# Patient Record
Sex: Male | Born: 1950 | Race: White | Hispanic: No | Marital: Married | Smoking: Never smoker
Health system: Southern US, Community
[De-identification: ages and names within clinical notes are randomized; demographics above are authoritative.]

## PROBLEM LIST (undated history)

## (undated) DIAGNOSIS — R7302 Impaired glucose tolerance (oral): Secondary | ICD-10-CM

## (undated) DIAGNOSIS — E785 Hyperlipidemia, unspecified: Secondary | ICD-10-CM

## (undated) DIAGNOSIS — R339 Retention of urine, unspecified: Secondary | ICD-10-CM

## (undated) DIAGNOSIS — B356 Tinea cruris: Secondary | ICD-10-CM

## (undated) DIAGNOSIS — J069 Acute upper respiratory infection, unspecified: Secondary | ICD-10-CM

## (undated) DIAGNOSIS — M545 Low back pain: Secondary | ICD-10-CM

## (undated) DIAGNOSIS — G4700 Insomnia, unspecified: Secondary | ICD-10-CM

## (undated) DIAGNOSIS — E739 Lactose intolerance, unspecified: Secondary | ICD-10-CM

## (undated) DIAGNOSIS — J019 Acute sinusitis, unspecified: Secondary | ICD-10-CM

## (undated) DIAGNOSIS — R232 Flushing: Secondary | ICD-10-CM

## (undated) DIAGNOSIS — K219 Gastro-esophageal reflux disease without esophagitis: Secondary | ICD-10-CM

## (undated) DIAGNOSIS — R42 Dizziness and giddiness: Secondary | ICD-10-CM

## (undated) DIAGNOSIS — F419 Anxiety disorder, unspecified: Secondary | ICD-10-CM

## (undated) DIAGNOSIS — F329 Major depressive disorder, single episode, unspecified: Secondary | ICD-10-CM

## (undated) DIAGNOSIS — R61 Generalized hyperhidrosis: Secondary | ICD-10-CM

## (undated) DIAGNOSIS — K5730 Diverticulosis of large intestine without perforation or abscess without bleeding: Secondary | ICD-10-CM

## (undated) DIAGNOSIS — R6882 Decreased libido: Secondary | ICD-10-CM

## (undated) DIAGNOSIS — B370 Candidal stomatitis: Secondary | ICD-10-CM

## (undated) DIAGNOSIS — E291 Testicular hypofunction: Secondary | ICD-10-CM

## (undated) DIAGNOSIS — F988 Other specified behavioral and emotional disorders with onset usually occurring in childhood and adolescence: Secondary | ICD-10-CM

## (undated) DIAGNOSIS — R972 Elevated prostate specific antigen [PSA]: Secondary | ICD-10-CM

## (undated) DIAGNOSIS — R109 Unspecified abdominal pain: Secondary | ICD-10-CM

## (undated) DIAGNOSIS — Z8719 Personal history of other diseases of the digestive system: Secondary | ICD-10-CM

## (undated) DIAGNOSIS — Z9089 Acquired absence of other organs: Secondary | ICD-10-CM

## (undated) DIAGNOSIS — R21 Rash and other nonspecific skin eruption: Secondary | ICD-10-CM

## (undated) DIAGNOSIS — N410 Acute prostatitis: Secondary | ICD-10-CM

## (undated) DIAGNOSIS — G4733 Obstructive sleep apnea (adult) (pediatric): Secondary | ICD-10-CM

## (undated) DIAGNOSIS — R300 Dysuria: Secondary | ICD-10-CM

## (undated) DIAGNOSIS — H6690 Otitis media, unspecified, unspecified ear: Secondary | ICD-10-CM

## (undated) DIAGNOSIS — R310 Gross hematuria: Secondary | ICD-10-CM

## (undated) DIAGNOSIS — L280 Lichen simplex chronicus: Secondary | ICD-10-CM

## (undated) DIAGNOSIS — K921 Melena: Secondary | ICD-10-CM

## (undated) HISTORY — DX: Gross hematuria: R31.0

## (undated) HISTORY — PX: HERNIA REPAIR: SHX51

## (undated) HISTORY — DX: Gastro-esophageal reflux disease without esophagitis: K21.9

## (undated) HISTORY — DX: Unspecified abdominal pain: R10.9

## (undated) HISTORY — DX: Tinea cruris: B35.6

## (undated) HISTORY — DX: Rash and other nonspecific skin eruption: R21

## (undated) HISTORY — DX: Low back pain: M54.5

## (undated) HISTORY — DX: Hyperlipidemia, unspecified: E78.5

## (undated) HISTORY — DX: Flushing: R23.2

## (undated) HISTORY — DX: Other specified behavioral and emotional disorders with onset usually occurring in childhood and adolescence: F98.8

## (undated) HISTORY — DX: Acute sinusitis, unspecified: J01.90

## (undated) HISTORY — PX: TONSILLECTOMY: SUR1361

## (undated) HISTORY — DX: Insomnia, unspecified: G47.00

## (undated) HISTORY — DX: Major depressive disorder, single episode, unspecified: F32.9

## (undated) HISTORY — DX: Elevated prostate specific antigen (PSA): R97.20

## (undated) HISTORY — DX: Acute upper respiratory infection, unspecified: J06.9

## (undated) HISTORY — DX: Diverticulosis of large intestine without perforation or abscess without bleeding: K57.30

## (undated) HISTORY — DX: Personal history of other diseases of the digestive system: Z87.19

## (undated) HISTORY — DX: Obstructive sleep apnea (adult) (pediatric): G47.33

## (undated) HISTORY — DX: Dizziness and giddiness: R42

## (undated) HISTORY — PX: CHOLECYSTECTOMY: SHX55

## (undated) HISTORY — DX: Decreased libido: R68.82

## (undated) HISTORY — DX: Acute prostatitis: N41.0

## (undated) HISTORY — DX: Generalized hyperhidrosis: R61

## (undated) HISTORY — PX: EYE SURGERY: SHX253

## (undated) HISTORY — DX: Acquired absence of other organs: Z90.89

## (undated) HISTORY — DX: Melena: K92.1

## (undated) HISTORY — DX: Dysuria: R30.0

## (undated) HISTORY — DX: Testicular hypofunction: E29.1

## (undated) HISTORY — DX: Lichen simplex chronicus: L28.0

## (undated) HISTORY — DX: Otitis media, unspecified, unspecified ear: H66.90

## (undated) HISTORY — DX: Lactose intolerance, unspecified: E73.9

## (undated) HISTORY — DX: Candidal stomatitis: B37.0

## (undated) HISTORY — DX: Impaired glucose tolerance (oral): R73.02

## (undated) HISTORY — DX: Retention of urine, unspecified: R33.9

---
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
## 1985-05-21 HISTORY — PX: VASECTOMY: SHX75

## 2003-05-22 HISTORY — PX: CHOLECYSTECTOMY: SHX55

## 2003-12-02 ENCOUNTER — Encounter: Payer: Self-pay | Admitting: Gastroenterology

## 2003-12-02 ENCOUNTER — Ambulatory Visit (HOSPITAL_COMMUNITY): Admission: RE | Admit: 2003-12-02 | Discharge: 2003-12-02 | Payer: Self-pay | Admitting: Gastroenterology

## 2003-12-08 ENCOUNTER — Encounter: Payer: Self-pay | Admitting: Gastroenterology

## 2003-12-08 ENCOUNTER — Ambulatory Visit (HOSPITAL_COMMUNITY): Admission: RE | Admit: 2003-12-08 | Discharge: 2003-12-08 | Payer: Self-pay | Admitting: Family Medicine

## 2004-02-18 ENCOUNTER — Encounter: Payer: Self-pay | Admitting: Gastroenterology

## 2004-02-18 LAB — HM COLONOSCOPY

## 2004-08-31 ENCOUNTER — Ambulatory Visit: Payer: Self-pay | Admitting: Internal Medicine

## 2004-09-27 ENCOUNTER — Ambulatory Visit: Payer: Self-pay | Admitting: Pulmonary Disease

## 2004-10-01 ENCOUNTER — Encounter: Payer: Self-pay | Admitting: Pulmonary Disease

## 2004-10-01 ENCOUNTER — Ambulatory Visit (HOSPITAL_BASED_OUTPATIENT_CLINIC_OR_DEPARTMENT_OTHER): Admission: RE | Admit: 2004-10-01 | Discharge: 2004-10-01 | Payer: Self-pay | Admitting: Pulmonary Disease

## 2004-10-18 ENCOUNTER — Ambulatory Visit: Payer: Self-pay | Admitting: Pulmonary Disease

## 2004-10-25 ENCOUNTER — Ambulatory Visit: Payer: Self-pay | Admitting: Pulmonary Disease

## 2004-12-11 ENCOUNTER — Ambulatory Visit: Payer: Self-pay | Admitting: Pulmonary Disease

## 2005-04-04 ENCOUNTER — Ambulatory Visit: Payer: Self-pay | Admitting: Gastroenterology

## 2005-10-26 ENCOUNTER — Ambulatory Visit: Payer: Self-pay | Admitting: Internal Medicine

## 2005-10-31 ENCOUNTER — Ambulatory Visit: Payer: Self-pay | Admitting: Internal Medicine

## 2006-01-18 ENCOUNTER — Ambulatory Visit: Payer: Self-pay | Admitting: Internal Medicine

## 2006-02-28 ENCOUNTER — Ambulatory Visit: Payer: Self-pay | Admitting: Internal Medicine

## 2006-03-12 ENCOUNTER — Ambulatory Visit (HOSPITAL_COMMUNITY): Admission: RE | Admit: 2006-03-12 | Discharge: 2006-03-12 | Payer: Self-pay | Admitting: Internal Medicine

## 2006-12-19 ENCOUNTER — Ambulatory Visit: Payer: Self-pay | Admitting: Internal Medicine

## 2006-12-19 LAB — CONVERTED CEMR LAB
Albumin: 4.2 g/dL (ref 3.5–5.2)
BUN: 14 mg/dL (ref 6–23)
Basophils Absolute: 0 10*3/uL (ref 0.0–0.1)
Basophils Relative: 0.7 % (ref 0.0–1.0)
Bilirubin Urine: NEGATIVE
Cholesterol: 187 mg/dL (ref 0–200)
Creatinine, Ser: 1 mg/dL (ref 0.4–1.5)
Eosinophils Absolute: 0.2 10*3/uL (ref 0.0–0.6)
GFR calc Af Amer: 100 mL/min
HDL: 33.5 mg/dL — ABNORMAL LOW (ref >39.0)
Hemoglobin: 14.8 g/dL (ref 13.0–17.0)
Ketones, ur: NEGATIVE mg/dL
Lymphocytes Relative: 22.3 % (ref 12.0–46.0)
MCHC: 35.9 g/dL (ref 30.0–36.0)
Monocytes Absolute: 0.5 10*3/uL (ref 0.2–0.7)
Monocytes Relative: 7.8 % (ref 3.0–11.0)
Neutro Abs: 4.2 10*3/uL (ref 1.4–7.7)
Nitrite: NEGATIVE
PSA: 2.92 ng/mL
PSA: 2.92 ng/mL (ref 0.10–4.00)
Platelets: 288 10*3/uL (ref 150–400)
Potassium: 4.3 meq/L (ref 3.5–5.1)
Sodium: 139 meq/L (ref 135–145)
Specific Gravity, Urine: 1.025 (ref 1.000–1.03)
TSH: 1.09 microintl units/mL (ref 0.35–5.50)
Total Bilirubin: 1.5 mg/dL — ABNORMAL HIGH (ref 0.3–1.2)
Triglycerides: 118 mg/dL (ref 0–149)
Urine Glucose: NEGATIVE mg/dL
Urobilinogen, UA: 1 (ref 0.0–1.0)
VLDL: 24 mg/dL (ref 0–40)
pH: 6 (ref 5.0–8.0)

## 2007-01-07 ENCOUNTER — Ambulatory Visit: Payer: Self-pay | Admitting: Internal Medicine

## 2007-01-17 ENCOUNTER — Encounter: Payer: Self-pay | Admitting: Internal Medicine

## 2007-01-17 DIAGNOSIS — Z8719 Personal history of other diseases of the digestive system: Secondary | ICD-10-CM | POA: Insufficient documentation

## 2007-01-17 DIAGNOSIS — Z9089 Acquired absence of other organs: Secondary | ICD-10-CM | POA: Insufficient documentation

## 2007-01-17 DIAGNOSIS — F3289 Other specified depressive episodes: Secondary | ICD-10-CM

## 2007-01-17 DIAGNOSIS — F988 Other specified behavioral and emotional disorders with onset usually occurring in childhood and adolescence: Secondary | ICD-10-CM

## 2007-01-17 DIAGNOSIS — F329 Major depressive disorder, single episode, unspecified: Secondary | ICD-10-CM

## 2007-01-17 HISTORY — DX: Other specified behavioral and emotional disorders with onset usually occurring in childhood and adolescence: F98.8

## 2007-01-17 HISTORY — DX: Personal history of other diseases of the digestive system: Z87.19

## 2007-01-17 HISTORY — DX: Acquired absence of other organs: Z90.89

## 2007-01-17 HISTORY — DX: Other specified depressive episodes: F32.89

## 2007-01-17 HISTORY — DX: Major depressive disorder, single episode, unspecified: F32.9

## 2007-01-20 DIAGNOSIS — M545 Low back pain, unspecified: Secondary | ICD-10-CM

## 2007-01-20 DIAGNOSIS — E785 Hyperlipidemia, unspecified: Secondary | ICD-10-CM

## 2007-01-20 DIAGNOSIS — K219 Gastro-esophageal reflux disease without esophagitis: Secondary | ICD-10-CM | POA: Insufficient documentation

## 2007-01-20 HISTORY — DX: Hyperlipidemia, unspecified: E78.5

## 2007-01-20 HISTORY — DX: Low back pain, unspecified: M54.50

## 2007-02-07 ENCOUNTER — Ambulatory Visit: Payer: Self-pay | Admitting: Internal Medicine

## 2007-02-07 LAB — CONVERTED CEMR LAB
ALT: 31 units/L (ref 0–53)
AST: 24 units/L (ref 0–37)
Albumin: 4.2 g/dL (ref 3.5–5.2)
HDL: 33.3 mg/dL — ABNORMAL LOW (ref >39.0)
Total Bilirubin: 0.8 mg/dL (ref 0.3–1.2)
VLDL: 15 mg/dL (ref 0–40)

## 2007-03-18 ENCOUNTER — Telehealth (INDEPENDENT_AMBULATORY_CARE_PROVIDER_SITE_OTHER): Payer: Self-pay | Admitting: *Deleted

## 2007-04-03 ENCOUNTER — Telehealth (INDEPENDENT_AMBULATORY_CARE_PROVIDER_SITE_OTHER): Payer: Self-pay | Admitting: *Deleted

## 2007-04-04 ENCOUNTER — Ambulatory Visit: Payer: Self-pay | Admitting: Internal Medicine

## 2007-04-04 DIAGNOSIS — N410 Acute prostatitis: Secondary | ICD-10-CM

## 2007-04-04 DIAGNOSIS — G4733 Obstructive sleep apnea (adult) (pediatric): Secondary | ICD-10-CM | POA: Insufficient documentation

## 2007-04-04 DIAGNOSIS — G4700 Insomnia, unspecified: Secondary | ICD-10-CM | POA: Insufficient documentation

## 2007-04-04 DIAGNOSIS — E739 Lactose intolerance, unspecified: Secondary | ICD-10-CM

## 2007-04-04 DIAGNOSIS — B370 Candidal stomatitis: Secondary | ICD-10-CM

## 2007-04-04 DIAGNOSIS — J019 Acute sinusitis, unspecified: Secondary | ICD-10-CM

## 2007-04-04 HISTORY — DX: Acute prostatitis: N41.0

## 2007-04-04 HISTORY — DX: Obstructive sleep apnea (adult) (pediatric): G47.33

## 2007-04-04 HISTORY — DX: Insomnia, unspecified: G47.00

## 2007-04-04 HISTORY — DX: Lactose intolerance, unspecified: E73.9

## 2007-04-04 HISTORY — DX: Candidal stomatitis: B37.0

## 2007-04-04 HISTORY — DX: Acute sinusitis, unspecified: J01.90

## 2007-04-06 DIAGNOSIS — K5730 Diverticulosis of large intestine without perforation or abscess without bleeding: Secondary | ICD-10-CM | POA: Insufficient documentation

## 2007-04-06 HISTORY — DX: Diverticulosis of large intestine without perforation or abscess without bleeding: K57.30

## 2007-08-07 ENCOUNTER — Telehealth (INDEPENDENT_AMBULATORY_CARE_PROVIDER_SITE_OTHER): Payer: Self-pay | Admitting: *Deleted

## 2007-09-05 ENCOUNTER — Telehealth: Payer: Self-pay | Admitting: Internal Medicine

## 2007-09-30 ENCOUNTER — Ambulatory Visit: Payer: Self-pay | Admitting: Internal Medicine

## 2007-09-30 DIAGNOSIS — R109 Unspecified abdominal pain: Secondary | ICD-10-CM

## 2007-09-30 DIAGNOSIS — R61 Generalized hyperhidrosis: Secondary | ICD-10-CM

## 2007-09-30 HISTORY — DX: Unspecified abdominal pain: R10.9

## 2007-09-30 HISTORY — DX: Generalized hyperhidrosis: R61

## 2007-09-30 LAB — CONVERTED CEMR LAB
Ketones, ur: NEGATIVE mg/dL
Specific Gravity, Urine: 1.025 (ref 1.000–1.03)
Urine Glucose: NEGATIVE mg/dL
pH: 6 (ref 5.0–8.0)

## 2007-10-01 ENCOUNTER — Encounter: Admission: RE | Admit: 2007-10-01 | Discharge: 2007-10-01 | Payer: Self-pay | Admitting: Internal Medicine

## 2008-01-08 ENCOUNTER — Ambulatory Visit: Payer: Self-pay | Admitting: Internal Medicine

## 2008-01-08 ENCOUNTER — Telehealth (INDEPENDENT_AMBULATORY_CARE_PROVIDER_SITE_OTHER): Payer: Self-pay | Admitting: *Deleted

## 2008-01-09 LAB — CONVERTED CEMR LAB
ALT: 32 units/L (ref 0–53)
Basophils Absolute: 0.1 10*3/uL (ref 0.0–0.1)
Bilirubin Urine: NEGATIVE
Bilirubin, Direct: 0.2 mg/dL (ref 0.0–0.3)
CO2: 28 meq/L (ref 19–32)
Calcium: 8.7 mg/dL (ref 8.4–10.5)
HDL: 28.7 mg/dL — ABNORMAL LOW (ref >39.0)
Hemoglobin, Urine: NEGATIVE
Hemoglobin: 14.5 g/dL (ref 13.0–17.0)
Ketones, ur: NEGATIVE mg/dL
LDL Cholesterol: 67 mg/dL (ref 0–99)
Leukocytes, UA: NEGATIVE
Lymphocytes Relative: 23 % (ref 12.0–46.0)
MCHC: 35.8 g/dL (ref 30.0–36.0)
Neutro Abs: 3.3 10*3/uL (ref 1.4–7.7)
Neutrophils Relative %: 62.6 % (ref 43.0–77.0)
PSA: 2.03 ng/mL (ref 0.10–4.00)
RDW: 11.9 % (ref 11.5–14.6)
Sodium: 139 meq/L (ref 135–145)
TSH: 1.21 microintl units/mL (ref 0.35–5.50)
Total Bilirubin: 0.8 mg/dL (ref 0.3–1.2)
Total CHOL/HDL Ratio: 4.1
Triglycerides: 108 mg/dL (ref 0–149)
Urobilinogen, UA: 0.2 (ref 0.0–1.0)
VLDL: 22 mg/dL (ref 0–40)

## 2008-01-14 ENCOUNTER — Ambulatory Visit: Payer: Self-pay | Admitting: Internal Medicine

## 2008-01-15 ENCOUNTER — Telehealth (INDEPENDENT_AMBULATORY_CARE_PROVIDER_SITE_OTHER): Payer: Self-pay | Admitting: *Deleted

## 2008-05-04 ENCOUNTER — Telehealth (INDEPENDENT_AMBULATORY_CARE_PROVIDER_SITE_OTHER): Payer: Self-pay | Admitting: *Deleted

## 2008-05-24 ENCOUNTER — Telehealth (INDEPENDENT_AMBULATORY_CARE_PROVIDER_SITE_OTHER): Payer: Self-pay | Admitting: *Deleted

## 2008-05-31 ENCOUNTER — Ambulatory Visit: Payer: Self-pay | Admitting: Internal Medicine

## 2008-05-31 DIAGNOSIS — B356 Tinea cruris: Secondary | ICD-10-CM

## 2008-05-31 DIAGNOSIS — L280 Lichen simplex chronicus: Secondary | ICD-10-CM

## 2008-05-31 HISTORY — DX: Tinea cruris: B35.6

## 2008-05-31 HISTORY — DX: Lichen simplex chronicus: L28.0

## 2008-07-09 ENCOUNTER — Telehealth (INDEPENDENT_AMBULATORY_CARE_PROVIDER_SITE_OTHER): Payer: Self-pay | Admitting: *Deleted

## 2008-07-26 ENCOUNTER — Ambulatory Visit: Payer: Self-pay | Admitting: Internal Medicine

## 2008-07-26 DIAGNOSIS — K921 Melena: Secondary | ICD-10-CM

## 2008-07-26 HISTORY — DX: Melena: K92.1

## 2008-12-02 ENCOUNTER — Ambulatory Visit: Payer: Self-pay | Admitting: Internal Medicine

## 2008-12-02 DIAGNOSIS — R6882 Decreased libido: Secondary | ICD-10-CM

## 2008-12-02 HISTORY — DX: Decreased libido: R68.82

## 2008-12-03 LAB — CONVERTED CEMR LAB
ALT: 45 units/L (ref 0–53)
AST: 35 units/L (ref 0–37)
BUN: 15 mg/dL (ref 6–23)
Basophils Relative: 0.5 % (ref 0.0–3.0)
Chloride: 103 meq/L (ref 96–112)
Cholesterol: 147 mg/dL (ref 0–200)
Eosinophils Relative: 2.5 % (ref 0.0–5.0)
FSH: 10.1 milliintl units/mL (ref 1.4–18.1)
HCT: 41.8 % (ref 39.0–52.0)
Hemoglobin, Urine: NEGATIVE
Hemoglobin: 14.7 g/dL (ref 13.0–17.0)
Leukocytes, UA: NEGATIVE
Lymphs Abs: 1.2 10*3/uL (ref 0.7–4.0)
MCV: 98.2 fL (ref 78.0–100.0)
Monocytes Absolute: 0.4 10*3/uL (ref 0.1–1.0)
Nitrite: NEGATIVE
PSA: 2.57 ng/mL (ref 0.10–4.00)
Potassium: 4 meq/L (ref 3.5–5.1)
RBC: 4.25 M/uL (ref 4.22–5.81)
Sodium: 138 meq/L (ref 135–145)
Testosterone: 177.46 ng/dL — ABNORMAL LOW (ref 350.00–890.00)
Total CHOL/HDL Ratio: 4
Total Protein, Urine: NEGATIVE mg/dL
Total Protein: 7.5 g/dL (ref 6.0–8.3)
Triglycerides: 120 mg/dL (ref 0.0–149.0)
WBC: 5.5 10*3/uL (ref 4.5–10.5)
pH: 6 (ref 5.0–8.0)

## 2008-12-06 ENCOUNTER — Telehealth (INDEPENDENT_AMBULATORY_CARE_PROVIDER_SITE_OTHER): Payer: Self-pay | Admitting: *Deleted

## 2008-12-13 ENCOUNTER — Telehealth (INDEPENDENT_AMBULATORY_CARE_PROVIDER_SITE_OTHER): Payer: Self-pay | Admitting: *Deleted

## 2009-01-27 ENCOUNTER — Encounter: Payer: Self-pay | Admitting: Internal Medicine

## 2009-01-27 ENCOUNTER — Ambulatory Visit: Payer: Self-pay | Admitting: Internal Medicine

## 2009-01-27 DIAGNOSIS — R310 Gross hematuria: Secondary | ICD-10-CM

## 2009-01-27 DIAGNOSIS — R339 Retention of urine, unspecified: Secondary | ICD-10-CM

## 2009-01-27 DIAGNOSIS — R300 Dysuria: Secondary | ICD-10-CM

## 2009-01-27 HISTORY — DX: Retention of urine, unspecified: R33.9

## 2009-01-27 HISTORY — DX: Dysuria: R30.0

## 2009-01-27 HISTORY — DX: Gross hematuria: R31.0

## 2009-01-27 LAB — CONVERTED CEMR LAB
BUN: 16 mg/dL (ref 6–23)
CO2: 29 meq/L (ref 19–32)
Creatinine, Ser: 1 mg/dL (ref 0.4–1.5)
Nitrite: NEGATIVE
Potassium: 3.9 meq/L (ref 3.5–5.1)
Sodium: 137 meq/L (ref 135–145)
Total Protein, Urine: NEGATIVE mg/dL
Urine Glucose: NEGATIVE mg/dL
pH: 5.5 (ref 5.0–8.0)

## 2009-01-28 ENCOUNTER — Encounter: Payer: Self-pay | Admitting: Internal Medicine

## 2009-01-28 ENCOUNTER — Telehealth (INDEPENDENT_AMBULATORY_CARE_PROVIDER_SITE_OTHER): Payer: Self-pay | Admitting: *Deleted

## 2009-02-01 ENCOUNTER — Telehealth: Payer: Self-pay | Admitting: Internal Medicine

## 2009-02-01 ENCOUNTER — Ambulatory Visit: Payer: Self-pay | Admitting: Cardiology

## 2009-04-05 ENCOUNTER — Telehealth: Payer: Self-pay | Admitting: Internal Medicine

## 2009-04-12 ENCOUNTER — Ambulatory Visit: Payer: Self-pay | Admitting: Internal Medicine

## 2009-06-13 ENCOUNTER — Ambulatory Visit: Payer: Self-pay | Admitting: Internal Medicine

## 2009-06-13 DIAGNOSIS — H6690 Otitis media, unspecified, unspecified ear: Secondary | ICD-10-CM | POA: Insufficient documentation

## 2009-06-13 DIAGNOSIS — E291 Testicular hypofunction: Secondary | ICD-10-CM

## 2009-06-13 DIAGNOSIS — R42 Dizziness and giddiness: Secondary | ICD-10-CM | POA: Insufficient documentation

## 2009-06-13 HISTORY — DX: Testicular hypofunction: E29.1

## 2009-06-13 HISTORY — DX: Otitis media, unspecified, unspecified ear: H66.90

## 2009-06-13 HISTORY — DX: Dizziness and giddiness: R42

## 2009-06-20 ENCOUNTER — Telehealth: Payer: Self-pay | Admitting: Internal Medicine

## 2009-06-30 ENCOUNTER — Telehealth: Payer: Self-pay | Admitting: Internal Medicine

## 2009-07-14 ENCOUNTER — Telehealth (INDEPENDENT_AMBULATORY_CARE_PROVIDER_SITE_OTHER): Payer: Self-pay | Admitting: *Deleted

## 2009-08-26 ENCOUNTER — Ambulatory Visit: Payer: Self-pay | Admitting: Internal Medicine

## 2009-08-26 DIAGNOSIS — R21 Rash and other nonspecific skin eruption: Secondary | ICD-10-CM | POA: Insufficient documentation

## 2009-08-26 DIAGNOSIS — R232 Flushing: Secondary | ICD-10-CM | POA: Insufficient documentation

## 2009-08-26 HISTORY — DX: Rash and other nonspecific skin eruption: R21

## 2009-08-26 HISTORY — DX: Flushing: R23.2

## 2009-09-03 ENCOUNTER — Encounter: Payer: Self-pay | Admitting: Internal Medicine

## 2009-09-21 ENCOUNTER — Encounter: Payer: Self-pay | Admitting: Internal Medicine

## 2009-09-28 ENCOUNTER — Encounter: Payer: Self-pay | Admitting: Internal Medicine

## 2009-11-04 ENCOUNTER — Telehealth: Payer: Self-pay | Admitting: Internal Medicine

## 2009-11-16 ENCOUNTER — Ambulatory Visit: Payer: Self-pay | Admitting: Internal Medicine

## 2009-11-16 LAB — CONVERTED CEMR LAB
ALT: 40 units/L (ref 0–53)
Bilirubin, Direct: 0.2 mg/dL (ref 0.0–0.3)
CO2: 30 meq/L (ref 19–32)
Chloride: 108 meq/L (ref 96–112)
Eosinophils Relative: 3.8 % (ref 0.0–5.0)
GFR calc non Af Amer: 83.45 mL/min (ref >60)
Glucose, Bld: 108 mg/dL — ABNORMAL HIGH (ref 70–99)
HDL: 35.7 mg/dL — ABNORMAL LOW (ref >39.00)
Ketones, ur: NEGATIVE mg/dL
Leukocytes, UA: NEGATIVE
MCV: 99.1 fL (ref 78.0–100.0)
Monocytes Absolute: 0.4 10*3/uL (ref 0.1–1.0)
Neutrophils Relative %: 63.9 % (ref 43.0–77.0)
Nitrite: NEGATIVE
PSA: 3.31 ng/mL (ref 0.10–4.00)
Platelets: 234 10*3/uL (ref 150.0–400.0)
Potassium: 4.3 meq/L (ref 3.5–5.1)
Sodium: 142 meq/L (ref 135–145)
Specific Gravity, Urine: >=1.03 (ref 1.000–1.030)
Total Bilirubin: 1 mg/dL (ref 0.3–1.2)
Total CHOL/HDL Ratio: 4
VLDL: 24 mg/dL (ref 0.0–40.0)
WBC: 5.4 10*3/uL (ref 4.5–10.5)
pH: 5.5 (ref 5.0–8.0)

## 2009-11-18 ENCOUNTER — Ambulatory Visit: Payer: Self-pay | Admitting: Internal Medicine

## 2009-11-18 DIAGNOSIS — R972 Elevated prostate specific antigen [PSA]: Secondary | ICD-10-CM

## 2009-11-18 HISTORY — DX: Elevated prostate specific antigen (PSA): R97.20

## 2010-01-03 ENCOUNTER — Telehealth (INDEPENDENT_AMBULATORY_CARE_PROVIDER_SITE_OTHER): Payer: Self-pay | Admitting: *Deleted

## 2010-01-04 ENCOUNTER — Ambulatory Visit: Payer: Self-pay | Admitting: Pulmonary Disease

## 2010-01-10 ENCOUNTER — Telehealth: Payer: Self-pay | Admitting: Pulmonary Disease

## 2010-01-13 ENCOUNTER — Encounter: Payer: Self-pay | Admitting: Internal Medicine

## 2010-02-04 ENCOUNTER — Encounter: Payer: Self-pay | Admitting: Pulmonary Disease

## 2010-02-06 ENCOUNTER — Telehealth (INDEPENDENT_AMBULATORY_CARE_PROVIDER_SITE_OTHER): Payer: Self-pay | Admitting: *Deleted

## 2010-02-15 ENCOUNTER — Telehealth: Payer: Self-pay | Admitting: Internal Medicine

## 2010-02-17 ENCOUNTER — Telehealth: Payer: Self-pay | Admitting: Internal Medicine

## 2010-04-03 ENCOUNTER — Telehealth: Payer: Self-pay | Admitting: Internal Medicine

## 2010-04-13 ENCOUNTER — Encounter: Payer: Self-pay | Admitting: Pulmonary Disease

## 2010-05-30 ENCOUNTER — Telehealth: Payer: Self-pay | Admitting: Internal Medicine

## 2010-06-10 ENCOUNTER — Encounter: Payer: Self-pay | Admitting: Internal Medicine

## 2010-06-21 NOTE — Progress Notes (Signed)
Summary: refill request  Phone Note Call from Patient Call back at Home Phone [phone] Call back at Work Phone [phone]   Caller: Patient Summary of Call: Patient called lmovm stating that [facility] has sent two refill request for ( sleep med) but have not heard anything.Marland KitchenMarland KitchenAlvy Beal Archie CMA  February 15, 2010 10:43 AM   Follow-up for Phone Call        called pt left msg. to call back Follow-up by: Robin Ewing CMA Duncan Dull),  February 15, 2010 10:46 AM  Additional Follow-up for Phone Call Additional follow up Details #1::        called pt back informed Ambien sent in on 11/07/2009 #90 with 2 refills to [facility]. Additional Follow-up by: Robin Ewing CMA (AAMA),  February 15, 2010 11:20 AM

## 2010-06-21 NOTE — Consult Note (Signed)
Summary: [facility] [place]   Imported By: Sherian Rein 09/28/2009 08:51:04  _____________________________________________________________________  External Attachment:    Type:   Image     Comment:   External Document

## 2010-06-21 NOTE — Miscellaneous (Signed)
Summary: very poor compliance on auto download   Clinical Lists Changes  auto shows only 50% compliance, and only for less than 4 hours each day  Appended Document: very poor compliance on auto download megan, pt has terrible compliance on his auto study.  Needs ov to discuss issues.  Appended Document: very poor compliance on auto download lmomtcb x1  Appended Document: very poor compliance on auto download lmomtcb x2  Appended Document: very poor compliance on auto download called and spoke with pt. pt is scheduled to come in on 9/28 at 1:30.

## 2010-06-21 NOTE — Progress Notes (Signed)
Summary: prescript for cpap   Phone Note Call from Patient Call back at [phone]   Caller: Patient Call For: clance Summary of Call: [facility]. Initial call taken by: Rickard Patience,  January 03, 2010 10:29 AM  Follow-up for Phone Call        pt hasn't seen [facility] since 12-11-2004.  needs ov first before MD will send orders for cpap or cpap supplies.  pt was ok with this.  pt scheduled to see [facility] tomorrow 01-04-2010 at noon.Arman Filter LPN  January 03, 2010 10:45 AM

## 2010-06-21 NOTE — Assessment & Plan Note (Signed)
Summary: PER PT DIZZINESS-EAR PROBLEM-IF WORSEN ER/URG--STC   Vital Signs:  Patient profile:   60 year old male Height:      74 inches Weight:      254 pounds BMI:     32.73 O2 Sat:      96 % on Room air Temp:     98.4 degrees F oral Pulse rate:   81 / minute BP sitting:   130 / 78  (left arm) Cuff size:   large  Vitals Entered ByMarland Kitchen Zella Ball Ewing (June 13, 2009 11:18 AM)  O2 Flow:  Room air  CC: dizzy, nauseated, HA's,loss of appetite/RE   CC:  dizzy, nauseated, HA's, and loss of appetite/RE.  History of Present Illness: here with 2 wks onset ST and sinus ocngesiton , aas well as ear fullness, vertigo and headhaces;  had bloody d/c to start with now improved;  no hchills, had some dry cough ; Pt denies CP, sob, doe, wheezing, orthopnea, pnd, worsening LE edema, palps, dizziness or syncope .  Appetitie down as well in past 2 wks but no wt change. Pt denies new neuro symptoms such as headache, facial or extremity weakness   Requests adderall today, seems to work well, no unintentional wt loss or other side effects at this time.    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) 12)  Preventive Health Care  (ICD-V70.0) 13)  Sweating  (ICD-780.8) 14)  Flank Pain, Left  (ICD-789.09) 15)  Insomnia  (ICD-780.52) 16)  Thrush  (ICD-112.0) 17)  Acute Prostatitis  (ICD-601.0) 18)  Diverticulosis, Colon  (ICD-562.10) 19)  Obstructive Sleep Apnea  (ICD-327.23) 20)  Glucose Intolerance  (ICD-271.3) 21)  Sinusitis- Acute-nos  (ICD-461.9) 22)  Family History Diabetes 1st Degree Relative  (ICD-V18.0) 23)   Family History of Cad Male 1st Degree Relative <50  (ICD-V17.3) 24)  Low Back Pain  (ICD-724.2) 25)  Gerd  (ICD-530.81) 26)  Hyperlipidemia  (ICD-272.4) 27)  Add  (ICD-314.00) 28)  Pancreatitis, Hx of  (ICD-V12.70) 29)  Tonsillectomy and Adenoidectomy, Hx of  (ICD-V45.79) 30)  Depression  (ICD-311)  Medications Prior to Update: 1)  Adderall 20 Mg  Tabs (Amphetamine-Dextroamphetamine) .Marland Kitchen.. 1 By Mouth Two Times A Day - To Fill Jun 04, 2009 2)  Sertraline Hcl 100 Mg Tabs (Sertraline Hcl) .Marland Kitchen.. 1po Once Daily 3)  Zolpidem Tartrate 10 Mg  Tabs (Zolpidem Tartrate) .Marland Kitchen.. 1 By Mouth At Bedtime As Needed 4)  Lovastatin 20 Mg  Tabs (Lovastatin) .Marland Kitchen.. 1po Once Daily 5)  Adult Aspirin Ec Low Strength 81 Mg  Tbec (Aspirin) .Marland Kitchen.. 1 By Mouth Qd 6)  Advil 200 Mg  Tabs (Ibuprofen) .... 2 To 3 By Mouth Once Daily As Needed Backi Pain 7)  Cipro 500 Mg Tabs (Ciprofloxacin Hcl) .Marland Kitchen.. 1 By Mouth Two Times A Day 8)  Tussicaps 10-8 Mg Xr12h-Cap (Hydrocod Polst-Chlorphen Polst) .Marland Kitchen.. 1po Two Times A Day As Needed 9)  Gnc Live Well Over 50 Vitamin Pack .Marland Kitchen.. 1 By Mouth Once Daily 10)  Proctofoam Hc 1-1 % Foam (Hydrocortisone Ace-Pramoxine) .... Use Asd Two Times A Day As Needed 11)  Androgel  50 Mg/5gm Gel (Testosterone) .... Use Asd 1 Once Daily 12)  Flomax 0.4 Mg Caps (Tamsulosin Hcl) .Marland Kitchen.. 1 By Mouth Once Daily (Generic) 13)  Clarithromycin 500 Mg Tabs (Clarithromycin) .Marland Kitchen.. 1 By Mouth Two Times A Day  Current Medications (verified): 1)  Adderall 20 Mg  Tabs (Amphetamine-Dextroamphetamine) .Marland Kitchen.. 1 By Mouth Two Times A Day - To Fill Sep 02, 2009 2)  Sertraline Hcl 100 Mg Tabs (Sertraline Hcl) .Marland Kitchen.. 1po Once Daily 3)  Zolpidem Tartrate 10 Mg  Tabs (Zolpidem Tartrate) .Marland Kitchen.. 1 By Mouth At Bedtime As Needed 4)  Lovastatin 20 Mg  Tabs (Lovastatin) .Marland Kitchen.. 1po Once Daily 5)  Adult Aspirin Ec Low Strength 81 Mg  Tbec (Aspirin) .Marland Kitchen.. 1 By Mouth Qd 6)  Advil 200 Mg  Tabs (Ibuprofen) .... 2 To 3 By Mouth Once Daily As Needed Backi  Pain 7)  Gnc Live Well Over 50 Vitamin Pack .Marland Kitchen.. 1 By Mouth Once Daily 8)  Androgel 50 Mg/5gm Gel (Testosterone) .... Use Asd 1 Once Daily 9)  Flomax 0.4 Mg Caps (Tamsulosin Hcl) .Marland Kitchen.. 1 By Mouth Once Daily (Generic) 10)  Azithromycin 250 Mg Tabs (Azithromycin) .... 2po Qd For 1 Day, Then 1po Qd For 4days, Then Stop 11)  Meclizine Hcl 12.5 Mg Tabs (Meclizine Hcl) .Marland Kitchen.. 1 -2 By Mouth Q 6 Hrs As Needed Dizziness  Allergies (verified): 1)  Pravachol (Pravastatin Sodium)  Past History:  Past Medical History: Last updated: 12/24/2008 Depression Recurrent Pancreatitis ADD GERD Low back pain Hyperlipidemia glucose intolerance OSA Diverticulosis, colon Sleep Apnea  Past Surgical History: Last updated: 12/24/2008 Cholecystectomy - 2005 Vasectomy 1987 hernia repair 1957 Tonsillectomy  Social History: Last updated: 09/30/2007 Never Smoked Alcohol use-yes work - Estate agent Married 3 children  Risk Factors: Alcohol Use: 1 (05/31/2008) Exercise: no (05/31/2008)  Risk Factors: Smoking Status: never (04/04/2007) Passive Smoke Exposure: no (05/31/2008)  Review of Systems       all otherwise negative per pt -  Physical Exam  General:  alert and overweight-appearing.  , mild ill  Head:  normocephalic and atraumatic.   Eyes:  vision grossly intact, pupils equal, and pupils round.   Ears:  bilat tm's erythema, with post fluid;  sinus nontender Nose:  nasal dischargemucosal pallor and mucosal erythema.   Mouth:  pharyngeal erythema and fair dentition.   Neck:  supple and no masses.   Lungs:  normal respiratory effort and normal breath sounds.   Heart:  normal rate and regular rhythm.   Extremities:  no edema, no erythema  Neurologic:  cranial nerves II-XII intact, strength normal in all extremities, and finger-to-nose normal.  , no nystagmus   Impression & Recommendations:  Problem # 1:  OTITIS MEDIA, ACUTE, BILATERAL (ICD-382.9)  The following medications  were removed from the medication list:    Cipro 500 Mg Tabs (Ciprofloxacin hcl) .Marland Kitchen... 1 by mouth two times a day His updated medication list for this problem includes:    Adult Aspirin Ec Low Strength 81 Mg Tbec (Aspirin) .Marland Kitchen... 1 by mouth qd    Advil 200 Mg Tabs (Ibuprofen) .Marland Kitchen... 2 to 3 by mouth once daily as needed backi pain    Azithromycin 250 Mg Tabs (Azithromycin) .Marland Kitchen... 2po qd for 1 day, then 1po qd for 4days, then stop treat as above, f/u any worsening signs or symptoms   Problem # 2:  VERTIGO (ICD-780.4)  His updated medication list for this problem includes:    Meclizine Hcl 12.5 Mg Tabs (Meclizine hcl) .Marland Kitchen... 1 -2  by mouth q 6 hrs as needed dizziness treat as above, f/u any worsening signs or symptoms   Problem # 3:  ADD (ICD-314.00) stable overall by hx and exam, ok to continue meds/tx as is - refilled meds today  Problem # 4:  HYPOGONADISM (ICD-257.2) d/w pt - he is interested in pill form of testosterone, but as we discussed this is not an option at this time; to cont meds as is  Complete Medication List: 1)  Adderall 20 Mg Tabs (Amphetamine-dextroamphetamine) .Marland Kitchen.. 1 by mouth two times a day - to fill Sep 02, 2009 2)  Sertraline Hcl 100 Mg Tabs (Sertraline hcl) .Marland Kitchen.. 1po once daily 3)  Zolpidem Tartrate 10 Mg Tabs (Zolpidem tartrate) .Marland Kitchen.. 1 by mouth at bedtime as needed 4)  Lovastatin 20 Mg Tabs (Lovastatin) .Marland Kitchen.. 1po once daily 5)  Adult Aspirin Ec Low Strength 81 Mg Tbec (Aspirin) .Marland Kitchen.. 1 by mouth qd 6)  Advil 200 Mg Tabs (Ibuprofen) .... 2 to 3 by mouth once daily as needed backi pain 7)  Gnc Live Well Over 50 Vitamin Pack  .Marland KitchenMarland Kitchen. 1 by mouth once daily 8)  Androgel 50 Mg/5gm Gel (Testosterone) .... Use asd 1 once daily 9)  Flomax 0.4 Mg Caps (Tamsulosin hcl) .Marland Kitchen.. 1 by mouth once daily (generic) 10)  Azithromycin 250 Mg Tabs (Azithromycin) .... 2po qd for 1 day, then 1po qd for 4days, then stop 11)  Meclizine Hcl 12.5 Mg Tabs (Meclizine hcl) .Marland Kitchen.. 1 -2 by mouth q 6 hrs as needed  dizziness  Patient Instructions: 1)  Please take all new medications as prescribed 2)  Continue all previous medications as before this visit  3)  Please schedule a follow-up appointment in 6 months with CPX labs Prescriptions: MECLIZINE HCL 12.5 MG TABS (MECLIZINE HCL) 1 -2 by mouth q 6 hrs as needed dizziness  #50 x 1   Entered and Authorized by:   Corwin Levins MD   Signed by:   Corwin Levins MD on 06/13/2009   Method used:   Print then Give to Patient   RxID:   [id number] ADDERALL 20 MG  TABS (AMPHETAMINE-DEXTROAMPHETAMINE) 1 by mouth two times a day - to fill Sep 02, 2009  #60 x 0   Entered and Authorized by:   Corwin Levins MD   Signed by:   Corwin Levins MD on 06/13/2009   Method used:   Print then Give to Patient   RxID:   [id number] ADDERALL 20 MG  TABS (AMPHETAMINE-DEXTROAMPHETAMINE) 1 by mouth two times a day - to fill Aug 03, 2009  #60 x 0   Entered and Authorized by:   Corwin Levins MD   Signed by:   Corwin Levins MD on 06/13/2009   Method used:   Print then Give to Patient   RxID:   [id number] ADDERALL 20 MG  TABS (AMPHETAMINE-DEXTROAMPHETAMINE) 1 by mouth two times a day - to fill Jul 04, 2009  #60 x 0   Entered and Authorized by:   Corwin Levins MD   Signed by:   Corwin Levins MD on 06/13/2009   Method used:   Print then Give to Patient   RxID:   [id number] AZITHROMYCIN 250 MG TABS (AZITHROMYCIN) 2po qd for 1 day, then 1po qd for 4days, then stop  #6 x 1   Entered and Authorized by:   Corwin Levins MD   Signed by:   Corwin Levins MD on 06/13/2009  Method used:   Print then Give to Patient   RxID:   [id number]

## 2010-06-21 NOTE — Miscellaneous (Signed)
[facility]   Imported By: Lester Altona 08/30/2009 08:44:11  _____________________________________________________________________  External Attachment:    Type:   Image     Comment:   External Document

## 2010-06-21 NOTE — Progress Notes (Signed)
Summary: Rx refill req  Phone Note Call from Patient   Caller: Paul Tucker @ [phone] Call For: Corwin Levins MD Reason for Call: Talk to Doctor Summary of Call: Pt wife states [facility] did not recieved Ambien rx that was fax back in June. Pt is requesting a 90 rx to be sent to [facility], and a #15 to be sent to local pharmacy. ([facility]). Pls advise Initial call taken by: Orlan Leavens RMA,  February 17, 2010 4:03 PM  Follow-up for Phone Call        done hardcopy to LIM side B - dahlia   Additional Follow-up for Phone Call Additional follow up Details #1::        [facility] [address] [facility] Additional Follow-up by: Margaret Pyle, CMA,  February 20, 2010 7:55 AM    New/Updated Medications: ZOLPIDEM TARTRATE 10 MG  TABS (ZOLPIDEM TARTRATE) 1 by mouth at bedtime as needed Prescriptions: ZOLPIDEM TARTRATE 10 MG  TABS (ZOLPIDEM TARTRATE) 1 by mouth at bedtime as needed  #15 x 0   Entered and Authorized by:   Corwin Levins MD   Signed by:   Corwin Levins MD on 02/17/2010   Method used:   Print then Give to Patient   RxID:   [id number] ZOLPIDEM TARTRATE 10 MG  TABS (ZOLPIDEM TARTRATE) 1 by mouth at bedtime as needed  #90 x 1   Entered and Authorized by:   Corwin Levins MD   Signed by:   Corwin Levins MD on 02/17/2010   Method used:   Print then Give to Patient   RxID:   [id number]

## 2010-06-21 NOTE — Progress Notes (Signed)
Summary: Adderall  Phone Note Call from Patient Call back at Home Phone [phone] Call back at Work Phone [phone]   Caller: Patient Summary of Call: Pt called to inform MD that pharmacy is out of stock of his dose of Adderall. Pharmacy advised pt that 30mg  XR is available and pt is requesting to change until normal dosage is available begining of 2012. Pt will need RX. Initial call taken by: Margaret Pyle, CMA,  April 03, 2010 8:31 AM  Follow-up for Phone Call        done hardcopy to LIM side B - dahlia  Follow-up by: Corwin Levins MD,  April 03, 2010 12:09 PM  Additional Follow-up for Phone Call Additional follow up Details #1::        Pt informed, Rx in cabinet for pt pick up Additional Follow-up by: Margaret Pyle, CMA,  April 03, 2010 12:46 PM    New/Updated Medications: ADDERALL XR 30 MG XR24H-CAP (AMPHETAMINE-DEXTROAMPHETAMINE) generic - 1 by mouth once daily  - to fill Apr 03, 2010 ADDERALL XR 30 MG XR24H-CAP (AMPHETAMINE-DEXTROAMPHETAMINE) generic - 1 by mouth once daily  - to fill May 03, 2010 Prescriptions: ADDERALL XR 30 MG XR24H-CAP (AMPHETAMINE-DEXTROAMPHETAMINE) generic - 1 by mouth once daily  - to fill May 03, 2010  #30 x 0   Entered and Authorized by:   Corwin Levins MD   Signed by:   Corwin Levins MD on 04/03/2010   Method used:   Print then Give to Patient   RxID:   [id number] ADDERALL XR 30 MG XR24H-CAP (AMPHETAMINE-DEXTROAMPHETAMINE) generic - 1 by mouth once daily  - to fill Apr 03, 2010  #30 x 0   Entered and Authorized by:   Corwin Levins MD   Signed by:   Corwin Levins MD on 04/03/2010   Method used:   Print then Give to Patient   RxID:   [id number]

## 2010-06-21 NOTE — Letter (Signed)
Summary: Katy Fitch Sypher MD  Katy Fitch Sypher MD   Imported By: Lester Lopeno 10/05/2009 09:34:27  _____________________________________________________________________  External Attachment:    Type:   Image     Comment:   External Document

## 2010-06-21 NOTE — Assessment & Plan Note (Signed)
Summary: self referral for management of osa   Copy to:  Self Referral Primary Provider/Referring Provider:  Corwin Levins MD  CC:  Sleep Consult. Former pt. Last seen 2006.Marland Kitchen  History of Present Illness: The pt is a 60 y/o male who comes in today as a self referral for management of osa.  The pt was diagnosed with osa in 2006, with AHI of 74/hr and desat as low as 74%.  He has been treated with a cpap of 12cm as determined by an auto titration device.  The pt had significant mask leak issues, and was limited in choice by requiring a full face mask due to mouth opening.  He stopped using the cpap about 3 years ago, and has had worsening sleep issues since.  He has been noted to have snoring and pauses in his breathing, and has nonrestorative sleep despite getting greater than 7hrs of sleep a night.  He has significant daytime sleepiness with periods of inactivity, and her epworth score today is 16.  He feels his weight is neutral over the past 2 years.    Medications Prior to Update: 1)  Adderall 20 Mg  Tabs (Amphetamine-Dextroamphetamine) .Marland Kitchen.. 1 By Mouth Two Times A Day - To Fill December 01, 2009 2)  Sertraline Hcl 100 Mg Tabs (Sertraline Hcl) .Marland Kitchen.. 1po Once Daily 3)  Zolpidem Tartrate 10 Mg  Tabs (Zolpidem Tartrate) .Marland Kitchen.. 1 By Mouth At Bedtime As Needed 4)  Lovastatin 20 Mg  Tabs (Lovastatin) .Marland Kitchen.. 1po Once Daily 5)  Adult Aspirin Ec Low Strength 81 Mg  Tbec (Aspirin) .Marland Kitchen.. 1 By Mouth Qd 6)  Advil 200 Mg  Tabs (Ibuprofen) .... 2 To 3 By Mouth Once Daily As Needed Backi Pain 7)  Androgel 50 Mg/5gm Gel (Testosterone) .... Use Asd 1 Once Daily 8)  Flomax 0.4 Mg Caps (Tamsulosin Hcl) .Marland Kitchen.. 1 By Mouth Once Daily (Generic) 9)  Mometasone Furoate 0.1 % Crea (Mometasone Furoate) .... Use Asd Two Times A Day As Needed  Allergies (verified): 1)  Pravachol (Pravastatin Sodium)  Past History:  Past Medical History: Depression Recurrent Pancreatitis ADD GERD Low back pain Hyperlipidemia glucose  intolerance OSA--AHI 74/hr in 2006. Diverticulosis, colon  Past Surgical History: Reviewed history from 12/24/2008 and no changes required. Cholecystectomy - 2005 Vasectomy 1987 hernia repair 1957 Tonsillectomy  Family History: Reviewed history from 04/04/2007 and no changes required. Family History of CAD Male 1st degree relative Mother with Hodgkins - died at 48yo Family History Diabetes 1st degree relative emphysema: father heart disease: father   Social History: Reviewed history from 11/18/2009 and no changes required. 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 3 children Drug use-no  Review of Systems  The patient denies shortness of breath with activity, shortness of breath at rest, productive cough, non-productive cough, coughing up blood, chest pain, irregular heartbeats, acid heartburn, indigestion, loss of appetite, weight change, abdominal pain, difficulty swallowing, sore throat, tooth/dental problems, headaches, nasal congestion/difficulty breathing through nose, sneezing, itching, ear ache, anxiety, depression, hand/feet swelling, joint stiffness or pain, rash, change in color of mucus, and fever.    Vital Signs:  Patient profile:   60 year old male Height:      74 inches Weight:      259.38 pounds BMI:     33.42 O2 Sat:      92 % on Room air Temp:     98.2 degrees F oral Pulse rate:   88 / minute BP sitting:  122 / 78  (left arm) Cuff size:   large  Vitals Entered By: Arman Filter LPN (January 04, 2010 12:08 PM)  O2 Flow:  Room air CC: Sleep Consult. Former pt. Last seen 2006. Comments Medications reviewed with patient Arman Filter LPN  January 04, 2010 12:11 PM    Physical Exam  General:  ow male in nad Eyes:  PERRLA and EOMI.   Nose:  deviated septum to left with narrowing, but patent Mouth:  elongation of soft palate and uvula with narrowing posteriorly Neck:  no jvd, tmg, LN Lungs:  clear to  auscultation Heart:  rrr, no mrg Abdomen:  soft and nontender, bs+ Extremities:  no edema noted or cyanosis  pulses intact distally. Neurologic:  alert and oriented, moves all 4.   Impression & Recommendations:  Problem # 1:  OBSTRUCTIVE SLEEP APNEA (ICD-327.23) The pt has a h/o severe osa, but has not been on treatment in 3 yrs.  He is continuing to be symptomatic, and I have stressed my concerns about its impact on his CV health.  I think he needs to get back on cpap, and he is willing to try if we can get a better fitting mask.  There are a lot of new choices out there.  I have also stressed to him the need to work on weight loss.  Other Orders: New Patient Level IV (93235) DME Referral (DME)  Patient Instructions: 1)  will send an order to advanced to try you on different full face masks...Marland KitchenMarland Kitchenespecially the quattro fx by [facility]. 2)  will re-optimize your pressure with an auto machine for 2 weeks, and will call you once we have the download. 3)  work on weight loss 4)  followup with me in 12mos, but please call if having cpap issues so we can troubleshoot.

## 2010-06-21 NOTE — Assessment & Plan Note (Signed)
Summary: JULY 2011 FU--LB   Vital Signs:  Patient profile:   60 year old male Height:      74 inches Weight:      251.75 pounds BMI:     32.44 O2 Sat:      93 % on Room air Temp:     98.4 degrees F oral Pulse rate:   74 / minute BP sitting:   116 / 72  (left arm) Cuff size:   large  Vitals Entered By: Zella Ball Ewing CMA (AAMA) (November 18, 2009 8:16 AM)  O2 Flow:  Room air  Preventive Care Screening  Colonoscopy:    Next Due:  02/2011  CC: yearly followup/RE   CC:  yearly followup/RE.  History of Present Illness: overall doing ok;  Pt denies CP, sob, doe, wheezing, orthopnea, pnd, worsening LE edema, palps, dizziness or syncope  Pt denies new neuro symptoms such as headache, facial or extremity weakness     Preventive Screening-Counseling & Management      Drug Use:  no.    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) 16)  Preventive Health Care  (ICD-V70.0) 17)  Sweating  (ICD-780.8) 18)  Flank Pain, Left  (ICD-789.09) 19)  Insomnia  (ICD-780.52) 20)  Thrush  (ICD-112.0) 21)  Acute Prostatitis  (ICD-601.0) 22)  Diverticulosis, Colon  (ICD-562.10) 23)  Obstructive Sleep Apnea  (ICD-327.23) 24)  Glucose Intolerance  (ICD-271.3) 25)  Sinusitis- Acute-nos  (ICD-461.9) 26)  Family History Diabetes 1st Degree Relative  (ICD-V18.0) 27)  Family History of Cad Male 1st Degree Relative <50  (ICD-V17.3) 28)  Low Back Pain  (ICD-724.2) 29)  Gerd   (ICD-530.81) 30)  Hyperlipidemia  (ICD-272.4) 31)  Add  (ICD-314.00) 32)  Pancreatitis, Hx of  (ICD-V12.70) 33)  Tonsillectomy and Adenoidectomy, Hx of  (ICD-V45.79) 34)  Depression  (ICD-311)  Medications Prior to Update: 1)  Adderall 20 Mg  Tabs (Amphetamine-Dextroamphetamine) .Marland Kitchen.. 1 By Mouth Two Times A Day - To Fill December 01, 2009 2)  Sertraline Hcl 100 Mg Tabs (Sertraline Hcl) .Marland Kitchen.. 1po Once Daily 3)  Zolpidem Tartrate 10 Mg  Tabs (Zolpidem Tartrate) .Marland Kitchen.. 1 By Mouth At Bedtime As Needed 4)  Lovastatin 20 Mg  Tabs (Lovastatin) .Marland Kitchen.. 1po Once Daily 5)  Adult Aspirin Ec Low Strength 81 Mg  Tbec (Aspirin) .Marland Kitchen.. 1 By Mouth Qd 6)  Advil 200 Mg  Tabs (Ibuprofen) .... 2 To 3 By Mouth Once Daily As Needed Backi Pain 7)  Gnc Live Well Over 50 Vitamin Pack .Marland Kitchen.. 1 By Mouth Once Daily 8)  Androgel 50 Mg/5gm Gel (Testosterone) .... Use Asd 1 Once Daily 9)  Flomax 0.4 Mg Caps (Tamsulosin Hcl) .Marland Kitchen.. 1 By Mouth Once Daily (Generic) 10)  Azithromycin 250 Mg Tabs (Azithromycin) .... 2po Qd For 1 Day, Then 1po Qd For 4days, Then Stop 11)  Meclizine Hcl 12.5 Mg Tabs (Meclizine Hcl) .Marland Kitchen.. 1 -2 By Mouth Q 6 Hrs As Needed Dizziness 12)  Triamcinolone Acetonide 0.5 % Crea (  Triamcinolone Acetonide) .... Use Asd Two Times A Day As Needed  Current Medications (verified): 1)  Adderall 20 Mg  Tabs (Amphetamine-Dextroamphetamine) .Marland Kitchen.. 1 By Mouth Two Times A Day - To Fill December 01, 2009 2)  Sertraline Hcl 100 Mg Tabs (Sertraline Hcl) .Marland Kitchen.. 1po Once Daily 3)  Zolpidem Tartrate 10 Mg  Tabs (Zolpidem Tartrate) .Marland Kitchen.. 1 By Mouth At Bedtime As Needed 4)  Lovastatin 20 Mg  Tabs (Lovastatin) .Marland Kitchen.. 1po Once Daily 5)  Adult Aspirin Ec Low Strength 81 Mg  Tbec (Aspirin) .Marland Kitchen.. 1 By Mouth Qd 6)  Advil 200 Mg  Tabs (Ibuprofen) .... 2 To 3 By Mouth Once Daily As Needed Backi Pain 7)  Androgel 50 Mg/5gm Gel (Testosterone) .... Use Asd 1 Once Daily 8)  Flomax 0.4 Mg Caps (Tamsulosin Hcl) .Marland Kitchen.. 1 By Mouth Once Daily (Generic) 9)  Mometasone  Furoate 0.1 % Crea (Mometasone Furoate) .... Use Asd Two Times A Day As Needed  Allergies (verified): 1)  Pravachol (Pravastatin Sodium)  Past History:  Past Medical History: Last updated: 12/24/2008 Depression Recurrent Pancreatitis ADD GERD Low back pain Hyperlipidemia glucose intolerance OSA Diverticulosis, colon Sleep Apnea  Past Surgical History: Last updated: 12/24/2008 Cholecystectomy - 2005 Vasectomy 1987 hernia repair 1957 Tonsillectomy  Family History: Last updated: 04/04/2007 Family History of CAD Male 1st degree relative Mother with Hodgkins - died at 36yo Family History Diabetes 1st degree relative  Social History: Last updated: 11/18/2009 Never Smoked 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         all otherwise negative per pt -    Physical Exam  General:  alert and overweight-appearing.   Head:  normocephalic and atraumatic.   Eyes:  vision grossly intact, pupils equal, and pupils round.   Ears:  R ear normal and L ear normal.   Nose:  no external deformity and no nasal discharge.   Mouth:  no gingival abnormalities and  pharynx pink and moist.   Neck:  supple and no masses.   Lungs:  normal respiratory effort and normal breath sounds.   Heart:  normal rate and regular rhythm.   Abdomen:  soft, non-tender, and normal bowel sounds.   Msk:  no joint tenderness and no joint swelling.   Extremities:  no edema, no erythema  Neurologic:  cranial nerves II-XII intact and strength normal in all extremities.   Skin:  color normal and no rashes.   Psych:  not depressed appearing and slightly anxious.     Impression & Recommendations:  Problem # 1:  Preventive Health Care (ICD-V70.0)  Overall doing well, age appropriate education and counseling updated and referral for appropriate preventive services done unless declined, immunizations up to date or declined, diet counseling done if overweight, urged to quit smoking if smokes , most recent labs reviewed and current ordered if appropriate, ecg reviewed or declined (interpretation per  ECG scanned in the EMR if done); information regarding Medicare Prevention requirements given if appropriate; speciality referrals updated as appropriate   Orders: EKG w/ Interpretation (93000)  Problem # 2:  PSA, INCREASED (ICD-790.93)  refer to dr davis/urology with incr velocity PSA  Orders: Urology Referral (Urology)  Complete Medication List: 1)  Adderall 20 Mg Tabs (Amphetamine-dextroamphetamine) .Marland Kitchen.. 1 by mouth two times a day - to fill December 01, 2009 2)  Sertraline Hcl 100 Mg Tabs (Sertraline hcl) .Marland Kitchen.. 1po once daily 3)  Zolpidem Tartrate 10 Mg Tabs (Zolpidem tartrate) .Marland Kitchen.. 1 by mouth at bedtime as needed 4)  Lovastatin 20 Mg Tabs (Lovastatin) .Marland Kitchen.. 1po once daily 5)  Adult Aspirin Ec Low Strength 81 Mg Tbec (Aspirin) .Marland Kitchen.. 1 by mouth qd 6)  Advil 200 Mg Tabs (Ibuprofen) .... 2 to 3 by mouth once daily as needed backi pain 7)  Androgel 50 Mg/5gm Gel (Testosterone) .... Use asd 1 once daily 8)  Flomax 0.4 Mg Caps (Tamsulosin hcl) .Marland Kitchen.. 1 by mouth once daily (generic) 9)   Mometasone Furoate 0.1 % Crea (Mometasone furoate) .... Use asd two times a day as needed  Patient Instructions: 1)  You will be contacted about the referral(s) to: colonoscopy, and Dr Earlene Plater for urology 2)  Continue all previous medications as before this visit  3)  Please schedule a follow-up appointment in 1 year. or sooner if needed Prescriptions: MOMETASONE FUROATE 0.1 % CREA (MOMETASONE FUROATE) use asd two times a day as needed  #45gm x 1   Entered and Authorized by:   Corwin Levins MD   Signed by:   Corwin Levins MD on 11/18/2009   Method used:   [facility]        [phone][facility] (retail[facility]       [address]       [place], [place]  [address]       Ph: [phone] or [phone]       Fax: [phone]   RxID:   [id number]

## 2010-06-21 NOTE — Progress Notes (Signed)
  Phone Note Refill Request  on June 30, 2009 9:18 AM  Refills Requested: Medication #1:  ANDROGEL 50 MG/5GM GEL use asd 1 once daily   Dosage confirmed as above?Dosage Confirmed   [phone] Initial call taken by: Scharlene Gloss,  June 30, 2009 9:18 AM  Follow-up for Phone Call        done hardcopy to LIM side B - dahlia  Follow-up by: Corwin Levins MD,  June 30, 2009 1:10 PM  Additional Follow-up for Phone Call Additional follow up Details #1::        rx faxed to pharmacy Additional Follow-up by: Margaret Pyle, CMA,  June 30, 2009 1:30 PM    Prescriptions: ANDROGEL 50 MG/5GM GEL (TESTOSTERONE) use asd 1 once daily  #90 x 1   Entered and Authorized by:   Corwin Levins MD   Signed by:   Corwin Levins MD on 06/30/2009   Method used:   Print then Give to Patient   RxID:   [id number]

## 2010-06-21 NOTE — Progress Notes (Signed)
Summary: ov already scheduled with kc 9/28 1:30  Phone Note Outgoing Call   Call placed by: Carver Fila,  February 06, 2010 4:44 PM Call placed to: Patient Summary of Call: [email or web address] x1 Called pt to schedule ov with Dr.Clance to discuss compliance on cpap Carver Fila  February 06, 2010 4:45 PM   Follow-up for Phone Call        lmomtcb x2 Carver Fila  February 07, 2010 3:42 PM  called and spoke with pt. pt is coming in on 9/28 at 1:30pm to discuss comliance issues Carver Fila  February 08, 2010 2:47 PM

## 2010-06-21 NOTE — Progress Notes (Signed)
Summary: Rx refill req  Phone Note Call from Patient Call back at Home Phone [phone] Call back at Work Phone [phone]   Caller: Patient Summary of Call: Pt came into clinic requesting Rx for all medication to [facility] mail order pharmacy. Pt is requesting Rx for Androgel and Ambien to [facility] as well. Okay to fill? Initial call taken by: Margaret Pyle, CMA,  November 04, 2009 3:13 PM  Follow-up for Phone Call        ok to fill - I wil sign Follow-up by: Corwin Levins MD,  November 04, 2009 5:34 PM    Prescriptions: ZOLPIDEM TARTRATE 10 MG  TABS (ZOLPIDEM TARTRATE) 1 by mouth at bedtime as needed  #90 x 2   Entered by:   Scharlene Gloss   Authorized by:   Corwin Levins MD   Signed by:   Scharlene Gloss on 11/07/2009   Method used:   Printed then faxed to ...       [facility] (mail-order)             , [place]         Ph: [phone]       Fax: [phone]   RxID:   [id number] ANDROGEL 50 MG/5GM GEL (TESTOSTERONE) use asd 1 once daily  #90 x 2   Entered by:   Scharlene Gloss   Authorized by:   Corwin Levins MD   Signed by:   Scharlene Gloss on 11/07/2009   Method used:   Printed then faxed to ...       [facility] (mail-order)             , [place]         Ph: [phone]       Fax: [phone]   RxID:   [id number] SERTRALINE HCL 100 MG TABS (SERTRALINE HCL) 1po once daily  #90 x 2   Entered by:   Margaret Pyle, CMA   Authorized by:   Corwin Levins MD   Signed by:   Margaret Pyle, CMA on 11/04/2009   Method used:   Faxed to ...       [facility] (mail-order)             , [place]         Ph: [phone]       Fax: [phone]   RxID:   [id number] FLOMAX 0.4 MG CAPS (TAMSULOSIN HCL) 1 by mouth once daily (generic)  #90 x 2   Entered by:   Margaret Pyle, CMA   Authorized by:   Corwin Levins MD   Signed by:   Margaret Pyle, CMA on 11/04/2009   Method used:   Faxed to ...       [facility] (mail-order)             , [place]         Ph: [phone]       Fax:  [phone]   RxID:   [id number] LOVASTATIN 20 MG  TABS (LOVASTATIN) 1po once daily  #90 Tablet x 2   Entered by:   Margaret Pyle, CMA   Authorized by:   Corwin Levins MD   Signed by:   Margaret Pyle, CMA on 11/04/2009   Method used:   Faxed to ...       [facility] (mail-order)             , [place]         Ph: [phone]  Fax: [phone]   RxID:   [id number]

## 2010-06-21 NOTE — Letter (Signed)
Summary: CMN for CPAP Supplies/[facility]  CMN for CPAP Supplies/[facility]   Imported By: Sherian Rein 04/21/2010 08:20:03  _____________________________________________________________________  External Attachment:    Type:   Image     Comment:   External Document

## 2010-06-21 NOTE — Progress Notes (Signed)
Summary: med refill  Phone Note Refill Request  on June 20, 2009 12:08 PM  Refills Requested: Medication #1:  ZOLPIDEM TARTRATE 10 MG  TABS 1 by mouth at bedtime as needed   Dosage confirmed as above?Dosage Confirmed   [facility] [address] [place] [phone] Initial call taken by: Scharlene Gloss,  June 20, 2009 12:08 PM  Follow-up for Phone Call        rx faxed to pharmacy Follow-up by: Margaret Pyle, CMA,  June 20, 2009 1:10 PM    New/Updated Medications: ZOLPIDEM TARTRATE 10 MG  TABS (ZOLPIDEM TARTRATE) 1 by mouth at bedtime as needed Prescriptions: ZOLPIDEM TARTRATE 10 MG  TABS (ZOLPIDEM TARTRATE) 1 by mouth at bedtime as needed  #30 x 5   Entered and Authorized by:   Corwin Levins MD   Signed by:   Corwin Levins MD on 06/20/2009   Method used:   Print then Give to Patient   RxID:   [id number]  done hardcopy to LIM side B - dahlia  Corwin Levins MD  June 20, 2009 12:58 PM

## 2010-06-21 NOTE — Assessment & Plan Note (Signed)
Summary: BP HAS BEEN HIGH/NEED THIS DAY/NWS  #   Vital Signs:  Patient profile:   60 year old male Height:      74 inches Weight:      255.25 pounds BMI:     32.89 O2 Sat:      97 % on Room air Temp:     98.5 degrees F oral Pulse rate:   81 / minute BP sitting:   120 / 80  (left arm) Cuff size:   large  Vitals Entered ByZella Ball Ewing (August 26, 2009 8:07 AM)  O2 Flow:  Room air CC: dizzy, appetite decreased/RE   CC:  dizzy and appetite decreased/RE.  History of Present Illness: here with concerns about BP, although relatively freq BP at home has been 130's over 80's;  c/p fatigue and lack of eneryg despite using the androgel;  has occasional dizziness mostly feeling of off balalnce "like  an electrical current running through" relatively briefly less than 10 sec, occurs daily; no lightheaded or syncope;  no vertigo;  wears glasses for reading and computer, take about 30 min to readjust to far vision which is longer than in the past (especially);  wt stable though states eating less; but not very active. did have sleep study at one point and tx with CPAP but could adjust to the mask even a year or trying (wt then about the same as now);  last saw sleep specialist approx 5 yrs; appettie now doing as well lately as well;  also has some flushing and "burning up"  on occasoin;  also wtih mild rash to bilat arms with itch after working outside.  Needs med refills for ADD  - overall stable without side effect, and seems to be no apparent side effect and is able to function well at work and socially.  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) 15)  Preventive Health Care  (ICD-V70.0) 16)  Sweating  (ICD-780.8) 17)  Flank Pain, Left  (ICD-789.09) 18)  Insomnia  (ICD-780.52) 19)  Thrush  (ICD-112.0) 20)  Acute Prostatitis  (ICD-601.0) 21)  Diverticulosis, Colon  (ICD-562.10) 22)  Obstructive Sleep Apnea  (ICD-327.23) 23)  Glucose Intolerance  (ICD-271.3) 24)  Sinusitis- Acute-nos  (ICD-461.9) 25)  Family History Diabetes 1st Degree Relative  (ICD-V18.0) 26)  Family History of Cad Male 1st Degree Relative <50  (ICD-V17.3) 27)  Low Back Pain  (ICD-724.2) 28)  Gerd  (ICD-530.81) 29)  Hyperlipidemia  (ICD-272.4) 30)  Add  (ICD-314.00) 31)  Pancreatitis, Hx of  (ICD-V12.70) 32)  Tonsillectomy and Adenoidectomy, Hx of  (ICD-V45.79) 33)  Depression  (ICD-311)  Medications Prior to Update: 1)  Adderall 20 Mg  Tabs (Amphetamine-Dextroamphetamine) .Marland Kitchen.. 1 By Mouth Two Times A Day - To Fill Sep 02, 2009 2)  Sertraline Hcl 100 Mg Tabs (Sertraline Hcl) .Marland Kitchen.. 1po Once Daily 3)  Zolpidem Tartrate 10 Mg  Tabs (Zolpidem Tartrate) .Marland Kitchen.. 1 By Mouth At Bedtime As Needed 4)  Lovastatin 20 Mg  Tabs (Lovastatin) .Marland Kitchen.. 1po Once  Daily 5)  Adult Aspirin Ec Low Strength 81 Mg  Tbec (Aspirin) .Marland Kitchen.. 1 By Mouth Qd 6)  Advil 200 Mg  Tabs (Ibuprofen) .... 2 To 3 By Mouth Once Daily As Needed Backi Pain 7)  Gnc Live Well Over 50 Vitamin Pack .Marland Kitchen.. 1 By Mouth Once Daily 8)  Androgel 50 Mg/5gm Gel (Testosterone) .... Use Asd 1 Once Daily 9)  Flomax 0.4 Mg Caps (Tamsulosin Hcl) .Marland Kitchen.. 1 By Mouth Once Daily (Generic) 10)  Azithromycin 250 Mg Tabs (Azithromycin) .... 2po Qd For 1 Day, Then 1po Qd For 4days, Then Stop 11)  Meclizine Hcl 12.5 Mg Tabs (Meclizine Hcl) .Marland Kitchen.. 1 -2 By Mouth Q 6 Hrs As Needed Dizziness  Current Medications (verified): 1)  Adderall 20 Mg  Tabs (Amphetamine-Dextroamphetamine) .Marland Kitchen.. 1 By Mouth Two Times A Day  - To Fill December 01, 2009 2)  Sertraline Hcl 100 Mg Tabs (Sertraline Hcl) .Marland Kitchen.. 1po Once Daily 3)  Zolpidem Tartrate 10 Mg  Tabs (Zolpidem Tartrate) .Marland Kitchen.. 1 By Mouth At Bedtime As Needed 4)  Lovastatin 20 Mg  Tabs (Lovastatin) .Marland Kitchen.. 1po Once Daily 5)  Adult Aspirin Ec Low Strength 81 Mg  Tbec (Aspirin) .Marland Kitchen.. 1 By Mouth Qd 6)  Advil 200 Mg  Tabs (Ibuprofen) .... 2 To 3 By Mouth Once Daily As Needed Backi Pain 7)  Gnc Live Well Over 50 Vitamin Pack .Marland Kitchen.. 1 By Mouth Once Daily 8)  Androgel 50 Mg/5gm Gel (Testosterone) .... Use Asd 1 Once Daily 9)  Flomax 0.4 Mg Caps (Tamsulosin Hcl) .Marland Kitchen.. 1 By Mouth Once Daily (Generic) 10)  Azithromycin 250 Mg Tabs (Azithromycin) .... 2po Qd For 1 Day, Then 1po Qd For 4days, Then Stop 11)  Meclizine Hcl 12.5 Mg Tabs (Meclizine Hcl) .Marland Kitchen.. 1 -2 By Mouth Q 6 Hrs As Needed Dizziness 12)  Triamcinolone Acetonide 0.5 % Crea (Triamcinolone Acetonide) .... 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   Head:  normocephalic and atraumatic.   Eyes:  vision grossly intact, pupils equal, and pupils round.   Ears:  R ear normal and L ear normal.   Nose:  no external deformity and no nasal discharge.     Mouth:  no gingival abnormalities and pharynx pink and moist.   Neck:  supple and no masses.   Lungs:  normal respiratory effort and normal breath sounds.   Heart:  normal rate and regular rhythm.   Extremities:  no edema, no erythema    Impression & Recommendations:  Problem # 1:  OBSTRUCTIVE SLEEP APNEA (ICD-327.23)  mostl likelky a t lesat a component of his fatigue - to refer to pulm  Orders: Pulmonary Referral (Pulmonary)  Problem # 2:  FLUSHING (ICD-782.62) for urine eval today as per orders  Problem # 3:  RASH-NONVESICULAR (ICD-782.1)  His updated medication list for  this problem includes:    Triamcinolone Acetonide 0.5 % Crea (Triamcinolone acetonide) ..... Use asd two times a day as needed treat as above, f/u any worsening signs or symptoms   Problem # 4:  ADD (ICD-314.00) stable overall by hx and exam, ok to continue meds/tx as is, for refills today  Complete Medication List: 1)  Adderall 20 Mg Tabs (Amphetamine-dextroamphetamine) .Marland Kitchen.. 1 by mouth two times a day - to fill December 01, 2009 2)  Sertraline Hcl 100 Mg Tabs (Sertraline hcl) .Marland Kitchen.. 1po once daily 3)  Zolpidem Tartrate 10 Mg Tabs (Zolpidem tartrate) .Marland Kitchen.. 1 by mouth at bedtime as needed 4)  Lovastatin 20 Mg Tabs (Lovastatin) .Marland Kitchen.. 1po once daily 5)  Adult Aspirin Ec Low Strength 81 Mg Tbec (Aspirin) .Marland Kitchen.. 1 by mouth qd 6)  Advil 200 Mg Tabs (Ibuprofen) .... 2 to 3 by mouth once daily as needed backi pain 7)  Gnc Live Well Over 50 Vitamin Pack  .Marland KitchenMarland Kitchen. 1 by mouth once daily 8)  Androgel 50 Mg/5gm Gel (Testosterone) .... Use asd 1 once daily 9)  Flomax 0.4 Mg Caps (Tamsulosin hcl) .Marland Kitchen.. 1 by mouth once daily (generic) 10)  Azithromycin 250 Mg Tabs (Azithromycin) .... 2po qd for 1 day, then 1po qd for 4days, then stop 11)  Meclizine Hcl 12.5 Mg Tabs (Meclizine hcl) .Marland Kitchen.. 1 -2 by mouth q 6 hrs as needed dizziness 12)  Triamcinolone Acetonide 0.5 % Crea (Triamcinolone acetonide) .... Use asd two times a day as  needed  Other Orders: T-Urine 24 Hr. Catecholamines [phone]) T-Urine 24 Hr. Metanephrines [phone]) T-Urine 24 Hr. 5 [id number] [id number]  Patient Instructions: 1)  You will be contacted about the referral(s) to: Pulmonary to followup the CPAP and sleep apnea 2)  Please take all new medications as prescribed  3)  Continue all previous medications as before this visit  4)  Please go to the Lab in the basement for your  urine tests today  5)  please keep your appt in July 1  with CPX labs and : 6)  total testosterone:  607.84 Prescriptions: ADDERALL 20 MG  TABS (AMPHETAMINE-DEXTROAMPHETAMINE) 1 by mouth two times a day - to fill December 01, 2009  #60 x 0   Entered and Authorized by:   Corwin Levins MD   Signed by:   Corwin Levins MD on 08/26/2009   Method used:   Print then Give to Patient   RxID:   [id number] ADDERALL 20 MG  TABS (AMPHETAMINE-DEXTROAMPHETAMINE) 1 by mouth two times a day - to fill November 01, 2009  #60 x 0   Entered and Authorized by:   Corwin Levins MD   Signed by:   Corwin Levins MD on 08/26/2009   Method used:   Print then Give to Patient   RxID:   [id number] ADDERALL 20 MG  TABS (AMPHETAMINE-DEXTROAMPHETAMINE) 1 by mouth two times a day - to fill Oct 02, 2009  #60 x 0   Entered and Authorized by:   Corwin Levins MD   Signed by:   Corwin Levins MD on 08/26/2009   Method used:   Print then Give to Patient   RxID:   [id number] TRIAMCINOLONE ACETONIDE 0.5 % CREA (TRIAMCINOLONE ACETONIDE) use asd two times a day as needed  #1 x 1   Entered and Authorized by:   Corwin Levins MD   Signed by:   Corwin Levins MD on 08/26/2009   Method used:   [facility] [address] (retail)       [address]       [place], [place]  [address]       Ph: [phone] or [phone]       Fax: [phone]   RxID:   [id number]

## 2010-06-21 NOTE — Consult Note (Signed)
Summary: [facility]   Imported By: Sherian Rein 01/05/2010 09:38:32  _____________________________________________________________________  External Attachment:    Type:   Image     Comment:   External Document

## 2010-06-21 NOTE — Consult Note (Signed)
[facility]: [facility]   Imported By: Sherian Rein 01/19/2010 14:28:57  _____________________________________________________________________  External Attachment:    Type:   Image     Comment:   External Document

## 2010-06-21 NOTE — Progress Notes (Signed)
Summary: waiting on cpap auto order  Phone Note Call from Patient   Caller: Patient Call For: [facility] Summary of Call: pt states that [facility] has not yet received an order re: cpap. pt # [id number] Initial call taken by: Tivis Ringer, CNA,  January 10, 2010 9:54 AM  Follow-up for Phone Call        DME company is telling the patient that an order has not been recieved from our office. Can we check on this for the patient?Michel Bickers [facility]  January 10, 2010 12:25 PM  Called Mayra Reel with [facility]. She still has a copy of the order where she faxed it in to [facility]. Mayra Reel will call and have someone call pt today to get this set up.  I called pt back on cell and was sent directly to voice mail. LMOAM that [facility] should call him today to schedule this. If he does not hear from [facility] today, to please call me back tomorrow, b/c I have asked for someone with [facility] to contact him today. Left pt my phone number on his voice mail. Alfonso Ramus  January 10, 2010 1:55 PM      Appended Document: waiting on cpap auto order CPAP Appt scheduled for Monday 01/16/10. Pt contacted by [facility] today and appt scheduled.

## 2010-06-21 NOTE — Progress Notes (Signed)
  Phone Note Refill Request Message from:  Patient on July 14, 2009 2:17 PM  Refills Requested: Medication #1:  SERTRALINE HCL 100 MG TABS 1po once daily   Dosage confirmed as above?Dosage Confirmed  Medication #2:  ZOLPIDEM TARTRATE 10 MG  TABS 1 by mouth at bedtime as needed   Dosage confirmed as above?Dosage Confirmed  Medication #3:  LOVASTATIN 20 MG  TABS 1po once daily   Dosage confirmed as above?Dosage Confirmed  Medication #4:  FLOMAX 0.4 MG CAPS 1 by mouth once daily (generic)   Dosage confirmed as above?Dosage Confirmed Pt would like written RX's so he can send into [facility]? I informed pt that we could send them and he stated this was the first time he has done this and he would like to mail them in. Please advise?  Initial call taken by: Josph Macho RMA,  July 14, 2009 2:18 PM  Follow-up for Phone Call        i did zolpidem - other routines to robin Follow-up by: Corwin Levins MD,  July 14, 2009 2:59 PM  Additional Follow-up for Phone Call Additional follow up Details #1::        faxed Zolpidem Hardcopy to [facility] Additional Follow-up by: Scharlene Gloss,  July 14, 2009 4:35 PM    Prescriptions: FLOMAX 0.4 MG CAPS (TAMSULOSIN HCL) 1 by mouth once daily (generic)  #90 x 1   Entered by:   Scharlene Gloss   Authorized by:   Corwin Levins MD   Signed by:   Scharlene Gloss on 07/14/2009   Method used:   Faxed to ...       [facility]* (mail-order)             ,          Ph: [phone]       Fax: [phone]   RxID:   [id number] LOVASTATIN 20 MG  TABS (LOVASTATIN) 1po once daily  #90 Tablet x 1   Entered by:   Scharlene Gloss   Authorized by:   Corwin Levins MD   Signed by:   Scharlene Gloss on 07/14/2009   Method used:   Faxed to ...       [facility]* (mail-order)             ,          Ph: [phone]       Fax: [phone]   RxID:   [id number] SERTRALINE HCL 100 MG TABS (SERTRALINE HCL) 1po once daily  #90 x 1   Entered by:   Scharlene Gloss  Authorized by:   Corwin Levins MD   Signed by:   Scharlene Gloss on 07/14/2009   Method used:   Faxed to ...       [facility]* (mail-order)             ,          Ph: [phone]       Fax: [phone]   RxID:   [id number] ZOLPIDEM TARTRATE 10 MG  TABS (ZOLPIDEM TARTRATE) 1 by mouth at bedtime as needed  #90 x 1   Entered and Authorized by:   Corwin Levins MD   Signed by:   Corwin Levins MD on 07/14/2009   Method used:   Print then Give to Patient   RxID:   [id number]

## 2010-06-22 NOTE — Progress Notes (Signed)
Summary: Adderall  Phone Note Call from Patient Call back at Work Phone [phone]   Caller: Patient Summary of Call: Pt called requesting refill of Adderall 20mg . Per pt pharmacy now has this dose in stock. Initial call taken by: Margaret Pyle, CMA,  May 30, 2010 1:05 PM    New/Updated Medications: ADDERALL 20 MG TABS (AMPHETAMINE-DEXTROAMPHETAMINE) 1 by mouth two times a day  - to fill Jun 02, 2010 Prescriptions: ADDERALL 20 MG TABS (AMPHETAMINE-DEXTROAMPHETAMINE) 1 by mouth two times a day  - to fill Jun 02, 2010  #60 x 0   Entered and Authorized by:   Corwin Levins MD   Signed by:   Corwin Levins MD on 05/30/2010   Method used:   Print then Give to Patient   RxID:   [id number]  done hardcopy to LIM side B - dahlia Corwin Levins MD  May 30, 2010 1:20 PM   Pt informed, rx in cabinet for pt pick up Margaret Pyle, CMA  May 30, 2010 1:34 PM

## 2010-07-10 ENCOUNTER — Telehealth: Payer: Self-pay | Admitting: Internal Medicine

## 2010-07-18 NOTE — Progress Notes (Signed)
Summary: Adderall  Phone Note Call from Patient Call back at Work Phone [phone]   Caller: Patient Summary of Call: Pt called requesting 3 mth refill of Adderall Initial call taken by: Margaret Pyle, CMA,  July 10, 2010 3:46 PM  Follow-up for Phone Call        Pt informed, rx in cabinet for pt pick up Follow-up by: Margaret Pyle, CMA,  July 10, 2010 4:56 PM    New/Updated Medications: ADDERALL 20 MG TABS (AMPHETAMINE-DEXTROAMPHETAMINE) 1 by mouth two times a day  - to fill feb 20., 2012 ADDERALL 20 MG TABS (AMPHETAMINE-DEXTROAMPHETAMINE) 1 by mouth two times a day  - to fill mar 22., 2012 ADDERALL 20 MG TABS (AMPHETAMINE-DEXTROAMPHETAMINE) 1 by mouth two times a day  - to fill Sep 09, 2010 Prescriptions: ADDERALL 20 MG TABS (AMPHETAMINE-DEXTROAMPHETAMINE) 1 by mouth two times a day  - to fill Sep 09, 2010  #60 x 0   Entered and Authorized by:   Corwin Levins MD   Signed by:   Corwin Levins MD on 07/10/2010   Method used:   Print then Give to Patient   RxID:   [id number] ADDERALL 20 MG TABS (AMPHETAMINE-DEXTROAMPHETAMINE) 1 by mouth two times a day  - to fill mar 22., 2012  #60 x 0   Entered and Authorized by:   Corwin Levins MD   Signed by:   Corwin Levins MD on 07/10/2010   Method used:   Print then Give to Patient   RxID:   [id number] ADDERALL 20 MG TABS (AMPHETAMINE-DEXTROAMPHETAMINE) 1 by mouth two times a day  - to fill feb 20., 2012  #60 x 0   Entered and Authorized by:   Corwin Levins MD   Signed by:   Corwin Levins MD on 07/10/2010   Method used:   Print then Give to Patient   RxID:   [id number]

## 2010-07-24 ENCOUNTER — Encounter: Payer: Self-pay | Admitting: Internal Medicine

## 2010-07-25 ENCOUNTER — Encounter: Payer: Self-pay | Admitting: Endocrinology

## 2010-07-25 ENCOUNTER — Ambulatory Visit (INDEPENDENT_AMBULATORY_CARE_PROVIDER_SITE_OTHER): Payer: BC Managed Care – PPO | Admitting: Endocrinology

## 2010-07-25 DIAGNOSIS — J069 Acute upper respiratory infection, unspecified: Secondary | ICD-10-CM | POA: Insufficient documentation

## 2010-07-25 HISTORY — DX: Acute upper respiratory infection, unspecified: J06.9

## 2010-07-27 ENCOUNTER — Telehealth: Payer: Self-pay | Admitting: Internal Medicine

## 2010-08-01 NOTE — Letter (Signed)
[facility]: [facility]   Imported By: Sherian Rein 07/28/2010 12:31:02  _____________________________________________________________________  External Attachment:    Type:   Image     Comment:   External Document

## 2010-08-01 NOTE — Progress Notes (Signed)
Summary: Rx request  Phone Note Call from Patient Call back at Home Phone [phone] P PH     Caller: Patient Summary of Call: Pt was in for OV w/Dr Everardo All on Mon 07/25/10 for cold w/cough & post nasal drip; prescribed Cefuroxine Axetil & instructed to use Loratadine. Pt states that meds are not working for cough and request a Rx for [employer]. Initial call taken by: Burnard Leigh [facility]),  July 27, 2010 8:24 AM  Follow-up for Phone Call        rx done per emr Follow-up by: Corwin Levins MD,  July 27, 2010 8:35 AM  Additional Follow-up for Phone Call Additional follow up Details #1::        Pt advised of Rx via home VM Additional Follow-up by: Margaret Pyle, CMA,  July 27, 2010 8:42 AM    New/Updated Medications: TESSALON PERLES 100 MG CAPS (BENZONATATE) 1-2 by mouth three times a day as needed Prescriptions: TESSALON PERLES 100 MG CAPS (BENZONATATE) 1-2 by mouth three times a day as needed  #60 x 1   Entered and Authorized by:   Corwin Levins MD   Signed by:   Corwin Levins MD on 07/27/2010   Method used:   [phone]       [address]       [place], [place]  [address]       Ph: [phone] or [phone]       Fax: [phone]   RxID:   [id number]

## 2010-08-01 NOTE — Assessment & Plan Note (Signed)
Summary: DR Sammuel Cooper PT/NO SLOT--SORE THROAT  STC   Vital Signs:  Patient profile:   60 year old male Height:      74 inches (187.96 cm) Weight:      253.38 pounds (115.17 kg) BMI:     32.65 O2 Sat:      96 % on Room air Temp:     99.3 degrees F (37.39 degrees C) oral Pulse rate:   85 / minute Pulse rhythm:   regular BP sitting:   114 / 72  (left arm) Cuff size:   large  Vitals Entered By: Brenton Grills CMA Duncan Dull) (July 25, 2010 9:10 AM)  O2 Flow:  Room air CC: Sore throat, drainage x 3 days/aj Is Patient Diabetic? No Comments pt is no longer taking Androgel   Referring Provider:  Self Referral Primary Provider:  Corwin Levins MD  CC:  Sore throat and drainage x 3 days/aj.  History of Present Illness: 3 days of sore throat, "post-nasal drip," and low-grade temp.  Current Medications (verified): 1)  Adderall 20 Mg Tabs (Amphetamine-Dextroamphetamine) .Marland Kitchen.. 1 By Mouth Two Times A Day  - To Fill Sep 09, 2010 2)  Sertraline Hcl 100 Mg Tabs (Sertraline Hcl) .Marland Kitchen.. 1po Once Daily 3)  Zolpidem Tartrate 10 Mg  Tabs (Zolpidem Tartrate) .Marland Kitchen.. 1 By Mouth At Bedtime As Needed 4)  Lovastatin 20 Mg  Tabs (Lovastatin) .Marland Kitchen.. 1po Once Daily 5)  Adult Aspirin Ec Low Strength 81 Mg  Tbec (Aspirin) .Marland Kitchen.. 1 By Mouth Qd 6)  Advil 200 Mg  Tabs (Ibuprofen) .... 2 To 3 By Mouth Once Daily As Needed Backi Pain 7)  Androgel 50 Mg/5gm Gel (Testosterone) .... Use Asd 1 Once Daily 8)  Flomax 0.4 Mg Caps (Tamsulosin Hcl) .Marland Kitchen.. 1 By Mouth Once Daily (Generic) 9)  Mometasone Furoate 0.1 % Crea (Mometasone Furoate) .... Use Asd Two Times A Day As Needed  Allergies (verified): 1)  Pravachol (Pravastatin Sodium)  Past History:  Past Medical History: Last updated: 01/04/2010 Depression Recurrent Pancreatitis ADD GERD Low back pain Hyperlipidemia glucose intolerance OSA--AHI 74/hr in 2006. Diverticulosis, colon  Social History: Reviewed history from 01/04/2010 and no changes required. Never  Smoked Alcohol use-yes work - Estate agent Married and lives with wife. 3 children Drug use-no  Review of Systems       no cough or earache  Physical Exam  General:  Well developed, well nourished, in no acute distress.  Head:  head: no deformity eyes: no periorbital swelling, no proptosis external nose and ears are normal mouth: no lesion seen Neck:  Supple without thyroid enlargement or tenderness.  Lungs:  Clear to auscultation bilaterally. Normal respiratory effort.    Impression & Recommendations:  Problem # 1:  URI (ICD-465.9) Assessment New  Medications Added to Medication List This Visit: 1)  Cefuroxime Axetil 250 Mg Tabs (Cefuroxime axetil) .Marland Kitchen.. 1 tab two times a day  Other Orders: Est. Patient Level III (81191)  Patient Instructions: 1)  take cefuroxine two times a day 2)  take loratadine (non-prescription) as needed for "post-nasal drip." 3)  return here as needed Prescriptions: CEFUROXIME AXETIL 250 MG TABS (CEFUROXIME AXETIL) 1 tab two times a day  #14 x 0   Entered and Authorized by:   Minus Breeding MD   Signed by:   Minus Breeding MD on 07/25/2010   Method used:   [facility]  [address] [phone][facility]ail[facility]  [address]       [place], [place]  [address]       Ph: [phone] or [phone]       Fax: [phone]   RxID:   [id number]    Orders Added: 1)  Est. Patient Level III [73532]

## 2010-09-10 ENCOUNTER — Encounter: Payer: Self-pay | Admitting: Internal Medicine

## 2010-09-10 DIAGNOSIS — Z0000 Encounter for general adult medical examination without abnormal findings: Secondary | ICD-10-CM | POA: Insufficient documentation

## 2010-09-12 ENCOUNTER — Other Ambulatory Visit (INDEPENDENT_AMBULATORY_CARE_PROVIDER_SITE_OTHER): Payer: BC Managed Care – PPO

## 2010-09-12 ENCOUNTER — Other Ambulatory Visit: Payer: Self-pay | Admitting: Internal Medicine

## 2010-09-12 DIAGNOSIS — Z0000 Encounter for general adult medical examination without abnormal findings: Secondary | ICD-10-CM

## 2010-09-12 DIAGNOSIS — Z0389 Encounter for observation for other suspected diseases and conditions ruled out: Secondary | ICD-10-CM

## 2010-09-12 LAB — URINALYSIS
Bilirubin Urine: NEGATIVE
Nitrite: NEGATIVE
Total Protein, Urine: NEGATIVE
Urine Glucose: NEGATIVE
pH: 6 (ref 5.0–8.0)

## 2010-09-12 LAB — CBC WITH DIFFERENTIAL/PLATELET
Basophils Relative: 0.5 % (ref 0.0–3.0)
Eosinophils Relative: 4.6 % (ref 0.0–5.0)
HCT: 41.3 % (ref 39.0–52.0)
Hemoglobin: 14.5 g/dL (ref 13.0–17.0)
Lymphs Abs: 1.4 10*3/uL (ref 0.7–4.0)
MCV: 99.1 fl (ref 78.0–100.0)
Monocytes Absolute: 0.4 10*3/uL (ref 0.1–1.0)
Monocytes Relative: 7.8 % (ref 3.0–12.0)
Neutro Abs: 3.3 10*3/uL (ref 1.4–7.7)
Platelets: 234 10*3/uL (ref 150.0–400.0)
WBC: 5.4 10*3/uL (ref 4.5–10.5)

## 2010-09-12 LAB — BASIC METABOLIC PANEL
Calcium: 9.2 mg/dL (ref 8.4–10.5)
GFR: 89.51 mL/min (ref >60.00)
Glucose, Bld: 141 mg/dL — ABNORMAL HIGH (ref 70–99)
Potassium: 4.8 mEq/L (ref 3.5–5.1)
Sodium: 138 mEq/L (ref 135–145)

## 2010-09-12 LAB — LIPID PANEL
Cholesterol: 151 mg/dL (ref 0–200)
HDL: 42.7 mg/dL (ref >39.00)

## 2010-09-12 LAB — HEPATIC FUNCTION PANEL
ALT: 36 U/L (ref 0–53)
Bilirubin, Direct: 0.1 mg/dL (ref 0.0–0.3)
Total Bilirubin: 1 mg/dL (ref 0.3–1.2)

## 2010-09-13 NOTE — Progress Notes (Signed)
Quick Note:  Voice message left on PhoneTree system - lab is negative, normal or otherwise stable, pt to continue same tx ______ 

## 2010-09-15 ENCOUNTER — Encounter: Payer: Self-pay | Admitting: Internal Medicine

## 2010-10-06 NOTE — Procedures (Signed)
NAME:  Paul Tucker, Paul Tucker                 ACCOUNT NO.:  [id number]   MEDICAL RECORD NO.:  [id number]          PATIENT TYPE:  OUT   LOCATION:  SLEEP CENTER                 FACILITY[facility]  [facility]   PHYSICIAN:  Marcelyn Bruins, M.D. [facility] DATE OF BIRTH:  1950-12-22   DATE OF STUDY:  10/01/2004                              NOCTURNAL POLYSOMNOGRAM   REFERRING PHYSICIAN:  Dr. Marcelyn Bruins   INDICATION FOR THE STUDY:  Hypersomnia with sleep apnea. Epworth score: 18.   SLEEP ARCHITECTURE:  The patient had a total sleep time of 329 minutes with  decreased REM and slow wave sleep. Sleep onset latency was prolonged at 31  minutes as was REM onset at 201 minutes.   IMPRESSION:  1. Severe obstructive sleep apnea/hypopnea syndrome with a respiratory      disturbance index of 74 events per hour and oxygen desaturation as low      as 74%. The events were clearly worse in the supine position.  2. Loud snoring noted throughout the study.  3. Rare premature ventricular contractions without clinical significance.  4. Small numbers of leg jerks with mild sleep disruption. Clinical      correlation is suggested after appropriate treatment of sleep apnea.        KC/MEDQ  D:  10/18/2004 11:56:43  T:  10/18/2004 12:54:50  Job:  [id number]

## 2010-10-18 ENCOUNTER — Other Ambulatory Visit: Payer: Self-pay

## 2010-10-18 MED ORDER — SERTRALINE HCL 100 MG PO TABS: 100.0000 mg | ORAL_TABLET | Freq: Every day | ORAL | Status: DC

## 2010-10-18 MED ORDER — TAMSULOSIN HCL 0.4 MG PO CAPS: 0.4000 mg | ORAL_CAPSULE | Freq: Every day | ORAL | Status: DC

## 2010-10-18 MED ORDER — LOVASTATIN 20 MG PO TABS: 20.0000 mg | ORAL_TABLET | Freq: Every day | ORAL | Status: DC

## 2010-10-18 MED ORDER — ZOLPIDEM TARTRATE 10 MG PO TABS: 10.0000 mg | ORAL_TABLET | Freq: Every evening | ORAL | Status: DC | PRN

## 2010-10-18 MED ORDER — AMPHETAMINE-DEXTROAMPHETAMINE 20 MG PO TABS: 20.0000 mg | ORAL_TABLET | Freq: Two times a day (BID) | ORAL | Status: DC

## 2010-10-19 NOTE — Telephone Encounter (Signed)
Pt informed, Rx in cabinet for pt pick up  

## 2010-11-30 ENCOUNTER — Ambulatory Visit (INDEPENDENT_AMBULATORY_CARE_PROVIDER_SITE_OTHER): Payer: BC Managed Care – PPO | Admitting: Internal Medicine

## 2010-11-30 ENCOUNTER — Encounter: Payer: Self-pay | Admitting: Internal Medicine

## 2010-11-30 VITALS — BP 130/78 | HR 75 | Temp 98.9°F | Ht 74.0 in | Wt 254.2 lb

## 2010-11-30 DIAGNOSIS — R7302 Impaired glucose tolerance (oral): Secondary | ICD-10-CM

## 2010-11-30 DIAGNOSIS — E739 Lactose intolerance, unspecified: Secondary | ICD-10-CM

## 2010-11-30 DIAGNOSIS — G4700 Insomnia, unspecified: Secondary | ICD-10-CM

## 2010-11-30 DIAGNOSIS — Z0000 Encounter for general adult medical examination without abnormal findings: Secondary | ICD-10-CM

## 2010-11-30 DIAGNOSIS — R7309 Other abnormal glucose: Secondary | ICD-10-CM

## 2010-11-30 MED ORDER — SERTRALINE HCL 100 MG PO TABS: 100.0000 mg | ORAL_TABLET | Freq: Every day | ORAL | Status: DC

## 2010-11-30 MED ORDER — LOVASTATIN 20 MG PO TABS: 20.0000 mg | ORAL_TABLET | Freq: Every day | ORAL | Status: DC

## 2010-11-30 MED ORDER — AMPHETAMINE-DEXTROAMPHETAMINE 20 MG PO TABS: 20.0000 mg | ORAL_TABLET | Freq: Two times a day (BID) | ORAL | Status: DC

## 2010-11-30 MED ORDER — TAMSULOSIN HCL 0.4 MG PO CAPS: 0.4000 mg | ORAL_CAPSULE | Freq: Every day | ORAL | Status: DC

## 2010-11-30 MED ORDER — ZOLPIDEM TARTRATE ER 6.25 MG PO TBCR: 6.2500 mg | EXTENDED_RELEASE_TABLET | Freq: Every evening | ORAL | Status: DC | PRN

## 2010-11-30 NOTE — Assessment & Plan Note (Signed)
ambien 10 mg too sedating next day, will try to change to ambien cR lower dose,   to f/u any worsening symptoms or concerns

## 2010-11-30 NOTE — Patient Instructions (Signed)
Continue all other medications as before Please return in 1 year for your yearly visit, or sooner if needed, with Lab testing done 3-5 days before  

## 2010-11-30 NOTE — Progress Notes (Signed)
Subjective:    Patient ID: Paul Tucker, male    DOB: [birth year], 60 y.o.   MRN: [id number]  HPI Here for wellness and f/u;  Overall doing ok;  Pt denies CP, worsening SOB, DOE, wheezing, orthopnea, PND, worsening LE edema, palpitations, dizziness or syncope.  Pt denies neurological change such as new Headache, facial or extremity weakness.  Pt denies polydipsia, polyuria, or low sugar symptoms. Pt states overall good compliance with treatment and medications, good tolerability, and trying to follow lower cholesterol diet.  Pt denies worsening depressive symptoms, suicidal ideation or panic. No fever, wt loss, night sweats, loss of appetite, or other constitutional symptoms.  Pt states good ability with ADL's, low fall risk, home safety reviewed and adequate, no significant changes in hearing or vision, and occasionally active with exercise. Ambien too little to work, and ambien 10 mg causes next day hangover.  Also mentions has had ongoing LBP, worse on the left side,  without change in severity, bowel or bladder change, fever, wt loss,  worsening LE pain/numbness/weakness, gait change or falls except for some numbness to distal foot and toes left foot only.  2-3 advil controls the pain.  Needs 90 day med refills hardcopy - new mail order company Past Medical History  Diagnosis Date  . Dermatophytosis of groin and perianal area 05/31/2008  . THRUSH 04/04/2007  . HYPOGONADISM 06/13/2009  . GLUCOSE INTOLERANCE 04/04/2007  . HYPERLIPIDEMIA 01/20/2007  . DEPRESSION 01/17/2007  . ADD 01/17/2007  . OBSTRUCTIVE SLEEP APNEA 04/04/2007  . OTITIS MEDIA, ACUTE, BILATERAL 06/13/2009  . SINUSITIS- ACUTE-NOS 04/04/2007  . GERD 01/20/2007  . DIVERTICULOSIS, COLON 04/06/2007  . HEMATOCHEZIA 07/26/2008  . Gross hematuria 01/27/2009  . Acute prostatitis 04/04/2007  . 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 LOW BACK PAIN 01/20/2007  . VERTIGO 06/13/2009  . INSOMNIA 04/04/2007  . SWEATING 09/30/2007  . RASH-NONVESICULAR  08/26/2009  . Flushing 08/26/2009  . Dysuria 01/27/2009  . URINARY RETENTION 01/27/2009  . FLANK PAIN, LEFT 09/30/2007  . PSA, INCREASED 11/18/2009  . LIBIDO, DECREASED 12/02/2008  . PANCREATITIS, HX OF 01/17/2007  . TONSILLECTOMY AND ADENOIDECTOMY, HX OF 01/17/2007  . URI 07/25/2010   Past Surgical History  Procedure Date  . Cholecystectomy 2005  . Vasectomy 1987  . Hernia repair 1957  . Tonsillectomy     reports that he has never smoked. He does not have any smokeless tobacco history on file. He reports that he drinks alcohol. He reports that he does not use illicit drugs. family history includes Coronary artery disease in his other; Diabetes in his other; Emphysema in his father; and Heart disease in his father. Allergies  Allergen Reactions  . Pravastatin Sodium     REACTION: nausea   Current Outpatient Prescriptions on File Prior to Visit  Medication Sig Dispense Refill  . amphetamine-dextroamphetamine (ADDERALL) 20 MG tablet Take 1 tablet (20 mg total) by mouth 2 (two) times daily. To fill Oct 19, 2010  60 tablet  0  . aspirin 81 MG EC tablet Take 81 mg by mouth daily.        Marland Kitchen lovastatin (MEVACOR) 20 MG tablet Take 1 tablet (20 mg total) by mouth daily.  30 tablet  1  . sertraline (ZOLOFT) 100 MG tablet Take 1 tablet (100 mg total) by mouth daily.  30 tablet  1  . Tamsulosin HCl (FLOMAX) 0.4 MG CAPS Take 1 capsule (0.4 mg total) by mouth daily.  60 capsule  1  . ibuprofen (ADVIL,MOTRIN) 200 MG  tablet 2-3 by mouth once daily as needed for back pain       . mometasone (ELOCON) 0.1 % cream Apply topically 2 (two) times daily as needed.         Review of Systems Review of Systems  Constitutional: Negative for diaphoresis, activity change, appetite change and unexpected weight change.  HENT: Negative for hearing loss, ear pain, facial swelling, mouth sores and neck stiffness.   Eyes: Negative for pain, redness and visual disturbance.  Respiratory: Negative for shortness of breath and  wheezing.   Cardiovascular: Negative for chest pain and palpitations.  Gastrointestinal: Negative for diarrhea, blood in stool, abdominal distention and rectal pain.  Genitourinary: Negative for hematuria, flank pain and decreased urine volume.  Musculoskeletal: Negative for myalgias and joint swelling.  Skin: Negative for color change and wound.  Neurological: Negative for syncope and numbness.  Hematological: Negative for adenopathy.  Psychiatric/Behavioral: Negative for hallucinations, self-injury, decreased concentration and agitation.      Objective:   Physical Exam BP 130/78  Pulse 75  Temp(Src) 98.9 F (37.2 C) (Oral)  Ht 6\' 2"  (1.88 m)  Wt 254 lb 3.2 oz (115.304 kg)  BMI 32.64 kg/m2  SpO2 94% Physical Exam  VS noted Constitutional: Pt is oriented to person, place, and time. Appears well-developed and well-nourished.  HENT:  Head: Normocephalic and atraumatic.  Right Ear: External ear normal.  Left Ear: External ear normal.  Nose: Nose normal.  Mouth/Throat: Oropharynx is clear and moist.  Eyes: Conjunctivae and EOM are normal. Pupils are equal, round, and reactive to light.  Neck: Normal range of motion. Neck supple. No JVD present. No tracheal deviation present.  Cardiovascular: Normal rate, regular rhythm, normal heart sounds and intact distal pulses.   Pulmonary/Chest: Effort normal and breath sounds normal.  Abdominal: Soft. Bowel sounds are normal. There is no tenderness.  Musculoskeletal: Normal range of motion. Exhibits no edema.  Lymphadenopathy:  Has no cervical adenopathy.  Neurological: Pt is alert and oriented to person, place, and time. Pt has normal reflexes. No cranial nerve deficit. Motor/dtr/gait intact , sens decreased to LT to distal foot Skin: Skin is warm and dry. No rash noted.  Psychiatric:  Has  normal mood and affect. Behavior is normal. 1+ nervous        Assessment & Plan:

## 2010-12-10 ENCOUNTER — Encounter: Payer: Self-pay | Admitting: Internal Medicine

## 2010-12-10 DIAGNOSIS — E1149 Type 2 diabetes mellitus with other diabetic neurological complication: Secondary | ICD-10-CM | POA: Insufficient documentation

## 2010-12-10 DIAGNOSIS — R7302 Impaired glucose tolerance (oral): Secondary | ICD-10-CM

## 2010-12-10 HISTORY — DX: Impaired glucose tolerance (oral): R73.02

## 2010-12-10 NOTE — Assessment & Plan Note (Signed)

## 2010-12-19 ENCOUNTER — Telehealth: Payer: Self-pay

## 2010-12-19 MED ORDER — MECLIZINE HCL 12.5 MG PO TABS: ORAL_TABLET | ORAL | Status: DC

## 2010-12-19 NOTE — Telephone Encounter (Signed)
rx done per emr 

## 2010-12-19 NOTE — Telephone Encounter (Signed)
Pt called requesting refill of Meclizine, removed from med list 11/18/2009. Pt states he is having dizziness related to allergies, please advise.

## 2010-12-20 NOTE — Telephone Encounter (Signed)
Pt informed of Rx/pharmacy 

## 2011-01-03 ENCOUNTER — Ambulatory Visit: Payer: Self-pay | Admitting: Pulmonary Disease

## 2011-04-30 ENCOUNTER — Other Ambulatory Visit: Payer: Self-pay

## 2011-04-30 MED ORDER — AMPHETAMINE-DEXTROAMPHETAMINE 20 MG PO TABS: 20.0000 mg | ORAL_TABLET | Freq: Two times a day (BID) | ORAL | Status: DC

## 2011-04-30 MED ORDER — ZOLPIDEM TARTRATE 10 MG PO TABS: 10.0000 mg | ORAL_TABLET | Freq: Every evening | ORAL | Status: DC | PRN

## 2011-04-30 NOTE — Telephone Encounter (Signed)
Patient informed to pickup prescriptions at front desk.

## 2011-04-30 NOTE — Telephone Encounter (Signed)
Done hardcopy to robin  

## 2011-04-30 NOTE — Telephone Encounter (Signed)
Pt is requesting 3 month refill at a time.

## 2011-05-23 ENCOUNTER — Telehealth: Payer: Self-pay

## 2011-05-23 MED ORDER — ZOLPIDEM TARTRATE ER 6.25 MG PO TBCR: 6.2500 mg | EXTENDED_RELEASE_TABLET | Freq: Every evening | ORAL | Status: DC | PRN

## 2011-05-23 MED ORDER — AMPHETAMINE-DEXTROAMPHETAMINE 20 MG PO TABS: 20.0000 mg | ORAL_TABLET | Freq: Two times a day (BID) | ORAL | Status: DC

## 2011-05-23 NOTE — Telephone Encounter (Signed)
Done hardcopy to robin  

## 2011-05-23 NOTE — Telephone Encounter (Signed)
Patient called to inform prescriptions requested are ready for pickup at front desk.

## 2011-05-23 NOTE — Telephone Encounter (Signed)
Patient requesting refill on Zolpidem ER 6.25 (recent refill was for zolpidem 10mg ) and needs 3 months of Adderall. Please advise call back number is [phone] or [phone]

## 2011-05-24 ENCOUNTER — Telehealth: Payer: Self-pay

## 2011-05-24 MED ORDER — ZOLPIDEM TARTRATE ER 6.25 MG PO TBCR: 6.2500 mg | EXTENDED_RELEASE_TABLET | Freq: Every evening | ORAL | Status: DC | PRN

## 2011-05-24 NOTE — Telephone Encounter (Signed)
Pt's spouse called stating Ambien Rx is for mail order and needs to be for #90 x 1. Pt will bring back previous Rx in return for corrected Rx. Okay to change?

## 2011-05-24 NOTE — Telephone Encounter (Signed)
Done hardcopy to robin  

## 2011-05-25 NOTE — Telephone Encounter (Signed)
Called the patient informed prescription is ready for pickup and is on my desk. Once patient returns with incorrect prescription will give correct prescription. The patient stated he may not be in until next week to get the prescription.

## 2011-07-17 ENCOUNTER — Ambulatory Visit (INDEPENDENT_AMBULATORY_CARE_PROVIDER_SITE_OTHER): Payer: BC Managed Care – PPO | Admitting: Internal Medicine

## 2011-07-17 ENCOUNTER — Encounter: Payer: Self-pay | Admitting: Internal Medicine

## 2011-07-17 ENCOUNTER — Other Ambulatory Visit (INDEPENDENT_AMBULATORY_CARE_PROVIDER_SITE_OTHER): Payer: BC Managed Care – PPO

## 2011-07-17 DIAGNOSIS — F3289 Other specified depressive episodes: Secondary | ICD-10-CM

## 2011-07-17 DIAGNOSIS — R079 Chest pain, unspecified: Secondary | ICD-10-CM | POA: Insufficient documentation

## 2011-07-17 DIAGNOSIS — R109 Unspecified abdominal pain: Secondary | ICD-10-CM

## 2011-07-17 DIAGNOSIS — F329 Major depressive disorder, single episode, unspecified: Secondary | ICD-10-CM

## 2011-07-17 LAB — CBC WITH DIFFERENTIAL/PLATELET
Basophils Absolute: 0.1 10*3/uL (ref 0.0–0.1)
Eosinophils Absolute: 0.2 10*3/uL (ref 0.0–0.7)
HCT: 42.5 % (ref 39.0–52.0)
Lymphs Abs: 1.7 10*3/uL (ref 0.7–4.0)
MCHC: 34.5 g/dL (ref 30.0–36.0)
Monocytes Relative: 6.6 % (ref 3.0–12.0)
Platelets: 236 10*3/uL (ref 150.0–400.0)
RDW: 12.8 % (ref 11.5–14.6)

## 2011-07-17 LAB — URINALYSIS, ROUTINE W REFLEX MICROSCOPIC
Bilirubin Urine: NEGATIVE
Hgb urine dipstick: NEGATIVE
Leukocytes, UA: NEGATIVE
Nitrite: NEGATIVE
Urobilinogen, UA: 0.2 (ref 0.0–1.0)
pH: 6 (ref 5.0–8.0)

## 2011-07-17 MED ORDER — FLUOCINONIDE 0.05 % EX CREA: TOPICAL_CREAM | Freq: Two times a day (BID) | CUTANEOUS | Status: DC

## 2011-07-17 MED ORDER — ZOLPIDEM TARTRATE 10 MG PO TABS: 10.0000 mg | ORAL_TABLET | Freq: Every evening | ORAL | Status: DC | PRN

## 2011-07-17 MED ORDER — AMPHETAMINE-DEXTROAMPHETAMINE 20 MG PO TABS: 20.0000 mg | ORAL_TABLET | Freq: Two times a day (BID) | ORAL | Status: DC

## 2011-07-17 MED ORDER — CICLOPIROX 0.77 % EX GEL: 1.0000 "application " | Freq: Two times a day (BID) | CUTANEOUS | Status: DC

## 2011-07-17 NOTE — Patient Instructions (Signed)
Continue all other medications as before You are given the refills you requested today Please go to XRAY in the Basement for the x-ray test Please go to LAB in the Basement for the blood and/or urine tests to be done today You will be contacted regarding the referral for: ultrasound for the abdomen Please call the phone number [phone] (the PhoneTree System) for results of testing in 2-3 days;  When calling, simply dial the number, and when prompted enter the MRN number above (the Medical Record Number) and the # key, then the message should start. If the evaluation is negative and symptoms persist, you may wish to see Dr Kaplan/GI

## 2011-07-18 ENCOUNTER — Ambulatory Visit: Payer: BC Managed Care – PPO | Admitting: Internal Medicine

## 2011-07-18 LAB — HEPATIC FUNCTION PANEL
AST: 20 U/L (ref 0–37)
Albumin: 4.5 g/dL (ref 3.5–5.2)
Alkaline Phosphatase: 69 U/L (ref 39–117)
Total Protein: 7.6 g/dL (ref 6.0–8.3)

## 2011-07-18 LAB — BASIC METABOLIC PANEL
CO2: 27 mEq/L (ref 19–32)
GFR: 96.47 mL/min (ref >60.00)
Glucose, Bld: 94 mg/dL (ref 70–99)
Potassium: 4.1 mEq/L (ref 3.5–5.1)
Sodium: 136 mEq/L (ref 135–145)

## 2011-07-20 ENCOUNTER — Ambulatory Visit
Admission: RE | Admit: 2011-07-20 | Discharge: 2011-07-20 | Disposition: A | Discharge status: Home / Self Care | Payer: BC Managed Care – PPO | Source: Ambulatory Visit | Attending: Internal Medicine | Admitting: Internal Medicine

## 2011-07-20 ENCOUNTER — Ambulatory Visit (INDEPENDENT_AMBULATORY_CARE_PROVIDER_SITE_OTHER)
Admission: RE | Admit: 2011-07-20 | Discharge: 2011-07-20 | Disposition: A | Discharge status: Home / Self Care | Payer: BC Managed Care – PPO | Source: Ambulatory Visit | Attending: Internal Medicine | Admitting: Internal Medicine

## 2011-07-20 DIAGNOSIS — R079 Chest pain, unspecified: Secondary | ICD-10-CM

## 2011-07-20 DIAGNOSIS — R109 Unspecified abdominal pain: Secondary | ICD-10-CM

## 2011-07-22 ENCOUNTER — Encounter: Payer: Self-pay | Admitting: Internal Medicine

## 2011-07-22 NOTE — Assessment & Plan Note (Signed)
stable overall by hx and exam, most recent data reviewed with pt, and pt to continue medical treatment as before Lab Results  Component Value Date   WBC 7.5 07/17/2011   HGB 14.7 07/17/2011   HCT 42.5 07/17/2011   PLT 236.0 07/17/2011   GLUCOSE 94 07/17/2011   CHOL 151 09/12/2010   TRIG 118.0 09/12/2010   HDL 42.70 09/12/2010   LDLCALC 85 09/12/2010   ALT 23 07/17/2011   AST 20 07/17/2011   NA 136 07/17/2011   K 4.1 07/17/2011   CL 103 07/17/2011   CREATININE 0.9 07/17/2011   BUN 17 07/17/2011   CO2 27 07/17/2011   TSH 1.79 09/12/2010   PSA 2.43 09/12/2010   HGBA1C 6.4 09/12/2010    

## 2011-07-22 NOTE — Progress Notes (Signed)
Subjective:    Patient ID: Paul Tucker, male    DOB: [birth year], 61 y.o.   MRN: [id number]  HPI  Here with 6 wks onset mild abd discomfort, right mid lateral aspect, somewhat tender to touch, nausea with eating, somewhat pleuritic, abd seems distended at times, no fever, has had intermittent constipation, no vomiting, gasx no hlep, rare heartburn only, advil helps some, only very rare ETOH, has hx of gallstones pancreatitis 2005 s/p ccx, worried it might happen again.  Wt has decreased from 254 to 241 more or less intentitionally since July 2012.  Pt denies chest pain, increased sob or doe, wheezing, orthopnea, PND, increased LE swelling, palpitations, dizziness or syncope.  Pt denies new neurological symptoms such as new headache, or facial or extremity weakness or numbness   Pt denies polydipsia, polyuria  Denies worsening depressive symptoms, suicidal ideation, or panic. Wife very concerned today as well Past Medical History  Diagnosis Date  . Dermatophytosis of groin and perianal area 05/31/2008  . THRUSH 04/04/2007  . HYPOGONADISM 06/13/2009  . GLUCOSE INTOLERANCE 04/04/2007  . HYPERLIPIDEMIA 01/20/2007  . DEPRESSION 01/17/2007  . ADD 01/17/2007  . OBSTRUCTIVE SLEEP APNEA 04/04/2007  . OTITIS MEDIA, ACUTE, BILATERAL 06/13/2009  . SINUSITIS- ACUTE-NOS 04/04/2007  . GERD 01/20/2007  . DIVERTICULOSIS, COLON 04/06/2007  . HEMATOCHEZIA 07/26/2008  . Gross hematuria 01/27/2009  . Acute prostatitis 04/04/2007  . 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 LOW BACK PAIN 01/20/2007  . VERTIGO 06/13/2009  . INSOMNIA 04/04/2007  . SWEATING 09/30/2007  . RASH-NONVESICULAR 08/26/2009  . Flushing 08/26/2009  . Dysuria 01/27/2009  . URINARY RETENTION 01/27/2009  . FLANK PAIN, LEFT 09/30/2007  . PSA, INCREASED 11/18/2009  . LIBIDO, DECREASED 12/02/2008  . PANCREATITIS, HX OF 01/17/2007  . TONSILLECTOMY AND ADENOIDECTOMY, HX OF 01/17/2007  . URI 07/25/2010  . Impaired glucose tolerance 12/10/2010   Past Surgical History    Procedure Date  . Cholecystectomy 2005  . Vasectomy 1987  . Hernia repair 1957  . Tonsillectomy     reports that he has never smoked. He does not have any smokeless tobacco history on file. He reports that he drinks alcohol. He reports that he does not use illicit drugs. family history includes Coronary artery disease in his other; Diabetes in his other; Emphysema in his father; and Heart disease in his father. Allergies  Allergen Reactions  . Pravastatin Sodium     REACTION: nausea   Current Outpatient Prescriptions on File Prior to Visit  Medication Sig Dispense Refill  . aspirin 81 MG EC tablet Take 81 mg by mouth daily.        Marland Kitchen ibuprofen (ADVIL,MOTRIN) 200 MG tablet 2-3 by mouth once daily as needed for back pain       . lovastatin (MEVACOR) 20 MG tablet Take 1 tablet (20 mg total) by mouth daily.  90 tablet  3  . meclizine (ANTIVERT) 12.5 MG tablet 1-2 tabs by mouth every 8 hrs as needed for dizziness  50 tablet  1  . sertraline (ZOLOFT) 100 MG tablet Take 1 tablet (100 mg total) by mouth daily.  90 tablet  3  . Tamsulosin HCl (FLOMAX) 0.4 MG CAPS Take 1 capsule (0.4 mg total) by mouth daily.  90 capsule  3   ,Review of Systems Review of Systems  Constitutional: Negative for diaphoresis and unexpected weight change.  HENT: Negative for drooling and tinnitus.   Eyes: Negative for photophobia and visual disturbance.  Respiratory: Negative for choking and stridor.  Gastrointestinal: Negative for vomiting and blood in stool.  Genitourinary: Negative for hematuria and decreased urine volume.  Musculoskeletal: Negative for gait problem.  Skin: Negative for color change and wound.    Objective:   Physical Exam BP 108/70  Pulse 65  Temp(Src) 98.3 F (36.8 C) (Oral)  Ht 6\' 2"  (1.88 m)  Wt 241 lb 4 oz (109.43 kg)  BMI 30.97 kg/m2  SpO2 97% Physical Exam  VS noted, not il appearing Constitutional: Pt appears well-developed and well-nourished.  HENT: Head: Normocephalic.   Right Ear: External ear normal.  Left Ear: External ear normal.  Eyes: Conjunctivae and EOM are normal. Pupils are equal, round, and reactive to light.  Neck: Normal range of motion. Neck supple.  Cardiovascular: Normal rate and regular rhythm.   Pulmonary/Chest: Effort normal and breath sounds normal.  Abd:  Soft, NT, non-distended, + BS except for mild tender right mid aspect abd at the anterior axillary line Neurological: Pt is alert. No cranial nerve deficit.  Skin: Skin is warm. No erythema.  Psychiatric: Pt behavior is normal. Thought content normal. 1+ nervous , not depressed affect    Assessment & Plan:

## 2011-07-22 NOTE — Assessment & Plan Note (Signed)
Also for cxr with pleuritic aspect to pain and tender on exam - Continue all other medications as before,  to f/u any worsening symptoms or concerns

## 2011-07-22 NOTE — Assessment & Plan Note (Signed)
Etiology unclear, diff includes MSK vs other, for abd u/s, labs including h pylori, Continue all other medications as before,  to f/u any worsening symptoms or concerns

## 2011-07-30 ENCOUNTER — Ambulatory Visit (INDEPENDENT_AMBULATORY_CARE_PROVIDER_SITE_OTHER): Payer: BC Managed Care – PPO | Admitting: Internal Medicine

## 2011-07-30 ENCOUNTER — Encounter: Payer: Self-pay | Admitting: Internal Medicine

## 2011-07-30 VITALS — BP 112/72 | HR 85 | Temp 98.4°F | Ht 74.0 in | Wt 244.1 lb

## 2011-07-30 DIAGNOSIS — R109 Unspecified abdominal pain: Secondary | ICD-10-CM

## 2011-07-30 DIAGNOSIS — R7302 Impaired glucose tolerance (oral): Secondary | ICD-10-CM

## 2011-07-30 DIAGNOSIS — R7309 Other abnormal glucose: Secondary | ICD-10-CM

## 2011-07-30 DIAGNOSIS — J029 Acute pharyngitis, unspecified: Secondary | ICD-10-CM

## 2011-07-30 NOTE — Assessment & Plan Note (Signed)
Mild to mod, for antibx course,  to f/u any worsening symptoms or concerns 

## 2011-07-30 NOTE — Progress Notes (Signed)
Subjective:    Patient ID: BENICIO MANNA, male    DOB: 09-Jul-1950, 61 y.o.   MRN: [id number]  HPI   Here with 3 days acute onset fever, severe ST, general weakness and malaise, and mild nonprod cough and Pt denies chest pain, increased sob or doe, wheezing, orthopnea, PND, increased LE swelling, palpitations, dizziness or syncope.  abd pain/cp from last visit has completely resolved, with recent labs and u/s proved neg for acute.    Pt denies new neurological symptoms such as new headache, or facial or extremity weakness or numbness   Pt denies polydipsia, polyuria,   Pt states overall good compliance with meds, trying to follow lower cholesterol diet, wt overall stable but little exercise however.     Past Medical History  Diagnosis Date  . Dermatophytosis of groin and perianal area 05/31/2008  . THRUSH 04/04/2007  . HYPOGONADISM 06/13/2009  . GLUCOSE INTOLERANCE 04/04/2007  . HYPERLIPIDEMIA 01/20/2007  . DEPRESSION 01/17/2007  . ADD 01/17/2007  . OBSTRUCTIVE SLEEP APNEA 04/04/2007  . OTITIS MEDIA, ACUTE, BILATERAL 06/13/2009  . SINUSITIS- ACUTE-NOS 04/04/2007  . GERD 01/20/2007  . DIVERTICULOSIS, COLON 04/06/2007  . HEMATOCHEZIA 07/26/2008  . Gross hematuria 01/27/2009  . Acute prostatitis 04/04/2007  . 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 LOW BACK PAIN 01/20/2007  . VERTIGO 06/13/2009  . INSOMNIA 04/04/2007  . SWEATING 09/30/2007  . RASH-NONVESICULAR 08/26/2009  . Flushing 08/26/2009  . Dysuria 01/27/2009  . URINARY RETENTION 01/27/2009  . FLANK PAIN, LEFT 09/30/2007  . PSA, INCREASED 11/18/2009  . LIBIDO, DECREASED 12/02/2008  . PANCREATITIS, HX OF 01/17/2007  . TONSILLECTOMY AND ADENOIDECTOMY, HX OF 01/17/2007  . URI 07/25/2010  . Impaired glucose tolerance 12/10/2010   Past Surgical History  Procedure Date  . Cholecystectomy 2005  . Vasectomy 1987  . Hernia repair 1957  . Tonsillectomy     reports that he has never smoked. He does not have any smokeless tobacco history on file. He reports that he  drinks alcohol. He reports that he does not use illicit drugs. family history includes Coronary artery disease in his other; Diabetes in his other; Emphysema in his father; and Heart disease in his father. Allergies  Allergen Reactions  . Pravastatin Sodium     REACTION: nausea   Current Outpatient Prescriptions on File Prior to Visit  Medication Sig Dispense Refill  . amphetamine-dextroamphetamine (ADDERALL) 20 MG tablet Take 1 tablet (20 mg total) by mouth 2 (two) times daily. To fill October 25, 2011  60 tablet  0  . aspirin 81 MG EC tablet Take 81 mg by mouth daily.        . Ciclopirox 0.77 % gel Apply 1 application topically 2 (two) times daily.  45 g  1  . fluocinonide cream (LIDEX) 0.05 % Apply topically 2 (two) times daily.  60 g  1  . ibuprofen (ADVIL,MOTRIN) 200 MG tablet 2-3 by mouth once daily as needed for back pain       . lovastatin (MEVACOR) 20 MG tablet Take 1 tablet (20 mg total) by mouth daily.  90 tablet  3  . meclizine (ANTIVERT) 12.5 MG tablet 1-2 tabs by mouth every 8 hrs as needed for dizziness  50 tablet  1  . sertraline (ZOLOFT) 100 MG tablet Take 1 tablet (100 mg total) by mouth daily.  90 tablet  3  . Tamsulosin HCl (FLOMAX) 0.4 MG CAPS Take 1 capsule (0.4 mg total) by mouth daily.  90 capsule  3  .  zolpidem (AMBIEN) 10 MG tablet Take 1 tablet (10 mg total) by mouth at bedtime as needed.  90 tablet  1   Review of Systems Review of Systems  Constitutional: Negative for diaphoresis and unexpected weight change.  HENT: Negative for drooling and tinnitus.   Eyes: Negative for photophobia and visual disturbance.  Respiratory: Negative for choking and stridor.   Gastrointestinal: Negative for vomiting and blood in stool.  Genitourinary: Negative for hematuria and decreased urine volume.     Objective:   Physical ExamBP 112/72  Pulse 85  Temp(Src) 98.4 F (36.9 C) (Oral)  Ht 6\' 2"  (1.88 m)  Wt 244 lb 2 oz (110.734 kg)  BMI 31.34 kg/m2  SpO2 92% Physical Exam  VS  noted, obese, mild ill appearing Constitutional: Pt appears well-developed and well-nourished.  HENT: Head: Normocephalic.  Right Ear: External ear normal.  Left Ear: External ear normal.  Bilat tm's mild erythema.  Sinus nontender.  But Pharynx severe erythema, mild exudate Eyes: Conjunctivae and EOM are normal. Pupils are equal, round, and reactive to light.  Neck: Normal range of motion. Neck supple.  Cardiovascular: Normal rate and regular rhythm.   Pulmonary/Chest: Effort normal and breath sounds normal.  Neurological: Pt is alert. No cranial nerve deficit.  Skin: Skin is warm. No erythema.  Psychiatric: Pt behavior is normal. Thought content normal.     Assessment & Plan:

## 2011-07-30 NOTE — Assessment & Plan Note (Addendum)
stable overall by hx and exam, most recent data reviewed with pt, and pt to continue medical treatment as before, pt to call for cbg > 200, or polys  Lab Results  Component Value Date   HGBA1C 6.4 09/12/2010

## 2011-07-30 NOTE — Patient Instructions (Signed)
Take all new medications as prescribed Continue all other medications as before  

## 2011-07-30 NOTE — Assessment & Plan Note (Signed)
stable overall by hx and exam, most recent data reviewed with pt, and pt to continue medical treatment as before Lab Results  Component Value Date   WBC 7.5 07/17/2011   HGB 14.7 07/17/2011   HCT 42.5 07/17/2011   PLT 236.0 07/17/2011   GLUCOSE 94 07/17/2011   CHOL 151 09/12/2010   TRIG 118.0 09/12/2010   HDL 42.70 09/12/2010   LDLCALC 85 09/12/2010   ALT 23 07/17/2011   AST 20 07/17/2011   NA 136 07/17/2011   K 4.1 07/17/2011   CL 103 07/17/2011   CREATININE 0.9 07/17/2011   BUN 17 07/17/2011   CO2 27 07/17/2011   TSH 1.79 09/12/2010   PSA 2.43 09/12/2010   HGBA1C 6.4 09/12/2010    

## 2011-07-31 ENCOUNTER — Telehealth: Payer: Self-pay

## 2011-07-31 MED ORDER — LEVOFLOXACIN 250 MG PO TABS: 250.0000 mg | ORAL_TABLET | Freq: Every day | ORAL | Status: AC

## 2011-07-31 NOTE — Telephone Encounter (Signed)
Patient informed. 

## 2011-07-31 NOTE — Telephone Encounter (Signed)
Ok to take this med with other meds

## 2011-07-31 NOTE — Telephone Encounter (Signed)
Called left message to call back 

## 2011-07-31 NOTE — Telephone Encounter (Signed)
Patient called requesting confirmation from MD ok to take antibiotic along with other medications. Please advise

## 2011-07-31 NOTE — Telephone Encounter (Signed)
For some reason did not go through;  Re-sent today

## 2011-07-31 NOTE — Telephone Encounter (Signed)
Pt called stating that at OV yesterday he was advised that an ABX would sent to his pharmacy, but Rx is not there. Nothing was sent. Please advise if pt should be on an ABX?

## 2011-08-01 NOTE — Telephone Encounter (Signed)
Patient informed ok to take medications along with antibiotics.

## 2011-10-24 ENCOUNTER — Other Ambulatory Visit: Payer: Self-pay

## 2011-10-24 MED ORDER — SERTRALINE HCL 100 MG PO TABS: 100.0000 mg | ORAL_TABLET | Freq: Every day | ORAL | Status: DC

## 2011-10-24 MED ORDER — TAMSULOSIN HCL 0.4 MG PO CAPS: 0.4000 mg | ORAL_CAPSULE | Freq: Every day | ORAL | Status: DC

## 2011-10-24 MED ORDER — LOVASTATIN 20 MG PO TABS: 20.0000 mg | ORAL_TABLET | Freq: Every day | ORAL | Status: DC

## 2011-11-19 ENCOUNTER — Encounter: Payer: Self-pay | Admitting: Internal Medicine

## 2011-11-19 ENCOUNTER — Ambulatory Visit (INDEPENDENT_AMBULATORY_CARE_PROVIDER_SITE_OTHER): Payer: BC Managed Care – PPO | Admitting: Internal Medicine

## 2011-11-19 VITALS — BP 112/80 | HR 87 | Temp 97.5°F | Ht 73.0 in | Wt 241.5 lb

## 2011-11-19 DIAGNOSIS — R7309 Other abnormal glucose: Secondary | ICD-10-CM

## 2011-11-19 DIAGNOSIS — F329 Major depressive disorder, single episode, unspecified: Secondary | ICD-10-CM

## 2011-11-19 DIAGNOSIS — R7302 Impaired glucose tolerance (oral): Secondary | ICD-10-CM

## 2011-11-19 DIAGNOSIS — R42 Dizziness and giddiness: Secondary | ICD-10-CM | POA: Insufficient documentation

## 2011-11-19 NOTE — Progress Notes (Signed)
Subjective:    Patient ID: Paul Tucker, male    DOB: 28-Sep-1950, 61 y.o.   MRN: [id number]  HPI Here with episode of vertigo marked occurred 4 days ago at noon, had to have someone drive him home, assoc with dry heave x 2, took meclizine 25 mg and layed down the rest of the day with some improvement by the next day;  Near resolved the last 2 days it seems, No fever, may have ha chill or two, and occas HA, but no ear pain or pressure/popping, no ST or sinus issues/congestion;  Has an occas sweat off and on,not related to stress. Pt denies chest pain, increased sob or doe, wheezing, orthopnea, PND, increased LE swelling, palpitations, dizziness or syncope.  Pt denies new neurological symptoms such as new headache, or facial or extremity weakness or numbness.   Pt denies polydipsia, polyuria. Recnet labs feb 2013 and cxr reviewed with pt - stable.   Pt denies fever, wt loss, night sweats, loss of appetite, or other constitutional symptoms  Overall good compliance with treatment, and good medicine tolerability. Denies worsening depressive symptoms, suicidal ideation, or panic Past Medical History  Diagnosis Date  . Dermatophytosis of groin and perianal area 05/31/2008  . THRUSH 04/04/2007  . HYPOGONADISM 06/13/2009  . GLUCOSE INTOLERANCE 04/04/2007  . HYPERLIPIDEMIA 01/20/2007  . DEPRESSION 01/17/2007  . ADD 01/17/2007  . OBSTRUCTIVE SLEEP APNEA 04/04/2007  . OTITIS MEDIA, ACUTE, BILATERAL 06/13/2009  . SINUSITIS- ACUTE-NOS 04/04/2007  . GERD 01/20/2007  . DIVERTICULOSIS, COLON 04/06/2007  . HEMATOCHEZIA 07/26/2008  . Gross hematuria 01/27/2009  . Acute prostatitis 04/04/2007  . 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 LOW BACK PAIN 01/20/2007  . VERTIGO 06/13/2009  . INSOMNIA 04/04/2007  . SWEATING 09/30/2007  . RASH-NONVESICULAR 08/26/2009  . Flushing 08/26/2009  . Dysuria 01/27/2009  . URINARY RETENTION 01/27/2009  . FLANK PAIN, LEFT 09/30/2007  . PSA, INCREASED 11/18/2009  . LIBIDO, DECREASED 12/02/2008  .  PANCREATITIS, HX OF 01/17/2007  . TONSILLECTOMY AND ADENOIDECTOMY, HX OF 01/17/2007  . URI 07/25/2010  . Impaired glucose tolerance 12/10/2010   Past Surgical History  Procedure Date  . Cholecystectomy 2005  . Vasectomy 1987  . Hernia repair 1957  . Tonsillectomy     reports that he has never smoked. He does not have any smokeless tobacco history on file. He reports that he drinks alcohol. He reports that he does not use illicit drugs. family history includes Coronary artery disease in his other; Diabetes in his other; Emphysema in his father; and Heart disease in his father. Allergies  Allergen Reactions  . Pravastatin Sodium     REACTION: nausea   Current Outpatient Prescriptions on File Prior to Visit  Medication Sig Dispense Refill  . amphetamine-dextroamphetamine (ADDERALL) 20 MG tablet Take 1 tablet (20 mg total) by mouth 2 (two) times daily. To fill October 25, 2011  60 tablet  0  . aspirin 81 MG EC tablet Take 81 mg by mouth daily.        . fluocinonide cream (LIDEX) 0.05 % Apply topically 2 (two) times daily.  60 g  1  . ibuprofen (ADVIL,MOTRIN) 200 MG tablet 2-3 by mouth once daily as needed for back pain       . lovastatin (MEVACOR) 20 MG tablet Take 1 tablet (20 mg total) by mouth daily.  90 tablet  3  . meclizine (ANTIVERT) 12.5 MG tablet 1-2 tabs by mouth every 8 hrs as needed for dizziness  50 tablet  1  . sertraline (ZOLOFT) 100 MG tablet Take 1 tablet (100 mg total) by mouth daily.  90 tablet  3  . Tamsulosin HCl (FLOMAX) 0.4 MG CAPS Take 1 capsule (0.4 mg total) by mouth daily.  90 capsule  3  . zolpidem (AMBIEN) 10 MG tablet Take 1 tablet (10 mg total) by mouth at bedtime as needed.  90 tablet  1  . zolpidem (AMBIEN CR) 6.25 MG CR tablet Take 1 tablet (6.25 mg total) by mouth at bedtime as needed for sleep.  90 tablet  1  . zolpidem (AMBIEN CR) 6.25 MG CR tablet Take 1 tablet (6.25 mg total) by mouth at bedtime as needed for sleep.  90 tablet  1   Review of  Systems Constitutional: Negative for diaphoresis and unexpected weight change.  HENT: Negative for drooling and tinnitus.   Eyes: Negative for photophobia and visual disturbance.  Respiratory: Negative for choking and stridor.   Gastrointestinal: Negative forblood in stool.  Genitourinary: Negative for hematuria and decreased urine volume.  Musculoskeletal: Negative for gait problem.  Skin: Negative for color change and wound.     Objective:   Physical Exam BP 112/80  Pulse 87  Temp 97.5 F (36.4 C) (Oral)  Ht 6\' 1"  (1.854 m)  Wt 241 lb 8 oz (109.544 kg)  BMI 31.86 kg/m2  SpO2 95% Physical Exam  VS noted, mild nauseated appearing Constitutional: Pt appears well-developed and well-nourished.  HENT: Head: Normocephalic.  Right Ear: External ear normal.  Left Ear: External ear normal.  Bilat tm's no erythema,  Sinus nontender.  Pharynx no erythema Eyes: Conjunctivae and EOM are normal. Pupils are equal, round, and reactive to light.  Neck: Normal range of motion. Neck supple.  Cardiovascular: Normal rate and regular rhythm.   Pulmonary/Chest: Effort normal and breath sounds normal.  Abd:  Soft, NT, non-distended, + BS - benign exam Neurological: Pt is alert. No cranial nerve deficit. motor/gait intact Skin: Skin is warm. No erythema. No rash Psychiatric: Pt behavior is normal. Thought content normal. not depressed affect    Assessment & Plan:

## 2011-11-19 NOTE — Patient Instructions (Addendum)
You appear to have had Benign Positional Vertigo Please use the information provided to practice the maneuvers that work to decrease the symptoms Continue all other medications as before, including the meclizine as needed Please have the pharmacy call with any refills you may need.

## 2011-11-19 NOTE — Assessment & Plan Note (Addendum)
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

## 2011-11-24 ENCOUNTER — Encounter: Payer: Self-pay | Admitting: Internal Medicine

## 2011-11-24 NOTE — Assessment & Plan Note (Signed)
stable overall by hx and exam, most recent data reviewed with pt, and pt to continue medical treatment as before Lab Results  Component Value Date   WBC 7.5 07/17/2011   HGB 14.7 07/17/2011   HCT 42.5 07/17/2011   PLT 236.0 07/17/2011   GLUCOSE 94 07/17/2011   CHOL 151 09/12/2010   TRIG 118.0 09/12/2010   HDL 42.70 09/12/2010   LDLCALC 85 09/12/2010   ALT 23 07/17/2011   AST 20 07/17/2011   NA 136 07/17/2011   K 4.1 07/17/2011   CL 103 07/17/2011   CREATININE 0.9 07/17/2011   BUN 17 07/17/2011   CO2 27 07/17/2011   TSH 1.79 09/12/2010   PSA 2.43 09/12/2010   HGBA1C 6.4 09/12/2010

## 2011-11-24 NOTE — Assessment & Plan Note (Signed)
stable overall by hx and exam, most recent data reviewed with pt, and pt to continue medical treatment as before Lab Results  Component Value Date   HGBA1C 6.4 09/12/2010

## 2011-11-27 ENCOUNTER — Telehealth: Payer: Self-pay

## 2011-11-27 MED ORDER — ZOLPIDEM TARTRATE 10 MG PO TABS: 10.0000 mg | ORAL_TABLET | Freq: Every evening | ORAL | Status: DC | PRN

## 2011-11-27 MED ORDER — SERTRALINE HCL 100 MG PO TABS: 100.0000 mg | ORAL_TABLET | Freq: Every day | ORAL | Status: DC

## 2011-11-27 MED ORDER — LOVASTATIN 20 MG PO TABS: 20.0000 mg | ORAL_TABLET | Freq: Every day | ORAL | Status: DC

## 2011-11-27 MED ORDER — TAMSULOSIN HCL 0.4 MG PO CAPS: 0.4000 mg | ORAL_CAPSULE | Freq: Every day | ORAL | Status: DC

## 2011-11-27 NOTE — Telephone Encounter (Signed)
Pt called requesting refills of lovastatin, Flomax Ambien, and zoloft to [facility]. Rx have already been sent for all requested medication except Ambien, please advise on refill for this medicine?

## 2011-11-27 NOTE — Telephone Encounter (Signed)
Done hardcopy to robin  

## 2011-11-27 NOTE — Telephone Encounter (Signed)
Called patient informed prescriptions sent in to [facility] Faxed hardcopy to [facility]

## 2011-11-29 ENCOUNTER — Other Ambulatory Visit (INDEPENDENT_AMBULATORY_CARE_PROVIDER_SITE_OTHER): Payer: BC Managed Care – PPO

## 2011-11-29 DIAGNOSIS — Z0000 Encounter for general adult medical examination without abnormal findings: Secondary | ICD-10-CM

## 2011-11-29 DIAGNOSIS — E739 Lactose intolerance, unspecified: Secondary | ICD-10-CM

## 2011-11-29 LAB — BASIC METABOLIC PANEL WITH GFR
BUN: 16 mg/dL (ref 6–23)
CO2: 27 meq/L (ref 19–32)
Calcium: 9.3 mg/dL (ref 8.4–10.5)
Chloride: 105 meq/L (ref 96–112)
Creatinine, Ser: 1 mg/dL (ref 0.4–1.5)
GFR: 78.25 mL/min
Glucose, Bld: 150 mg/dL — ABNORMAL HIGH (ref 70–99)
Potassium: 4.8 meq/L (ref 3.5–5.1)
Sodium: 139 meq/L (ref 135–145)

## 2011-11-29 LAB — URINALYSIS, ROUTINE W REFLEX MICROSCOPIC
Bilirubin Urine: NEGATIVE
Hgb urine dipstick: NEGATIVE
Ketones, ur: NEGATIVE
Leukocytes, UA: NEGATIVE
Nitrite: NEGATIVE
Specific Gravity, Urine: 1.025
Total Protein, Urine: NEGATIVE
Urine Glucose: NEGATIVE
Urobilinogen, UA: 0.2
pH: 5.5 (ref 5.0–8.0)

## 2011-11-29 LAB — CBC WITH DIFFERENTIAL/PLATELET
Basophils Absolute: 0 K/uL (ref 0.0–0.1)
Basophils Relative: 0.5 % (ref 0.0–3.0)
Eosinophils Absolute: 0.2 K/uL (ref 0.0–0.7)
Eosinophils Relative: 3.6 % (ref 0.0–5.0)
HCT: 41.6 % (ref 39.0–52.0)
Hemoglobin: 14.3 g/dL (ref 13.0–17.0)
Lymphocytes Relative: 23.6 % (ref 12.0–46.0)
Lymphs Abs: 1.5 K/uL (ref 0.7–4.0)
MCHC: 34.4 g/dL (ref 30.0–36.0)
MCV: 97.5 fl (ref 78.0–100.0)
Monocytes Absolute: 0.4 K/uL (ref 0.1–1.0)
Monocytes Relative: 6.6 % (ref 3.0–12.0)
Neutro Abs: 4.1 K/uL (ref 1.4–7.7)
Neutrophils Relative %: 65.7 % (ref 43.0–77.0)
Platelets: 228 K/uL (ref 150.0–400.0)
RBC: 4.26 Mil/uL (ref 4.22–5.81)
RDW: 12.8 % (ref 11.5–14.6)
WBC: 6.3 K/uL (ref 4.5–10.5)

## 2011-11-29 LAB — LIPID PANEL
Cholesterol: 172 mg/dL (ref 0–200)
Triglycerides: 125 mg/dL (ref 0.0–149.0)

## 2011-11-29 LAB — HEPATIC FUNCTION PANEL
ALT: 25 U/L (ref 0–53)
AST: 21 U/L (ref 0–37)
Albumin: 4.3 g/dL (ref 3.5–5.2)
Alkaline Phosphatase: 73 U/L (ref 39–117)
Bilirubin, Direct: 0.2 mg/dL (ref 0.0–0.3)
Total Bilirubin: 0.8 mg/dL (ref 0.3–1.2)
Total Protein: 7.6 g/dL (ref 6.0–8.3)

## 2011-11-29 LAB — PSA: PSA: 3.54 ng/mL (ref 0.10–4.00)

## 2011-12-04 ENCOUNTER — Ambulatory Visit (INDEPENDENT_AMBULATORY_CARE_PROVIDER_SITE_OTHER): Payer: BC Managed Care – PPO | Admitting: Internal Medicine

## 2011-12-04 ENCOUNTER — Encounter: Payer: Self-pay | Admitting: Internal Medicine

## 2011-12-04 VITALS — BP 122/80 | HR 73 | Temp 98.4°F | Ht 74.0 in | Wt 249.4 lb

## 2011-12-04 DIAGNOSIS — R202 Paresthesia of skin: Secondary | ICD-10-CM

## 2011-12-04 DIAGNOSIS — R972 Elevated prostate specific antigen [PSA]: Secondary | ICD-10-CM

## 2011-12-04 DIAGNOSIS — Z2911 Encounter for prophylactic immunotherapy for respiratory syncytial virus (RSV): Secondary | ICD-10-CM

## 2011-12-04 DIAGNOSIS — Z23 Encounter for immunization: Secondary | ICD-10-CM

## 2011-12-04 DIAGNOSIS — R209 Unspecified disturbances of skin sensation: Secondary | ICD-10-CM

## 2011-12-04 DIAGNOSIS — Z0000 Encounter for general adult medical examination without abnormal findings: Secondary | ICD-10-CM

## 2011-12-04 MED ORDER — ZOLPIDEM TARTRATE 10 MG PO TABS: 10.0000 mg | ORAL_TABLET | Freq: Every evening | ORAL | Status: DC | PRN

## 2011-12-04 NOTE — Assessment & Plan Note (Signed)
Ok for b12 with next lab draw, ? Peripheral neuropathy vs trauma with recent jumping a shovel incident with unearthing a tree,  to f/u any worsening symptoms or concerns

## 2011-12-04 NOTE — Patient Instructions (Addendum)
You had the shingle shot today  Please return in 6 months to repeat the PSA and B12 only You will be contacted by phone if any changes need to be made immediately.  Otherwise, you will receive a letter about your results with an explanation. Your Remus Loffler will be refilled to the [facility] pharmacy Please return in 1 year for your yearly visit, or sooner if needed, with Lab testing done 3-5 days before

## 2011-12-04 NOTE — Assessment & Plan Note (Signed)

## 2011-12-04 NOTE — Progress Notes (Signed)
Subjective:    Patient ID: Paul Tucker, male    DOB: [birth year], 61 y.o.   MRN: [id number]  HPI  Here for wellness and f/u;  Overall doing ok;  Pt denies CP, worsening SOB, DOE, wheezing, orthopnea, PND, worsening LE edema, palpitations, dizziness or syncope.  Pt denies neurological change such as new Headache, facial or extremity weakness.  Pt denies polydipsia, polyuria, or low sugar symptoms. Pt states overall good compliance with treatment and medications, good tolerability, and trying to follow lower cholesterol diet.  Pt denies worsening depressive symptoms, suicidal ideation or panic. No fever, wt loss, night sweats, loss of appetite, or other constitutional symptoms.  Pt states good ability with ADL's, low fall risk, home safety reviewed and adequate, no significant changes in hearing or vision, and occasionally active with exercise.  Has been trying to stop adderal in th epast 2 wks.  Wt had been down, then back up to 249 , but stil less than 254 last yr.  Denies worsening prostatism.  Has had occasional paresthesias to extremities. Past Medical History  Diagnosis Date  . Dermatophytosis of groin and perianal area 05/31/2008  . THRUSH 04/04/2007  . HYPOGONADISM 06/13/2009  . GLUCOSE INTOLERANCE 04/04/2007  . HYPERLIPIDEMIA 01/20/2007  . DEPRESSION 01/17/2007  . ADD 01/17/2007  . OBSTRUCTIVE SLEEP APNEA 04/04/2007  . OTITIS MEDIA, ACUTE, BILATERAL 06/13/2009  . SINUSITIS- ACUTE-NOS 04/04/2007  . GERD 01/20/2007  . DIVERTICULOSIS, COLON 04/06/2007  . HEMATOCHEZIA 07/26/2008  . Gross hematuria 01/27/2009  . Acute prostatitis 04/04/2007  . 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 LOW BACK PAIN 01/20/2007  . VERTIGO 06/13/2009  . INSOMNIA 04/04/2007  . SWEATING 09/30/2007  . RASH-NONVESICULAR 08/26/2009  . Flushing 08/26/2009  . Dysuria 01/27/2009  . URINARY RETENTION 01/27/2009  . FLANK PAIN, LEFT 09/30/2007  . PSA, INCREASED 11/18/2009  . LIBIDO, DECREASED 12/02/2008  . PANCREATITIS, HX OF 01/17/2007  .  TONSILLECTOMY AND ADENOIDECTOMY, HX OF 01/17/2007  . URI 07/25/2010  . Impaired glucose tolerance 12/10/2010   Past Surgical History  Procedure Date  . Cholecystectomy 2005  . Vasectomy 1987  . Hernia repair 1957  . Tonsillectomy     reports that he has never smoked. He does not have any smokeless tobacco history on file. He reports that he drinks alcohol. He reports that he does not use illicit drugs. family history includes Coronary artery disease in his other; Diabetes in his other; Emphysema in his father; and Heart disease in his father. Allergies  Allergen Reactions  . Pravastatin Sodium     REACTION: nausea   Current Outpatient Prescriptions on File Prior to Visit  Medication Sig Dispense Refill  . aspirin 81 MG EC tablet Take 81 mg by mouth daily.        . fluocinonide cream (LIDEX) 0.05 % Apply topically 2 (two) times daily.  60 g  1  . ibuprofen (ADVIL,MOTRIN) 200 MG tablet 2-3 by mouth once daily as needed for back pain       . lovastatin (MEVACOR) 20 MG tablet Take 1 tablet (20 mg total) by mouth daily.  90 tablet  3  . meclizine (ANTIVERT) 12.5 MG tablet 1-2 tabs by mouth every 8 hrs as needed for dizziness  50 tablet  1  . sertraline (ZOLOFT) 100 MG tablet Take 1 tablet (100 mg total) by mouth daily.  90 tablet  3  . Tamsulosin HCl (FLOMAX) 0.4 MG CAPS Take 1 capsule (0.4 mg total) by mouth daily.  90 capsule  3  . zolpidem (AMBIEN) 10 MG tablet Take 1 tablet (10 mg total) by mouth at bedtime as needed. To fill Jan 08, 2012  90 tablet  1  . amphetamine-dextroamphetamine (ADDERALL) 20 MG tablet Take 1 tablet (20 mg total) by mouth 2 (two) times daily. To fill October 25, 2011  60 tablet  0   Review of Systems Review of Systems  Constitutional: Negative for diaphoresis, activity change, appetite change and unexpected weight change.  HENT: Negative for hearing loss, ear pain, facial swelling, mouth sores and neck stiffness.   Eyes: Negative for pain, redness and visual  disturbance.  Respiratory: Negative for shortness of breath and wheezing.   Cardiovascular: Negative for chest pain and palpitations.  Gastrointestinal: Negative for diarrhea, blood in stool, abdominal distention and rectal pain.  Genitourinary: Negative for hematuria, flank pain and decreased urine volume.  Musculoskeletal: Negative for myalgias and joint swelling.  Skin: Negative for color change and wound.  Neurological: Negative for syncope and numbness.  Hematological: Negative for adenopathy.  Psychiatric/Behavioral: Negative for hallucinations, self-injury, decreased concentration and agitation.      Objective:   Physical Exam BP 122/80  Pulse 73  Temp 98.4 F (36.9 C) (Oral)  Ht 6\' 2"  (1.88 m)  Wt 249 lb 6 oz (113.116 kg)  BMI 32.02 kg/m2  SpO2 91% Physical Exam  VS noted Constitutional: Pt is oriented to person, place, and time. Appears well-developed and well-nourished.  HENT:  Head: Normocephalic and atraumatic.  Right Ear: External ear normal.  Left Ear: External ear normal.  Nose: Nose normal.  Mouth/Throat: Oropharynx is clear and moist.  Eyes: Conjunctivae and EOM are normal. Pupils are equal, round, and reactive to light.  Neck: Normal range of motion. Neck supple. No JVD present. No tracheal deviation present.  Cardiovascular: Normal rate, regular rhythm, normal heart sounds and intact distal pulses.   Pulmonary/Chest: Effort normal and breath sounds normal.  Abdominal: Soft. Bowel sounds are normal. There is no tenderness.  Musculoskeletal: Normal range of motion. Exhibits no edema.  Lymphadenopathy:  Has no cervical adenopathy.  Neurological: Pt is alert and oriented to person, place, and time. Pt has normal reflexes. No cranial nerve deficit.  Skin: Skin is warm and dry. No rash noted.  Psychiatric:  Has  normal mood and affect. Behavior is normal.     Assessment & Plan:

## 2011-12-16 ENCOUNTER — Encounter: Payer: Self-pay | Admitting: Internal Medicine

## 2011-12-16 NOTE — Assessment & Plan Note (Signed)
For fu psa 

## 2012-01-29 ENCOUNTER — Other Ambulatory Visit: Payer: Self-pay

## 2012-01-29 MED ORDER — ZOLPIDEM TARTRATE 10 MG PO TABS: 10.0000 mg | ORAL_TABLET | Freq: Every evening | ORAL | Status: DC | PRN

## 2012-01-29 NOTE — Telephone Encounter (Signed)
Faxed hardcopy to pharmacy. 

## 2012-01-29 NOTE — Telephone Encounter (Signed)
Done hardcopy to robin  

## 2012-05-02 ENCOUNTER — Ambulatory Visit (INDEPENDENT_AMBULATORY_CARE_PROVIDER_SITE_OTHER): Payer: BC Managed Care – PPO | Admitting: Internal Medicine

## 2012-05-02 ENCOUNTER — Encounter: Payer: Self-pay | Admitting: Internal Medicine

## 2012-05-02 VITALS — BP 122/70 | HR 77 | Temp 98.3°F | Ht 74.0 in | Wt 249.0 lb

## 2012-05-02 DIAGNOSIS — J029 Acute pharyngitis, unspecified: Secondary | ICD-10-CM

## 2012-05-02 MED ORDER — AMOXICILLIN-POT CLAVULANATE 875-125 MG PO TABS: 1.0000 | ORAL_TABLET | Freq: Two times a day (BID) | ORAL | Status: DC

## 2012-05-02 NOTE — Patient Instructions (Signed)

## 2012-05-02 NOTE — Progress Notes (Signed)
Subjective:    Patient ID: Paul Tucker, male    DOB: 05/28/1950, 61 y.o.   MRN: [id number]  HPI  Pt presents to the clinic today with c/o sore throat. This started 10 days ago. He also c/o right ear pain and a  Dry unproductive cough. He has taken cough drops OTC and Mucinex DM without any relief. He denies fever, chills, nausea, vomiting or body aches. He is unsure if he has had sick contacts.  Review of Systems      Past Medical History  Diagnosis Date  . Dermatophytosis of groin and perianal area 05/31/2008  . THRUSH 04/04/2007  . HYPOGONADISM 06/13/2009  . GLUCOSE INTOLERANCE 04/04/2007  . HYPERLIPIDEMIA 01/20/2007  . DEPRESSION 01/17/2007  . ADD 01/17/2007  . OBSTRUCTIVE SLEEP APNEA 04/04/2007  . OTITIS MEDIA, ACUTE, BILATERAL 06/13/2009  . SINUSITIS- ACUTE-NOS 04/04/2007  . GERD 01/20/2007  . DIVERTICULOSIS, COLON 04/06/2007  . HEMATOCHEZIA 07/26/2008  . Gross hematuria 01/27/2009  . Acute prostatitis 04/04/2007  . 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 LOW BACK PAIN 01/20/2007  . VERTIGO 06/13/2009  . INSOMNIA 04/04/2007  . SWEATING 09/30/2007  . RASH-NONVESICULAR 08/26/2009  . Flushing 08/26/2009  . Dysuria 01/27/2009  . URINARY RETENTION 01/27/2009  . FLANK PAIN, LEFT 09/30/2007  . PSA, INCREASED 11/18/2009  . LIBIDO, DECREASED 12/02/2008  . PANCREATITIS, HX OF 01/17/2007  . TONSILLECTOMY AND ADENOIDECTOMY, HX OF 01/17/2007  . URI 07/25/2010  . Impaired glucose tolerance 12/10/2010    Current Outpatient Prescriptions  Medication Sig Dispense Refill  . aspirin 81 MG EC tablet Take 81 mg by mouth daily.        . fluocinonide cream (LIDEX) 0.05 % Apply topically 2 (two) times daily.  60 g  1  . ibuprofen (ADVIL,MOTRIN) 200 MG tablet 2-3 by mouth once daily as needed for back pain       . lovastatin (MEVACOR) 20 MG tablet Take 1 tablet (20 mg total) by mouth daily.  90 tablet  3  . meclizine (ANTIVERT) 12.5 MG tablet 1-2 tabs by mouth every 8 hrs as needed for dizziness  50 tablet  1  .  sertraline (ZOLOFT) 100 MG tablet Take 1 tablet (100 mg total) by mouth daily.  90 tablet  3  . Tamsulosin HCl (FLOMAX) 0.4 MG CAPS Take 1 capsule (0.4 mg total) by mouth daily.  90 capsule  3  . zolpidem (AMBIEN) 10 MG tablet Take 1 tablet (10 mg total) by mouth at bedtime as needed. To fill Jan 08, 2012  90 tablet  1    Allergies  Allergen Reactions  . Pravastatin Sodium     REACTION: nausea    Family History  Problem Relation Age of Onset  . Emphysema Father   . Heart disease Father   . Coronary artery disease Other     male, 1st degree relative  . Diabetes Other     1st degree relative    History   Social History  . Marital Status: Married    Spouse Name: N/A    Number of Children: 3  . Years of Education: N/A   Occupational History  . [employer] Director    Social History Main Topics  . Smoking status: Never Smoker   . Smokeless tobacco: Not on file  . Alcohol Use: Yes  . Drug Use: No  . Sexually Active: Not on file   Other Topics Concern  . Not on file   Social History Narrative  . No  narrative on file     Constitutional: Pt reports malaise. Denies fever, fatigue, headache or abrupt weight changes.  HEENT: Pt reports sore throat. Denies eye pain, eye redness, ear pain, ringing in the ears, wax buildup, runny nose, nasal congestion, bloody nose. Respiratory: Pt reports dry cough. Denies difficulty breathing, shortness of breath or sputum production.   Gastrointestinal: Denies nausea, vomiting, abdominal pain, bloating, constipation, diarrhea or blood in the stool.  Neurological: Denies dizziness, difficulty with memory, difficulty with speech or problems with balance and coordination.   No other specific complaints in a complete review of systems (except as listed in HPI above).  Objective:   Physical Exam  BP 122/70  Pulse 77  Temp 98.3 F (36.8 C) (Oral)  Ht 6\' 2"  (1.88 m)  Wt 249 lb (112.946 kg)  BMI 31.97 kg/m2  SpO2 95% Wt Readings from  Last 3 Encounters:  05/02/12 249 lb (112.946 kg)  12/04/11 249 lb 6 oz (113.116 kg)  11/19/11 241 lb 8 oz (109.544 kg)    General: Appears his stated age, well developed, well nourished in NAD. Skin: Warm, dry and intact. No rashes, lesions or ulcerations noted. HEENT: Head: normal shape and size; Eyes: sclera white, no icterus, conjunctiva pink, PERRLA and EOMs intact; Ears: Tm's gray and intact, normal light reflex; Nose: mucosa pink and moist, septum midline; Throat/Mouth: Teeth present, mucosa erythematous and moist, + PND, no exudate, lesions or ulcerations noted.  Neck: Normal range of motion. Neck supple, trachea midline. No massses, lumps or thyromegaly present.  Cardiovascular: Normal rate and rhythm. S1,S2 noted.  No murmur, rubs or gallops noted. No JVD or BLE edema. No carotid bruits noted. Pulmonary/Chest: Normal effort and positive vesicular breath sounds. No respiratory distress. No wheezes, rales or ronchi noted.       Assessment & Plan:   Pharyngitis, new onset with additional workup required:  Rapid Strep Test Augmentin BID x 10 days  RTC as needed or if symptoms persist

## 2012-05-13 ENCOUNTER — Telehealth: Payer: Self-pay | Admitting: Internal Medicine

## 2012-05-13 NOTE — Telephone Encounter (Signed)
Patient Information:  Caller Name: [place]  Phone: [phone]  Patient: Paul Tucker, Paul Tucker  Gender: Male  DOB: 1950-07-10  Age: 61 Years  PCP: Oliver Barre (Adults only)  Office Follow Up:  Does the office need to follow up with this patient?: No  Instructions For The Office: N/A   Symptoms  Reason For Call & Symptoms: Patient seen on the 05/02/12 and was diagnosed with sore throat and provided Augmentin x10days. He has completed all medication.  The sore throat never went away. No pus noted but "very red". Slight ear pain in right ear off/on. Sinus drainage  occasional feels mucus running down throat  Reviewed Health History In EMR: Yes  Reviewed Medications In EMR: Yes  Reviewed Allergies In EMR: Yes  Reviewed Surgeries / Procedures: No  Date of Onset of Symptoms: 04/22/2012  Treatments Tried: Augmentin , Mucinex  Treatments Tried Worked: No  Guideline(s) Used:  Sore Throat  Sinus Pain and Congestion  Disposition Per Guideline:   See Today or Tomorrow in Office  Reason For Disposition Reached:   Sinus congestion (pressure, fullness) present > 10 days  Advice Given:  For Relief of Sore Throat Pain:  Sip warm chicken broth or apple juice.  Suck on hard candy or a throat lozenge (over-the-counter).  Gargle warm salt water 3 times daily (1 teaspoon of salt in 8 oz or 240 ml of warm water).  Avoid cigarette smoke.  Soft Diet:   Cold drinks and milk shakes are especially good (Reason: swollen tonsils can make some foods hard to swallow).  Liquids:  Adequate liquid intake is important to prevent dehydration. Drink 6-8 glasses of water per day.  Nasal Decongestants for a Very Stuffy or Runny Nose:  Pseudoephedrine (Sudafed) is available OTC in pill form. Typical adult dosage is two 30 mg tablets every 6 hours.  Reassurance:   Sinus congestion is a normal part of a cold.  Usually home treatment with nasal washes can prevent an actual bacterial sinus infection.  Hydration:  Drink  plenty of liquids (6-8 glasses of water daily). If the air in your home is dry, use a cool mist humidifier  PATIENT WANTS TO TRY ZYRTEC PER BOX INSTRUCITONS TO DRY OUT SINUS DRAINAGE. HE WILL CONTACT THE OFFICE FOR NO IMPROVEMENT. DECLINED [facility]

## 2012-06-11 ENCOUNTER — Ambulatory Visit (INDEPENDENT_AMBULATORY_CARE_PROVIDER_SITE_OTHER): Payer: BC Managed Care – PPO | Admitting: Internal Medicine

## 2012-06-11 ENCOUNTER — Encounter: Payer: Self-pay | Admitting: Internal Medicine

## 2012-06-11 VITALS — BP 108/70 | HR 86 | Temp 99.2°F | Ht 74.0 in | Wt 245.0 lb

## 2012-06-11 DIAGNOSIS — J209 Acute bronchitis, unspecified: Secondary | ICD-10-CM

## 2012-06-11 DIAGNOSIS — M25559 Pain in unspecified hip: Secondary | ICD-10-CM

## 2012-06-11 DIAGNOSIS — M25551 Pain in right hip: Secondary | ICD-10-CM | POA: Insufficient documentation

## 2012-06-11 DIAGNOSIS — R062 Wheezing: Secondary | ICD-10-CM

## 2012-06-11 MED ORDER — LEVOFLOXACIN 250 MG PO TABS: 250.0000 mg | ORAL_TABLET | Freq: Every day | ORAL | Status: DC

## 2012-06-11 MED ORDER — PREDNISONE 10 MG PO TABS: ORAL_TABLET | ORAL | Status: DC

## 2012-06-11 MED ORDER — HYDROCODONE-HOMATROPINE 5-1.5 MG/5ML PO SYRP: 5.0000 mL | ORAL_SOLUTION | Freq: Four times a day (QID) | ORAL | Status: DC | PRN

## 2012-06-11 NOTE — Assessment & Plan Note (Signed)
With large hematoma/bruising over the right greater trochanter o/w uncomplicated, afeb without cellulitis, does have some more distal leg bruising due to gravity effects; d/w pt - no need stopping asa now, has antibx today rx but not needed specifically for this issue, should cont to heal over about 6 wks without other specific tx,  to f/u any worsening symptoms or concerns

## 2012-06-11 NOTE — Progress Notes (Signed)
Subjective:    Patient ID: Paul Tucker, male    DOB: 06-Feb-1951, 62 y.o.   MRN: [id number]  HPI  Here to f/u, still c/o infectious type symptoms off and on for a month, but particularly - Here with acute onset mild to mod 2-3 days ST, HA, general weakness and malaise, with prod cough greenish sputum, but Pt denies chest pain, increased sob or doe, wheezing, orthopnea, PND, increased LE swelling, palpitations, dizziness or syncope, except for onset mild wheezing x 2 days as well, with mild doe.  Also mentions an unfortunate episode of forcefully running into a hard pew at church as he was walking towards someone with subsequent large pain/swelling with bruising seeming to envelop most of the thigh to the leg and tender swelling area persists.  Pt denies new neurological symptoms such as new headache, or facial or extremity weakness or numbness   Pt denies polydipsia, polyuria Past Medical History  Diagnosis Date  . Dermatophytosis of groin and perianal area 05/31/2008  . THRUSH 04/04/2007  . HYPOGONADISM 06/13/2009  . GLUCOSE INTOLERANCE 04/04/2007  . HYPERLIPIDEMIA 01/20/2007  . DEPRESSION 01/17/2007  . ADD 01/17/2007  . OBSTRUCTIVE SLEEP APNEA 04/04/2007  . OTITIS MEDIA, ACUTE, BILATERAL 06/13/2009  . SINUSITIS- ACUTE-NOS 04/04/2007  . GERD 01/20/2007  . DIVERTICULOSIS, COLON 04/06/2007  . HEMATOCHEZIA 07/26/2008  . Gross hematuria 01/27/2009  . Acute prostatitis 04/04/2007  . 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 LOW BACK PAIN 01/20/2007  . VERTIGO 06/13/2009  . INSOMNIA 04/04/2007  . SWEATING 09/30/2007  . RASH-NONVESICULAR 08/26/2009  . Flushing 08/26/2009  . Dysuria 01/27/2009  . URINARY RETENTION 01/27/2009  . FLANK PAIN, LEFT 09/30/2007  . PSA, INCREASED 11/18/2009  . LIBIDO, DECREASED 12/02/2008  . PANCREATITIS, HX OF 01/17/2007  . TONSILLECTOMY AND ADENOIDECTOMY, HX OF 01/17/2007  . URI 07/25/2010  . Impaired glucose tolerance 12/10/2010   Past Surgical History  Procedure Date  . Cholecystectomy  2005  . Vasectomy 1987  . Hernia repair 1957  . Tonsillectomy     reports that he has never smoked. He does not have any smokeless tobacco history on file. He reports that he drinks alcohol. He reports that he does not use illicit drugs. family history includes Coronary artery disease in his other; Diabetes in his other; Emphysema in his father; and Heart disease in his father. Allergies  Allergen Reactions  . Pravastatin Sodium     REACTION: nausea   Current Outpatient Prescriptions on File Prior to Visit  Medication Sig Dispense Refill  . aspirin 81 MG EC tablet Take 81 mg by mouth daily.        . fluocinonide cream (LIDEX) 0.05 % Apply topically 2 (two) times daily.  60 g  1  . ibuprofen (ADVIL,MOTRIN) 200 MG tablet 2-3 by mouth once daily as needed for back pain       . lovastatin (MEVACOR) 20 MG tablet Take 1 tablet (20 mg total) by mouth daily.  90 tablet  3  . meclizine (ANTIVERT) 12.5 MG tablet 1-2 tabs by mouth every 8 hrs as needed for dizziness  50 tablet  1  . sertraline (ZOLOFT) 100 MG tablet Take 1 tablet (100 mg total) by mouth daily.  90 tablet  3  . Tamsulosin HCl (FLOMAX) 0.4 MG CAPS Take 1 capsule (0.4 mg total) by mouth daily.  90 capsule  3  . zolpidem (AMBIEN) 10 MG tablet Take 1 tablet (10 mg total) by mouth at bedtime as needed. To fill Jan 08, 2012  90 tablet  1    Review of Systems  Constitutional: Negative for unexpected weight change, or unusual diaphoresis  HENT: Negative for tinnitus.   Eyes: Negative for photophobia and visual disturbance.  Respiratory: Negative for choking and stridor.   Gastrointestinal: Negative for vomiting and blood in stool.  Genitourinary: Negative for hematuria and decreased urine volume.  Musculoskeletal: Negative for acute joint swelling Skin: Negative for color change and wound.  Neurological: Negative for tremors and numbness other than noted  Psychiatric/Behavioral: Negative for decreased concentration or  hyperactivity.         Objective:   Physical Exam BP 108/70  Pulse 86  Temp 99.2 F (37.3 C) (Oral)  Ht 6\' 2"  (1.88 m)  Wt 245 lb (111.131 kg)  BMI 31.46 kg/m2  SpO2 93% VS noted, mild ill Constitutional: Pt appears well-developed and well-nourished.  HENT: Head: NCAT.  Right Ear: External ear normal.  Left Ear: External ear normal.  Bilat tm's with mild erythema.  Max sinus areas mild tender.  Pharynx with mild erythema, no exudate Eyes: Conjunctivae and EOM are normal. Pupils are equal, round, and reactive to light.  Neck: Normal range of motion. Neck supple.  Cardiovascular: Normal rate and regular rhythm.   Pulmonary/Chest: Effort normal and breath sounds normal.  Right lateral hip with large 6 cm area bruise/hematoma with more distal bruising nontender noted to most of the anteromed thigh as well to the knee; no compartment issue - leg o/w neurovasc intact Neurological: Pt is alert. Not confused  Skin: Skin is warm. No erythema.  Psychiatric: Pt behavior is normal. Thought content normal.     Assessment & Plan:

## 2012-06-11 NOTE — Assessment & Plan Note (Signed)
Mild to mod, for antibx course,  to f/u any worsening symptoms or concerns 

## 2012-06-11 NOTE — Assessment & Plan Note (Signed)
Mild to mod, for predpack asd, liekly related to acute infectious illness,  to f/u any worsening symptoms or concerns

## 2012-06-11 NOTE — Patient Instructions (Addendum)
Please take all new medication as prescribed Please continue all other medications as before Please have the pharmacy call with any other refills you may need. Thank you for enrolling in MyChart. Please follow the instructions below to securely access your online medical record. MyChart allows you to send messages to your doctor, view your test results, renew your prescriptions, schedule appointments, and more

## 2012-08-01 ENCOUNTER — Telehealth: Payer: Self-pay | Admitting: Internal Medicine

## 2012-08-01 NOTE — Telephone Encounter (Signed)
Patient Information:  Caller Name: [place]  Phone: [phone]  Patient: Paul Tucker, Paul Tucker  Gender: Male  DOB: September 03, 1950  Age: 62 Years  PCP: Oliver Barre (Adults only)  Office Follow Up:  Does the office need to follow up with this patient?: No  Instructions For The Office: N/A  RN Note:  Spoke with Zella Ball in office. Patient can be seen in office but with another provider. Dr. Jonny Ruiz is full.  Patient declines. He only wants to see Dr.John. Advised ED. patient declines states he does not need ED. Care advise and call back parameters reviewed. understadning expressed. patient request appt Monday , 08/04/12 08:00 for back pain with Dr. Jonny Ruiz.  Symptoms  Reason For Call & Symptoms: Patient states he has back pain issues -lower back all the way across at the belt line.  He has pain for "years". Worsening yesterday, 08/01/12. He is unable to sit /stand and get comfortable.  Pain radiates in right scrotal area.  No urinary discomfort.  Reviewed Health History In EMR: Yes  Reviewed Medications In EMR: Yes  Reviewed Allergies In EMR: Yes  Reviewed Surgeries / Procedures: No  Date of Onset of Symptoms: 07/31/2012  Treatments Tried: Advil and ice packs  Treatments Tried Worked: Yes  Guideline(s) Used:  Back Pain  Disposition Per Guideline:   Go to ED Now (or to Office with PCP Approval)  Reason For Disposition Reached:   Pain radiates into groin, scrotum  Advice Given:  Cold or Heat:  Cold Pack: For pain or swelling, use a cold pack or ice wrapped in a wet cloth. Put it on the sore area for 20 minutes. Repeat 4 times on the first day, then as needed.  Heat Pack: If pain lasts over 2 days, apply heat to the sore area. Use a heat pack, heating pad, or warm wet washcloth. Do this for 10 minutes, then as needed. For widespread stiffness, take a hot bath or hot shower instead. Move the sore area under the warm water.  Reassurance:  Twisting or heavy lifting can cause back pain.  With treatment, the  pain most often goes away in 1-2 weeks.  You can treat most back pain at home.  Here is some care advice that should help.  Sleep:  Sleep on your side with a pillow between your knees. If you sleep on your back, put a pillow under your knees.  Avoid sleeping on your stomach.  Your mattress should be firm. Avoid waterbeds.  Pain Medicines:  For pain relief, take acetaminophen, ibuprofen, or naproxen.  Use the lowest amount of medicine that makes your pain feel better.  Acetaminophen (e.g., Tylenol):  Take 650 mg (two 325 mg pills) by mouth every 4-6 hours as needed. Each Regular Strength Tylenol pill has 325 mg of acetaminophen. The most you should take each day is 3,250 mg (10 Regular Strength pills a day).  Ibuprofen (e.g., Motrin, Advil):  Take 400 mg (two 200 mg pills) by mouth every 6 hours.  Call Back If:  Numbness or weakness occur  Bowel/bladder problems occur  Pain lasts for more than 2 weeks  You become worse. Advised to increase water intake. Mointor s/sx closely. Go to ER for worsening. Understanding expressed.  Patient Refused Recommendation:  Patient Will Make Own Appointment  Appt scheduled 08/04/12 at 08:00 for back pain

## 2012-08-04 ENCOUNTER — Encounter: Payer: Self-pay | Admitting: Internal Medicine

## 2012-08-04 ENCOUNTER — Ambulatory Visit (INDEPENDENT_AMBULATORY_CARE_PROVIDER_SITE_OTHER): Payer: BC Managed Care – PPO | Admitting: Internal Medicine

## 2012-08-04 VITALS — BP 110/62 | HR 69 | Temp 97.4°F | Ht 74.0 in | Wt 247.5 lb

## 2012-08-04 DIAGNOSIS — M545 Low back pain: Secondary | ICD-10-CM

## 2012-08-04 MED ORDER — CYCLOBENZAPRINE HCL 5 MG PO TABS: 5.0000 mg | ORAL_TABLET | Freq: Three times a day (TID) | ORAL | Status: DC | PRN

## 2012-08-04 MED ORDER — TRAMADOL HCL 50 MG PO TABS
50.0000 mg | ORAL_TABLET | Freq: Four times a day (QID) | ORAL | Status: DC | PRN
Start: 2012-08-04 — End: 2013-10-16

## 2012-08-04 MED ORDER — ZOLPIDEM TARTRATE 10 MG PO TABS: 10.0000 mg | ORAL_TABLET | Freq: Every evening | ORAL | Status: DC | PRN

## 2012-08-04 MED ORDER — PREDNISONE 10 MG PO TABS: ORAL_TABLET | ORAL | Status: DC

## 2012-08-04 NOTE — Assessment & Plan Note (Signed)
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 Distal minor LLE weakness on exam, for now for pain med/predpack/flexeril and no lifting at work [employer]) for 1 wk

## 2012-08-04 NOTE — Assessment & Plan Note (Addendum)
stable overall by history and exam, recent data reviewed with pt, and pt to continue medical treatment as before,  to f/u any worsening symptoms or concerns Lab Results  Component Value Date   HGBA1C 6.7* 11/29/2011   To call for cbg > 200 with steroid tx

## 2012-08-04 NOTE — Assessment & Plan Note (Signed)
stable overall by history and exam, recent data reviewed with pt, and pt to continue medical treatment as before,  to f/u any worsening symptoms or concerns Lab Results  Component Value Date   WBC 6.3 11/29/2011   HGB 14.3 11/29/2011   HCT 41.6 11/29/2011   PLT 228.0 11/29/2011   GLUCOSE 150* 11/29/2011   CHOL 172 11/29/2011   TRIG 125.0 11/29/2011   HDL 45.40 11/29/2011   LDLCALC 102* 11/29/2011   ALT 25 11/29/2011   AST 21 11/29/2011   NA 139 11/29/2011   K 4.8 11/29/2011   CL 105 11/29/2011   CREATININE 1.0 11/29/2011   BUN 16 11/29/2011   CO2 27 11/29/2011   TSH 1.37 11/29/2011   PSA 3.54 11/29/2011   HGBA1C 6.7* 11/29/2011

## 2012-08-04 NOTE — Progress Notes (Signed)
Subjective:    Patient ID: Paul Tucker, male    DOB: 04-May-1951, 62 y.o.   MRN: [id number]  HPI here with recent onset LBP from last Thur after pushing the car in the ice, whole back across the back, maybe worse on the right, mod to severe, constant, with occas radiating to the right testice, occas left testicle.  Has some radiation to the right ant thigh, ? Of some weakness to prox legs on standing, pain not worst to stand up but the "process of standing up" seems to cause muscles to lock up. Turning over in be makes worse,as well the process of standing up  Has to stop after standing for a few seconds, then can walk without increased pain, not worse to lying down either.  Ibuprofen 800 mg prn seemed to help somewhat. No bowel or bladder change, fever, wt loss, or falls.  Denies urinary symptoms such as dysuria, frequency, urgency, flank pain, hematuria or n/v, fever, chills. Called nurse hotline and told he might possible renal stone, so needs to be seen  Daughter x 2 with renal stones.    Needs ambien prn refill, still with difficulty with getting to sleep most nights  Past Medical History  Diagnosis Date  . Dermatophytosis of groin and perianal area 05/31/2008  . THRUSH 04/04/2007  . HYPOGONADISM 06/13/2009  . GLUCOSE INTOLERANCE 04/04/2007  . HYPERLIPIDEMIA 01/20/2007  . DEPRESSION 01/17/2007  . ADD 01/17/2007  . OBSTRUCTIVE SLEEP APNEA 04/04/2007  . OTITIS MEDIA, ACUTE, BILATERAL 06/13/2009  . SINUSITIS- ACUTE-NOS 04/04/2007  . GERD 01/20/2007  . DIVERTICULOSIS, COLON 04/06/2007  . HEMATOCHEZIA 07/26/2008  . Gross hematuria 01/27/2009  . Acute prostatitis 04/04/2007  . 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 LOW BACK PAIN 01/20/2007  . VERTIGO 06/13/2009  . INSOMNIA 04/04/2007  . SWEATING 09/30/2007  . RASH-NONVESICULAR 08/26/2009  . Flushing 08/26/2009  . Dysuria 01/27/2009  . URINARY RETENTION 01/27/2009  . FLANK PAIN, LEFT 09/30/2007  . PSA, INCREASED 11/18/2009  . LIBIDO, DECREASED 12/02/2008  .  PANCREATITIS, HX OF 01/17/2007  . TONSILLECTOMY AND ADENOIDECTOMY, HX OF 01/17/2007  . URI 07/25/2010  . Impaired glucose tolerance 12/10/2010   Past Surgical History  Procedure Laterality Date  . Cholecystectomy  2005  . Vasectomy  1987  . Hernia repair  1957  . Tonsillectomy      reports that he has never smoked. He does not have any smokeless tobacco history on file. He reports that  drinks alcohol. He reports that he does not use illicit drugs. family history includes Coronary artery disease in his other; Diabetes in his other; Emphysema in his father; and Heart disease in his father. Allergies  Allergen Reactions  . Pravastatin Sodium     REACTION: nausea   Current Outpatient Prescriptions on File Prior to Visit  Medication Sig Dispense Refill  . aspirin 81 MG EC tablet Take 81 mg by mouth daily.        Marland Kitchen ibuprofen (ADVIL,MOTRIN) 200 MG tablet 2-3 by mouth once daily as needed for back pain       . lovastatin (MEVACOR) 20 MG tablet Take 1 tablet (20 mg total) by mouth daily.  90 tablet  3  . meclizine (ANTIVERT) 12.5 MG tablet 1-2 tabs by mouth every 8 hrs as needed for dizziness  50 tablet  1  . predniSONE (DELTASONE) 10 MG tablet 1 tab by mouth per day for 5 days  5 tablet  1  . sertraline (ZOLOFT) 100 MG tablet Take  1 tablet (100 mg total) by mouth daily.  90 tablet  3  . Tamsulosin HCl (FLOMAX) 0.4 MG CAPS Take 1 capsule (0.4 mg total) by mouth daily.  90 capsule  3  . levofloxacin (LEVAQUIN) 250 MG tablet Take 1 tablet (250 mg total) by mouth daily.  10 tablet  0   No current facility-administered medications on file prior to visit.   Review of Systems  Constitutional: Negative for unexpected weight change, or unusual diaphoresis  HENT: Negative for tinnitus.   Eyes: Negative for photophobia and visual disturbance.  Respiratory: Negative for choking and stridor.   Gastrointestinal: Negative for vomiting and blood in stool.  Genitourinary: Negative for hematuria and  decreased urine volume.  Musculoskeletal: Negative for acute joint swelling Skin: Negative for color change and wound.  Neurological: Negative for tremors and numbness other than noted  Psychiatric/Behavioral: Negative for decreased concentration or  hyperactivity.       Objective:   Physical Exam BP 110/62  Pulse 69  Temp(Src) 97.4 F (36.3 C) (Oral)  Ht 6\' 2"  (1.88 m)  Wt 247 lb 8 oz (112.265 kg)  BMI 31.76 kg/m2  SpO2 97% VS noted,  Constitutional: Pt appears well-developed and well-nourished.  HENT: Head: NCAT.  Right Ear: External ear normal.  Left Ear: External ear normal.  Eyes: Conjunctivae and EOM are normal. Pupils are equal, round, and reactive to light.  Neck: Normal range of motion. Neck supple.  Cardiovascular: Normal rate and regular rhythm.   Pulmonary/Chest: Effort normal and breath sounds normal.  Abd:  Soft, NT, non-distended, + BS Neurological: Pt is alert. Not confused , spine nontender, but tender right buttock over right sciatic, motor 5/5 except 4+/5 distal LLE Skin: Skin is warm. No erythema.  Psychiatric: Pt behavior is normal. Thought content normal.     Assessment & Plan:

## 2012-08-04 NOTE — Patient Instructions (Signed)
Please take all new medication as prescribed Please continue all other medications as before, and refills have been done if requested.  

## 2012-10-14 ENCOUNTER — Other Ambulatory Visit: Payer: Self-pay

## 2012-10-14 MED ORDER — LOVASTATIN 20 MG PO TABS: 20.0000 mg | ORAL_TABLET | Freq: Every day | ORAL | Status: DC

## 2012-10-14 MED ORDER — SERTRALINE HCL 100 MG PO TABS: 100.0000 mg | ORAL_TABLET | Freq: Every day | ORAL | Status: DC

## 2012-10-20 ENCOUNTER — Encounter: Payer: Self-pay | Admitting: Gastroenterology

## 2012-11-03 ENCOUNTER — Telehealth: Payer: Self-pay | Admitting: Internal Medicine

## 2012-11-03 MED ORDER — LOVASTATIN 20 MG PO TABS: 20.0000 mg | ORAL_TABLET | Freq: Every day | ORAL | Status: DC

## 2012-11-03 MED ORDER — SERTRALINE HCL 100 MG PO TABS: 100.0000 mg | ORAL_TABLET | Freq: Every day | ORAL | Status: DC

## 2012-11-03 NOTE — Telephone Encounter (Signed)
Patient informed. 

## 2012-11-03 NOTE — Telephone Encounter (Signed)
Pt called stated that Lovastatin and Sertraline need to be send to [facility]. Pt stated that he never use [facility]. [facility] from his [facility]. Please check on it because pt is out of these meds.

## 2012-11-12 ENCOUNTER — Encounter: Payer: Self-pay | Admitting: Gastroenterology

## 2012-11-12 ENCOUNTER — Ambulatory Visit (INDEPENDENT_AMBULATORY_CARE_PROVIDER_SITE_OTHER): Payer: BC Managed Care – PPO | Admitting: Gastroenterology

## 2012-11-12 VITALS — BP 126/72 | HR 80 | Ht 74.0 in | Wt 253.0 lb

## 2012-11-12 DIAGNOSIS — R140 Abdominal distension (gaseous): Secondary | ICD-10-CM

## 2012-11-12 DIAGNOSIS — K3189 Other diseases of stomach and duodenum: Secondary | ICD-10-CM

## 2012-11-12 DIAGNOSIS — R159 Full incontinence of feces: Secondary | ICD-10-CM

## 2012-11-12 DIAGNOSIS — R141 Gas pain: Secondary | ICD-10-CM

## 2012-11-12 DIAGNOSIS — R1013 Epigastric pain: Secondary | ICD-10-CM | POA: Insufficient documentation

## 2012-11-12 MED ORDER — FAMOTIDINE 40 MG PO TABS: 40.0000 mg | ORAL_TABLET | Freq: Every day | ORAL | Status: DC

## 2012-11-12 MED ORDER — HYDROCORTISONE ACETATE 25 MG RE SUPP: 25.0000 mg | Freq: Two times a day (BID) | RECTAL | Status: DC

## 2012-11-12 NOTE — Assessment & Plan Note (Signed)
Symptoms of difficulty with hygiene and staining her underclothes is suggestive of hemorrhoidal disease.  Recommendations #1 Anusol-HC suppositories #2 sigmoidoscopy #3 to consider band ligation

## 2012-11-12 NOTE — Patient Instructions (Addendum)
You have been scheduled for a gastric emptying scan at [facility] on 11/24/2012 at 1pm. Please arrive at least 15 minutes prior to your appointment for registration. Please make certain not to have anything to eat or drink after midnight the night before your test. Hold all stomach medications (ex: Zofran, phenergan, Reglan) 48 hours prior to your test. If you need to reschedule your appointment, please contact radiology scheduling at [phone]. _____________________________________________________________________ A gastric-emptying study measures how Geyer it takes for food to move through your stomach. There are several ways to measure stomach emptying. In the most common test, you eat food that contains a small amount of radioactive material. A scanner that detects the movement of the radioactive material is placed over your abdomen to monitor the rate at which food leaves your stomach. This test normally takes about 2 hours to complete. _____________________________________________________________________  Bonita Quin have been scheduled for a flexible sigmoidoscopy. Please follow the written instructions given to you at your visit today. If you use inhalers (even only as needed), please bring them with you on the day of your procedure.

## 2012-11-12 NOTE — Assessment & Plan Note (Signed)
Postprandial nausea and bloating may be related to gastroparesis. Ulcer nonulcer dyspepsia are other considerations.  Recommendations #1 trial of Pepcid 20 mg daily #2 gastric emptying scan

## 2012-11-12 NOTE — Progress Notes (Signed)
History of Present Illness: Pleasant 62 year old white male referred for evaluation of dyspepsia and fecal incontinence. He is complaining of postprandial nausea and bloating. This may occur within half an hour of eating. This leaves him feeling somewhat sickly. He denies pyrosis or abdominal pain. Patient has history of hyperglycemia with an elevated hemoglobin A1c. In addition, he complains of some fecal soilage. He has difficulty cleaning himself  following a bowel movement. He has seen minimal amounts of blood in the toilet tissue at times. Colonoscopy in 2005 demonstrated diverticulosis.    Past Medical History  Diagnosis Date  . Dermatophytosis of groin and perianal area 05/31/2008  . THRUSH 04/04/2007  . HYPOGONADISM 06/13/2009  . GLUCOSE INTOLERANCE 04/04/2007  . HYPERLIPIDEMIA 01/20/2007  . DEPRESSION 01/17/2007  . ADD 01/17/2007  . OBSTRUCTIVE SLEEP APNEA 04/04/2007  . OTITIS MEDIA, ACUTE, BILATERAL 06/13/2009  . SINUSITIS- ACUTE-NOS 04/04/2007  . GERD 01/20/2007  . DIVERTICULOSIS, COLON 04/06/2007  . HEMATOCHEZIA 07/26/2008  . Gross hematuria 01/27/2009  . Acute prostatitis 04/04/2007  . 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 LOW BACK PAIN 01/20/2007  . VERTIGO 06/13/2009  . INSOMNIA 04/04/2007  . SWEATING 09/30/2007  . RASH-NONVESICULAR 08/26/2009  . Flushing 08/26/2009  . Dysuria 01/27/2009  . URINARY RETENTION 01/27/2009  . FLANK PAIN, LEFT 09/30/2007  . PSA, INCREASED 11/18/2009  . LIBIDO, DECREASED 12/02/2008  . PANCREATITIS, HX OF 01/17/2007  . TONSILLECTOMY AND ADENOIDECTOMY, HX OF 01/17/2007  . URI 07/25/2010  . Impaired glucose tolerance 12/10/2010   Past Surgical History  Procedure Laterality Date  . Cholecystectomy  2005  . Vasectomy  1987  . Hernia repair  1957  . Tonsillectomy     family history includes Coronary artery disease in his other; Diabetes in his other; Emphysema in his father; and Heart disease in his father. Current Outpatient Prescriptions  Medication Sig Dispense  Refill  . aspirin 81 MG EC tablet Take 81 mg by mouth daily.        . cyclobenzaprine (FLEXERIL) 5 MG tablet Take 1 tablet (5 mg total) by mouth 3 (three) times daily as needed for muscle spasms.  60 tablet  1  . ibuprofen (ADVIL,MOTRIN) 200 MG tablet 2-3 by mouth once daily as needed for back pain       . levofloxacin (LEVAQUIN) 250 MG tablet Take 1 tablet (250 mg total) by mouth daily.  10 tablet  0  . lovastatin (MEVACOR) 20 MG tablet Take 1 tablet (20 mg total) by mouth daily.  90 tablet  3  . meclizine (ANTIVERT) 12.5 MG tablet 1-2 tabs by mouth every 8 hrs as needed for dizziness  50 tablet  1  . predniSONE (DELTASONE) 10 MG tablet 1 tab by mouth per day for 5 days  5 tablet  1  . predniSONE (DELTASONE) 10 MG tablet 3 tabs by mouth per day for 3 days,2tabs per day for 3 days,1tab per day for 3 days  18 tablet  0  . sertraline (ZOLOFT) 100 MG tablet Take 1 tablet (100 mg total) by mouth daily.  90 tablet  3  . Tamsulosin HCl (FLOMAX) 0.4 MG CAPS Take 1 capsule (0.4 mg total) by mouth daily.  90 capsule  3  . traMADol (ULTRAM) 50 MG tablet Take 1 tablet (50 mg total) by mouth every 6 (six) hours as needed for pain.  60 tablet  1  . zolpidem (AMBIEN) 10 MG tablet Take 1 tablet (10 mg total) by mouth at bedtime as needed. To fill Jan 08, 2012  90 tablet  1   No current facility-administered medications for this visit.   Allergies as of 11/12/2012 - Review Complete 11/12/2012  Allergen Reaction Noted  . Pravastatin sodium  04/04/2007    reports that he has never smoked. He has never used smokeless tobacco. He reports that he does not drink alcohol or use illicit drugs.     Review of Systems: Pertinent positive and negative review of systems were noted in the above HPI section. All other review of systems were otherwise negative.  Vital signs were reviewed in today's medical record Physical Exam: General: Well developed , well nourished, no acute distress Skin: anicteric Head:  Normocephalic and atraumatic Eyes:  sclerae anicteric, EOMI Ears: Normal auditory acuity Mouth: No deformity or lesions Neck: Supple, no masses or thyromegaly Lungs: Clear throughout to auscultation Heart: Regular rate and rhythm; no murmurs, rubs or bruits Abdomen: Soft, non tender and non distended. No masses, hepatosplenomegaly or hernias noted. Normal Bowel sounds Rectal: There are no rectal masses. Sphincter tone is normal. Stools Hemoccult negative Musculoskeletal: Symmetrical with no gross deformities  Skin: No lesions on visible extremities Pulses:  Normal pulses noted Extremities: No clubbing, cyanosis, edema or deformities noted Neurological: Alert oriented x 4, grossly nonfocal Cervical Nodes:  No significant cervical adenopathy Inguinal Nodes: No significant inguinal adenopathy Psychological:  Alert and cooperative. Normal mood and affect

## 2012-11-13 ENCOUNTER — Encounter: Payer: Self-pay | Admitting: Gastroenterology

## 2012-11-13 ENCOUNTER — Ambulatory Visit (AMBULATORY_SURGERY_CENTER): Payer: BC Managed Care – PPO | Admitting: Gastroenterology

## 2012-11-13 VITALS — BP 131/89 | HR 66 | Temp 97.2°F | Resp 22 | Ht 74.0 in | Wt 253.0 lb

## 2012-11-13 DIAGNOSIS — K5730 Diverticulosis of large intestine without perforation or abscess without bleeding: Secondary | ICD-10-CM

## 2012-11-13 DIAGNOSIS — R142 Eructation: Secondary | ICD-10-CM

## 2012-11-13 DIAGNOSIS — K648 Other hemorrhoids: Secondary | ICD-10-CM

## 2012-11-13 DIAGNOSIS — R143 Flatulence: Secondary | ICD-10-CM

## 2012-11-13 MED ORDER — SODIUM CHLORIDE 0.9 % IV SOLN: 500.0000 mL | INTRAVENOUS | Status: DC

## 2012-11-13 NOTE — Progress Notes (Signed)
Patient did not have preoperative order for IV antibiotic SSI prophylaxis. (G8918)  Patient did not experience any of the following events: a burn prior to discharge; a fall within the facility; wrong site/side/patient/procedure/implant event; or a hospital transfer or hospital admission upon discharge from the facility. (G8907)  

## 2012-11-13 NOTE — Patient Instructions (Addendum)
YOU HAD AN ENDOSCOPIC PROCEDURE TODAY AT THE [facility]: Refer to the procedure report that was given to you for any specific questions about what was found during the examination.  If the procedure report does not answer your questions, please call your gastroenterologist to clarify.  If you requested that your care partner not be given the details of your procedure findings, then the procedure report has been included in a sealed envelope for you to review at your convenience later.  YOU SHOULD EXPECT: Some feelings of bloating in the abdomen. Passage of more gas than usual.  Walking can help get rid of the air that was put into your GI tract during the procedure and reduce the bloating. If you had a lower endoscopy (such as a colonoscopy or flexible sigmoidoscopy) you may notice spotting of blood in your stool or on the toilet paper. If you underwent a bowel prep for your procedure, then you may not have a normal bowel movement for a few days.  DIET: Your first meal following the procedure should be a light meal and then it is ok to progress to your normal diet.  A half-sandwich or bowl of soup is an example of a good first meal.  Heavy or fried foods are harder to digest and may make you feel nauseous or bloated.  Likewise meals heavy in dairy and vegetables can cause extra gas to form and this can also increase the bloating.  Drink plenty of fluids but you should avoid alcoholic beverages for 24 hours.  ACTIVITY: Your care partner should take you home directly after the procedure.  You should plan to take it easy, moving slowly for the rest of the day.  You can resume normal activity the day after the procedure however you should NOT DRIVE or use heavy machinery for 24 hours (because of the sedation medicines used during the test).    SYMPTOMS TO REPORT IMMEDIATELY: A gastroenterologist can be reached at any hour.  During normal business hours, 8:30 AM to 5:00 PM Monday through Friday,  call [phone].  After hours and on weekends, please call the GI answering service at [phone] who will take a message and have the physician on call contact you.   Following lower endoscopy (colonoscopy or flexible sigmoidoscopy):  Excessive amounts of blood in the stool  Significant tenderness or worsening of abdominal pains  Swelling of the abdomen that is new, acute  Fever of 100F or higher   FOLLOW UP: Our staff will call the home number listed on your records the next business day following your procedure to check on you and address any questions or concerns that you may have at that time regarding the information given to you following your procedure. This is a courtesy call and so if there is no answer at the home number and we have not heard from you through the emergency physician on call, we will assume that you have returned to your regular daily activities without incident.  SIGNATURES/CONFIDENTIALITY: You and/or your care partner have signed paperwork which will be entered into your electronic medical record.  These signatures attest to the fact that that the information above on your After Visit Summary has been reviewed and is understood.  Full responsibility of the confidentiality of this discharge information lies with you and/or your care-partner.  Please continue your normal medications  Please read over information about high fiber diets and diverticulosis  Please use Anusol suppositories as directed per Dr.  Kaplan  Please follow Dr. Marzetta Board recommendations for fiber  Dr. Marzetta Board office will call you to set up a follow up appointment for 1 month

## 2012-11-13 NOTE — Op Note (Signed)
[facility] [address] [place], [address]   FLEXIBLE SIGMOIDOSCOPY PROCEDURE REPORT  PATIENT: Paul Tucker, Paul Tucker  MR#: [id number] BIRTHDATE: [birth year] , 62  yrs. old GENDER: Male ENDOSCOPIST: Louis Meckel, MD REFERRED BY: PROCEDURE DATE:  11/13/2012 PROCEDURE:   Sigmoidoscopy, diagnostic ASA CLASS:   Class II INDICATIONS:incontinence. MEDICATIONS: MAC sedation, administered by CRNA and propofol (Diprivan) 200mg  IV  DESCRIPTION OF PROCEDURE:   After the risks benefits and alternatives of the procedure were thoroughly explained, informed consent was obtained.  revealed no abnormalities of the rectum. The LB [id number]-[id number]  endoscope was introduced through the anus  and advanced to the descending colon , limited by No adverse events experienced.   The quality of the prep was    .  The instrument was then slowly withdrawn as the mucosa was fully examined.       1.  COLON FINDINGS: There was severe diverticulosis noted in the sigmoid colon and descending colon with associated muscular hypertrophy, luminal narrowing and colonic spasm. 2.  Internal hemorrhoids were found. Retroflexed views revealed no abnormalities.    The scope was then withdrawn from the patient and the procedure terminated.  COMPLICATIONS: There were no complications.  ENDOSCOPIC IMPRESSION: 1.   There was severe diverticulosis noted in the sigmoid colon and descending colon 2.   Internal hemorrhoids  RECOMMENDATIONS: 1.  Anusol HC suppository 2.  fiber supplementation 3.  Office visit in one month  REPEAT EXAM:   _______________________________ eSigned:  Louis Meckel, MD 11/13/2012 3:02 PM   LO:VFIEP Ellin Mayhew, MD

## 2012-11-14 ENCOUNTER — Telehealth: Payer: Self-pay | Admitting: *Deleted

## 2012-11-14 NOTE — Telephone Encounter (Signed)
  Follow up Call-  Call back number 11/13/2012  Post procedure Call Back phone  # [phone] cell  Permission to leave phone message Yes     Patient questions:  Do you have a fever, pain , or abdominal swelling? no Pain Score  0 *  Have you tolerated food without any problems? yes  Have you been able to return to your normal activities? no  Do you have any questions about your discharge instructions: Diet   no Medications  no Follow up visit  no  Do you have questions or concerns about your Care? no  Actions: * If pain score is 4 or above: No action needed, pain <4.

## 2012-11-20 ENCOUNTER — Other Ambulatory Visit: Payer: Self-pay | Admitting: Internal Medicine

## 2012-11-24 ENCOUNTER — Encounter (HOSPITAL_COMMUNITY)
Admission: RE | Admit: 2012-11-24 | Discharge: 2012-11-24 | Disposition: A | Discharge status: Home / Self Care | Payer: BC Managed Care – PPO | Source: Ambulatory Visit | Attending: Gastroenterology | Admitting: Gastroenterology

## 2012-11-24 DIAGNOSIS — R110 Nausea: Secondary | ICD-10-CM | POA: Insufficient documentation

## 2012-11-24 DIAGNOSIS — R140 Abdominal distension (gaseous): Secondary | ICD-10-CM

## 2012-11-24 DIAGNOSIS — R142 Eructation: Secondary | ICD-10-CM | POA: Insufficient documentation

## 2012-11-24 DIAGNOSIS — R141 Gas pain: Secondary | ICD-10-CM | POA: Insufficient documentation

## 2012-11-24 MED ORDER — TECHNETIUM TC 99M SULFUR COLLOID
2.1000 | Freq: Once | INTRAVENOUS | Status: AC | PRN
  Administered 2012-11-24: 2.1 via INTRAVENOUS

## 2012-11-25 NOTE — Progress Notes (Signed)
Quick Note:  Please inform the patient that GES was normal and to continue current plan of action. Needs f/u OV ______

## 2012-12-10 ENCOUNTER — Encounter: Payer: BC Managed Care – PPO | Admitting: Internal Medicine

## 2012-12-11 ENCOUNTER — Telehealth: Payer: Self-pay | Admitting: *Deleted

## 2012-12-11 DIAGNOSIS — R972 Elevated prostate specific antigen [PSA]: Secondary | ICD-10-CM

## 2012-12-11 DIAGNOSIS — Z0000 Encounter for general adult medical examination without abnormal findings: Secondary | ICD-10-CM

## 2012-12-11 DIAGNOSIS — E291 Testicular hypofunction: Secondary | ICD-10-CM

## 2012-12-11 NOTE — Telephone Encounter (Signed)
Message copied by Deatra James on Thu Dec 11, 2012  2:50 PM ------      Message from: Etheleen Sia      Created: Thu Dec 11, 2012 10:19 AM      Regarding: LABS       PHYSICAL ON AUG 12 ------

## 2012-12-11 NOTE — Telephone Encounter (Signed)
Entered [facility] labs...lmb

## 2012-12-15 ENCOUNTER — Encounter: Payer: BC Managed Care – PPO | Admitting: Internal Medicine

## 2012-12-19 ENCOUNTER — Encounter: Payer: Self-pay | Admitting: Gastroenterology

## 2012-12-19 ENCOUNTER — Ambulatory Visit (INDEPENDENT_AMBULATORY_CARE_PROVIDER_SITE_OTHER): Payer: BC Managed Care – PPO | Admitting: Gastroenterology

## 2012-12-19 VITALS — BP 108/76 | HR 76 | Ht 72.0 in | Wt 250.4 lb

## 2012-12-19 DIAGNOSIS — R1013 Epigastric pain: Secondary | ICD-10-CM

## 2012-12-19 DIAGNOSIS — R159 Full incontinence of feces: Secondary | ICD-10-CM

## 2012-12-19 MED ORDER — HYDROCORTISONE ACETATE 25 MG RE SUPP: 25.0000 mg | Freq: Two times a day (BID) | RECTAL | Status: DC

## 2012-12-19 NOTE — Assessment & Plan Note (Signed)
Etiology is uncertain. Symptoms are very nonspecific and may be food related.  Gastric emptying scan was negative per  Recommendations #1 dietary discretion #2 antacids as needed  Hold further GI workup unless symptoms worsen

## 2012-12-19 NOTE — Assessment & Plan Note (Signed)
This is probably related to hemorrhoidal disease. Should symptoms recur would proceed with band ligation.

## 2012-12-19 NOTE — Patient Instructions (Addendum)
Call back as needed We will put you on the list for the hemorrhoidal banding

## 2012-12-19 NOTE — Progress Notes (Signed)
History of Present Illness:  The patient has returned following sigmoidoscopy. Exam demonstrated severe diverticulosis and internal hemorrhoids. With Anusol suppositories stool incontinence has significantly improved. He's been off medicines for approximately 3 weeks has noted some slight recurrence of seepage. Dyspepsia is better although it may occur after eating peanuts or vinegar.  Gastric emptying scan was normal.    Review of Systems: Pertinent positive and negative review of systems were noted in the above HPI section. All other review of systems were otherwise negative.    Current Medications, Allergies, Past Medical History, Past Surgical History, Family History and Social History were reviewed in [facility] electronic medical record  Vital signs were reviewed in today's medical record. Physical Exam: General: Well developed , well nourished, no acute distress

## 2012-12-24 ENCOUNTER — Other Ambulatory Visit (INDEPENDENT_AMBULATORY_CARE_PROVIDER_SITE_OTHER): Payer: BC Managed Care – PPO

## 2012-12-24 ENCOUNTER — Ambulatory Visit: Payer: BC Managed Care – PPO

## 2012-12-24 DIAGNOSIS — E291 Testicular hypofunction: Secondary | ICD-10-CM

## 2012-12-24 DIAGNOSIS — R7309 Other abnormal glucose: Secondary | ICD-10-CM

## 2012-12-24 DIAGNOSIS — Z0000 Encounter for general adult medical examination without abnormal findings: Secondary | ICD-10-CM

## 2012-12-24 DIAGNOSIS — R972 Elevated prostate specific antigen [PSA]: Secondary | ICD-10-CM

## 2012-12-24 LAB — CBC WITH DIFFERENTIAL/PLATELET
Basophils Relative: 0.3 % (ref 0.0–3.0)
Eosinophils Relative: 4.9 % (ref 0.0–5.0)
Hemoglobin: 14.3 g/dL (ref 13.0–17.0)
Lymphocytes Relative: 24.7 % (ref 12.0–46.0)
MCHC: 34.9 g/dL (ref 30.0–36.0)
MCV: 96.6 fl (ref 78.0–100.0)
Neutro Abs: 3.6 10*3/uL (ref 1.4–7.7)
Neutrophils Relative %: 63 % (ref 43.0–77.0)
RBC: 4.26 Mil/uL (ref 4.22–5.81)
WBC: 5.8 10*3/uL (ref 4.5–10.5)

## 2012-12-24 LAB — BASIC METABOLIC PANEL
CO2: 27 mEq/L (ref 19–32)
Calcium: 9.3 mg/dL (ref 8.4–10.5)
Chloride: 104 mEq/L (ref 96–112)
Creatinine, Ser: 1 mg/dL (ref 0.4–1.5)
Sodium: 137 mEq/L (ref 135–145)

## 2012-12-24 LAB — URINALYSIS, ROUTINE W REFLEX MICROSCOPIC
Hgb urine dipstick: NEGATIVE
Ketones, ur: NEGATIVE
RBC / HPF: NONE SEEN (ref 0–?)
Urine Glucose: 250
Urobilinogen, UA: 0.2 (ref 0.0–1.0)

## 2012-12-24 LAB — LIPID PANEL
LDL Cholesterol: 93 mg/dL (ref 0–99)
Total CHOL/HDL Ratio: 4
Triglycerides: 152 mg/dL — ABNORMAL HIGH (ref 0.0–149.0)

## 2012-12-24 LAB — HEPATIC FUNCTION PANEL
ALT: 27 U/L (ref 0–53)
Alkaline Phosphatase: 82 U/L (ref 39–117)
Bilirubin, Direct: 0.1 mg/dL (ref 0.0–0.3)
Total Protein: 7.1 g/dL (ref 6.0–8.3)

## 2012-12-24 LAB — HEMOGLOBIN A1C: Hgb A1c MFr Bld: 7.4 % — ABNORMAL HIGH (ref 4.6–6.5)

## 2012-12-30 ENCOUNTER — Ambulatory Visit (INDEPENDENT_AMBULATORY_CARE_PROVIDER_SITE_OTHER): Payer: BC Managed Care – PPO | Admitting: Internal Medicine

## 2012-12-30 ENCOUNTER — Encounter: Payer: Self-pay | Admitting: Internal Medicine

## 2012-12-30 VITALS — BP 122/78 | HR 79 | Temp 99.2°F | Ht 72.0 in | Wt 253.4 lb

## 2012-12-30 DIAGNOSIS — Z0000 Encounter for general adult medical examination without abnormal findings: Secondary | ICD-10-CM

## 2012-12-30 DIAGNOSIS — R7302 Impaired glucose tolerance (oral): Secondary | ICD-10-CM

## 2012-12-30 DIAGNOSIS — R7309 Other abnormal glucose: Secondary | ICD-10-CM

## 2012-12-30 DIAGNOSIS — E291 Testicular hypofunction: Secondary | ICD-10-CM

## 2012-12-30 MED ORDER — METFORMIN HCL ER 500 MG PO TB24: 500.0000 mg | ORAL_TABLET | Freq: Every day | ORAL | Status: DC

## 2012-12-30 MED ORDER — TAMSULOSIN HCL 0.4 MG PO CAPS: 0.4000 mg | ORAL_CAPSULE | Freq: Every day | ORAL | Status: DC

## 2012-12-30 MED ORDER — SERTRALINE HCL 100 MG PO TABS: 100.0000 mg | ORAL_TABLET | Freq: Every day | ORAL | Status: DC

## 2012-12-30 MED ORDER — ZOLPIDEM TARTRATE 10 MG PO TABS: 10.0000 mg | ORAL_TABLET | Freq: Every evening | ORAL | Status: DC | PRN

## 2012-12-30 MED ORDER — LOVASTATIN 20 MG PO TABS: 20.0000 mg | ORAL_TABLET | Freq: Every day | ORAL | Status: DC

## 2012-12-30 NOTE — Assessment & Plan Note (Signed)
New onset mild, for dm diet, wt loss, exercise and metformin ER 500 qd

## 2012-12-30 NOTE — Patient Instructions (Signed)
Please take all new medication as prescribed - the metformin for sugar Please continue all other medications as before, and refills have been done if requested. Please have the pharmacy call with any other refills you may need. Please continue your efforts at being more active, low cholesterol diet, and weight control. You are otherwise up to date with prevention measures today. Please keep your appointments with your specialists as you may have planned  Please remember to sign up for My Chart if you have not done so, as this will be important to you in the future with finding out test results, communicating by private email, and scheduling acute appointments online when needed.  Please return in 6 months, or sooner if needed, with Lab testing done 3-5 days before

## 2012-12-30 NOTE — Assessment & Plan Note (Signed)
Consider re-start androgel, declines for now

## 2012-12-30 NOTE — Progress Notes (Signed)
Subjective:    Patient ID: Paul Tucker, male    DOB: Jun 11, 1950, 62 y.o.   MRN: [id number]  HPI  Here for wellness and f/u;  Overall doing ok;  Pt denies CP, worsening SOB, DOE, wheezing, orthopnea, PND, worsening LE edema, palpitations, dizziness or syncope.  Pt denies neurological change such as new headache, facial or extremity weakness.  Pt denies polydipsia, polyuria, or low sugar symptoms. Pt states overall good compliance with treatment and medications, good tolerability, and has been trying to follow lower cholesterol diet.  Pt denies worsening depressive symptoms, suicidal ideation or panic. No fever, night sweats, wt loss, loss of appetite, or other constitutional symptoms.  Pt states good ability with ADL's, has low fall risk, home safety reviewed and adequate, no other significant changes in hearing or vision, and only occasionally active with exercise.  Wt up from 241 to 253 today with less exercise overall.  No new complaints Past Medical History  Diagnosis Date  . Dermatophytosis of groin and perianal area 05/31/2008  . THRUSH 04/04/2007  . HYPOGONADISM 06/13/2009  . GLUCOSE INTOLERANCE 04/04/2007  . HYPERLIPIDEMIA 01/20/2007  . DEPRESSION 01/17/2007  . ADD 01/17/2007  . OBSTRUCTIVE SLEEP APNEA 04/04/2007  . OTITIS MEDIA, ACUTE, BILATERAL 06/13/2009  . SINUSITIS- ACUTE-NOS 04/04/2007  . GERD 01/20/2007  . DIVERTICULOSIS, COLON 04/06/2007  . HEMATOCHEZIA 07/26/2008  . Gross hematuria 01/27/2009  . Acute prostatitis 04/04/2007  . 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 LOW BACK PAIN 01/20/2007  . VERTIGO 06/13/2009  . INSOMNIA 04/04/2007  . SWEATING 09/30/2007  . RASH-NONVESICULAR 08/26/2009  . Flushing 08/26/2009  . Dysuria 01/27/2009  . URINARY RETENTION 01/27/2009  . FLANK PAIN, LEFT 09/30/2007  . PSA, INCREASED 11/18/2009  . LIBIDO, DECREASED 12/02/2008  . PANCREATITIS, HX OF 01/17/2007  . TONSILLECTOMY AND ADENOIDECTOMY, HX OF 01/17/2007  . URI 07/25/2010  . Impaired glucose tolerance 12/10/2010    Past Surgical History  Procedure Laterality Date  . Cholecystectomy  2005  . Vasectomy  1987  . Hernia repair  1957  . Tonsillectomy      reports that he has never smoked. He has never used smokeless tobacco. He reports that he does not drink alcohol or use illicit drugs. family history includes Coronary artery disease in his other; Diabetes in his other; Emphysema in his father; and Heart disease in his father.  There is no history of Colon cancer, and Esophageal cancer, and Rectal cancer, and Stomach cancer, . Allergies  Allergen Reactions  . Pravastatin Sodium     REACTION: nausea  Pt does not recall this reaction   Current Outpatient Prescriptions on File Prior to Visit  Medication Sig Dispense Refill  . aspirin 81 MG EC tablet Take 81 mg by mouth daily.        . Ciclopirox 0.77 % gel APPLY TWICE A DAY  45 g  0  . cyclobenzaprine (FLEXERIL) 5 MG tablet Take 1 tablet (5 mg total) by mouth 3 (three) times daily as needed for muscle spasms.  60 tablet  1  . famotidine (PEPCID) 40 MG tablet Take 1 tablet (40 mg total) by mouth daily.  30 tablet  1  . hydrocortisone (ANUSOL-HC) 25 MG suppository Place 1 suppository (25 mg total) rectally every 12 (twelve) hours.  12 suppository  1  . ibuprofen (ADVIL,MOTRIN) 200 MG tablet 2-3 by mouth once daily as needed for back pain       . meclizine (ANTIVERT) 12.5 MG tablet 1-2 tabs by mouth every 8  hrs as needed for dizziness  50 tablet  1  . traMADol (ULTRAM) 50 MG tablet Take 1 tablet (50 mg total) by mouth every 6 (six) hours as needed for pain.  60 tablet  1   No current facility-administered medications on file prior to visit.    Review of Systems Constitutional: Negative for diaphoresis, activity change, appetite change or unexpected weight change.  HENT: Negative for hearing loss, ear pain, facial swelling, mouth sores and neck stiffness.   Eyes: Negative for pain, redness and visual disturbance.  Respiratory: Negative for shortness  of breath and wheezing.   Cardiovascular: Negative for chest pain and palpitations.  Gastrointestinal: Negative for diarrhea, blood in stool, abdominal distention or other pain Genitourinary: Negative for hematuria, flank pain or change in urine volume.  Musculoskeletal: Negative for myalgias and joint swelling.  Skin: Negative for color change and wound.  Neurological: Negative for syncope and numbness. other than noted Hematological: Negative for adenopathy.  Psychiatric/Behavioral: Negative for hallucinations, self-injury, decreased concentration and agitation.      Objective:   Physical Exam BP 122/78  Pulse 79  Temp(Src) 99.2 F (37.3 C) (Oral)  Ht 6' (1.829 m)  Wt 253 lb 6 oz (114.93 kg)  BMI 34.36 kg/m2  SpO2 96% VS noted,  Constitutional: Pt is oriented to person, place, and time. Appears well-developed and well-nourished.  Head: Normocephalic and atraumatic.  Right Ear: External ear normal.  Left Ear: External ear normal.  Nose: Nose normal.  Mouth/Throat: Oropharynx is clear and moist.  Eyes: Conjunctivae and EOM are normal. Pupils are equal, round, and reactive to light.  Neck: Normal range of motion. Neck supple. No JVD present. No tracheal deviation present.  Cardiovascular: Normal rate, regular rhythm, normal heart sounds and intact distal pulses.   Pulmonary/Chest: Effort normal and breath sounds normal.  Abdominal: Soft. Bowel sounds are normal. There is no tenderness. No HSM  Musculoskeletal: Normal range of motion. Exhibits no edema.  Lymphadenopathy:  Has no cervical adenopathy.  Neurological: Pt is alert and oriented to person, place, and time. Pt has normal reflexes. No cranial nerve deficit.  Skin: Skin is warm and dry. No rash noted.  Psychiatric:  Has  normal mood and affect. Behavior is normal.       Assessment & Plan:

## 2012-12-30 NOTE — Assessment & Plan Note (Addendum)

## 2013-01-13 ENCOUNTER — Encounter: Payer: Self-pay | Admitting: Gastroenterology

## 2013-02-03 ENCOUNTER — Telehealth: Payer: Self-pay | Admitting: *Deleted

## 2013-02-03 NOTE — Telephone Encounter (Signed)
Patient states he cant do it on Oct 2nd but wants [place] to leave him on the list

## 2013-02-03 NOTE — Telephone Encounter (Signed)
Leave on the list

## 2013-02-03 NOTE — Telephone Encounter (Signed)
Lm for pt

## 2013-03-26 ENCOUNTER — Other Ambulatory Visit: Payer: Self-pay

## 2013-04-10 ENCOUNTER — Encounter: Payer: Self-pay | Admitting: Gastroenterology

## 2013-04-28 ENCOUNTER — Other Ambulatory Visit: Payer: Self-pay | Admitting: Internal Medicine

## 2013-04-28 NOTE — Telephone Encounter (Signed)
Done both per erx

## 2013-05-21 HISTORY — PX: HEMORRHOID BANDING: SHX5850

## 2013-05-27 ENCOUNTER — Encounter: Payer: BC Managed Care – PPO | Admitting: Gastroenterology

## 2013-06-17 ENCOUNTER — Other Ambulatory Visit: Payer: Self-pay

## 2013-06-17 MED ORDER — ZOLPIDEM TARTRATE 10 MG PO TABS: 10.0000 mg | ORAL_TABLET | Freq: Every evening | ORAL | Status: DC | PRN

## 2013-06-17 NOTE — Telephone Encounter (Signed)
Faxed hardcopy to [facility] 

## 2013-06-17 NOTE — Telephone Encounter (Signed)
Done hardcopy to robin  

## 2013-06-22 ENCOUNTER — Encounter: Payer: Self-pay | Admitting: Gastroenterology

## 2013-06-22 ENCOUNTER — Ambulatory Visit (INDEPENDENT_AMBULATORY_CARE_PROVIDER_SITE_OTHER): Payer: BC Managed Care – PPO | Admitting: Gastroenterology

## 2013-06-22 VITALS — BP 124/80 | HR 76 | Ht 72.0 in | Wt 244.6 lb

## 2013-06-22 DIAGNOSIS — K648 Other hemorrhoids: Secondary | ICD-10-CM

## 2013-06-22 NOTE — Patient Instructions (Signed)
HEMORRHOID BANDING PROCEDURE    FOLLOW-UP CARE   1. The procedure you have had should have been relatively painless since the banding of the area involved does not have nerve endings and there is no pain sensation.  The rubber band cuts off the blood supply to the hemorrhoid and the band may fall off as soon as 48 hours after the banding (the band may occasionally be seen in the toilet bowl following a bowel movement). You may notice a temporary feeling of fullness in the rectum which should respond adequately to plain Tylenol or Motrin.  2. Following the banding, avoid strenuous exercise that evening and resume full activity the next day.  A sitz bath (soaking in a warm tub) or bidet is soothing, and can be useful for cleansing the area after bowel movements.     3. To avoid constipation, take two tablespoons of natural wheat bran, natural oat bran, flax, Benefiber or any over the counter fiber supplement and increase your water intake to 7-8 glasses daily.    4. Unless you have been prescribed anorectal medication, do not put anything inside your rectum for two weeks: No suppositories, enemas, fingers, etc.  5. Occasionally, you may have more bleeding than usual after the banding procedure.  This is often from the untreated hemorrhoids rather than the treated one.  Don't be concerned if there is a tablespoon or so of blood.  If there is more blood than this, lie flat with your bottom higher than your head and apply an ice pack to the area. If the bleeding does not stop within a half an hour or if you feel faint, call our office at [phone] or go to the emergency room.  6. Problems are not common; however, if there is a substantial amount of bleeding, severe pain, chills, fever or difficulty passing urine (very rare) or other problems, you should call us at [phone] or report to the nearest emergency room.  7. Do not stay seated continuously for more than 2-3 hours for a day or two  after the procedure.  Tighten your buttock muscles 10-15 times every two hours and take 10-15 deep breaths every 1-2 hours.  Do not spend more than a few minutes on the toilet if you cannot empty your bowel; instead re-visit the toilet at a later time   Your next scheduled banding is scheduled on 07/23/2013 at 9am

## 2013-06-22 NOTE — Progress Notes (Signed)
PROCEDURE NOTE: The patient presents with symptomatic grade *2**  hemorrhoids, requesting rubber band ligation of his/her hemorrhoidal disease.  All risks, benefits and alternative forms of therapy were described and informed consent was obtained.   The anorectum was pre-medicated with lubricant and nitroglycerine ointment The decision was made to band the *left lateral** internal hemorrhoid, and the [facility] System was used to perform band ligation without complication.  Digital anorectal examination was then performed to assure proper positioning of the band, and to adjust the banded tissue as required.  The patient was discharged home without pain or other issues.  Dietary and behavioral recommendations were given and along with follow-up instructions.    The patient will return in *2** for  follow-up and possible additional banding as required. No complications were encountered and the patient tolerated the procedure well.   

## 2013-07-03 ENCOUNTER — Ambulatory Visit: Payer: BC Managed Care – PPO | Admitting: Internal Medicine

## 2013-07-16 ENCOUNTER — Ambulatory Visit: Payer: BC Managed Care – PPO | Admitting: Internal Medicine

## 2013-07-23 ENCOUNTER — Ambulatory Visit (INDEPENDENT_AMBULATORY_CARE_PROVIDER_SITE_OTHER): Payer: BC Managed Care – PPO | Admitting: Gastroenterology

## 2013-07-23 ENCOUNTER — Other Ambulatory Visit (INDEPENDENT_AMBULATORY_CARE_PROVIDER_SITE_OTHER): Payer: BC Managed Care – PPO

## 2013-07-23 VITALS — BP 120/60 | HR 70 | Ht 74.0 in | Wt 245.2 lb

## 2013-07-23 DIAGNOSIS — K648 Other hemorrhoids: Secondary | ICD-10-CM

## 2013-07-23 DIAGNOSIS — R202 Paresthesia of skin: Secondary | ICD-10-CM

## 2013-07-23 DIAGNOSIS — R7309 Other abnormal glucose: Secondary | ICD-10-CM

## 2013-07-23 DIAGNOSIS — R7302 Impaired glucose tolerance (oral): Secondary | ICD-10-CM

## 2013-07-23 DIAGNOSIS — R209 Unspecified disturbances of skin sensation: Secondary | ICD-10-CM

## 2013-07-23 LAB — HEPATIC FUNCTION PANEL
ALBUMIN: 4.3 g/dL (ref 3.5–5.2)
ALK PHOS: 63 U/L (ref 39–117)
ALT: 29 U/L (ref 0–53)
AST: 25 U/L (ref 0–37)
Bilirubin, Direct: 0.2 mg/dL (ref 0.0–0.3)
Total Bilirubin: 1.2 mg/dL (ref 0.3–1.2)
Total Protein: 7.4 g/dL (ref 6.0–8.3)

## 2013-07-23 LAB — LIPID PANEL
Cholesterol: 154 mg/dL (ref 0–200)
HDL: 47.7 mg/dL (ref >39.00)
LDL Cholesterol: 86 mg/dL (ref 0–99)
Total CHOL/HDL Ratio: 3
Triglycerides: 100 mg/dL (ref 0.0–149.0)
VLDL: 20 mg/dL (ref 0.0–40.0)

## 2013-07-23 LAB — BASIC METABOLIC PANEL
BUN: 14 mg/dL (ref 6–23)
CO2: 26 meq/L (ref 19–32)
Calcium: 9.3 mg/dL (ref 8.4–10.5)
Chloride: 105 mEq/L (ref 96–112)
Creatinine, Ser: 1 mg/dL (ref 0.4–1.5)
GFR: 82.42 mL/min (ref >60.00)
GLUCOSE: 154 mg/dL — AB (ref 70–99)
POTASSIUM: 4.2 meq/L (ref 3.5–5.1)
SODIUM: 138 meq/L (ref 135–145)

## 2013-07-23 LAB — HEMOGLOBIN A1C: HEMOGLOBIN A1C: 5.8 % (ref 4.6–6.5)

## 2013-07-23 LAB — VITAMIN B12: Vitamin B-12: 198 pg/mL — ABNORMAL LOW (ref 211–911)

## 2013-07-23 NOTE — Patient Instructions (Signed)
HEMORRHOID BANDING PROCEDURE    FOLLOW-UP CARE   1. The procedure you have had should have been relatively painless since the banding of the area involved does not have nerve endings and there is no pain sensation.  The rubber band cuts off the blood supply to the hemorrhoid and the band may fall off as soon as 48 hours after the banding (the band may occasionally be seen in the toilet bowl following a bowel movement). You may notice a temporary feeling of fullness in the rectum which should respond adequately to plain Tylenol or Motrin.  2. Following the banding, avoid strenuous exercise that evening and resume full activity the next day.  A sitz bath (soaking in a warm tub) or bidet is soothing, and can be useful for cleansing the area after bowel movements.     3. To avoid constipation, take two tablespoons of natural wheat bran, natural oat bran, flax, Benefiber or any over the counter fiber supplement and increase your water intake to 7-8 glasses daily.    4. Unless you have been prescribed anorectal medication, do not put anything inside your rectum for two weeks: No suppositories, enemas, fingers, etc.  5. Occasionally, you may have more bleeding than usual after the banding procedure.  This is often from the untreated hemorrhoids rather than the treated one.  Don't be concerned if there is a tablespoon or so of blood.  If there is more blood than this, lie flat with your bottom higher than your head and apply an ice pack to the area. If the bleeding does not stop within a half an hour or if you feel faint, call our office at [phone] or go to the emergency room.  6. Problems are not common; however, if there is a substantial amount of bleeding, severe pain, chills, fever or difficulty passing urine (very rare) or other problems, you should call us at [phone] or report to the nearest emergency room.  7. Do not stay seated continuously for more than 2-3 hours for a day or two  after the procedure.  Tighten your buttock muscles 10-15 times every two hours and take 10-15 deep breaths every 1-2 hours.  Do not spend more than a few minutes on the toilet if you cannot empty your bowel; instead re-visit the toilet at a later time.     Your 3rd banding is scheduled on 08/31/2013 at 9:15am

## 2013-07-23 NOTE — Progress Notes (Signed)
PROCEDURE NOTE: The patient presents with symptomatic grade *2**  hemorrhoids, requesting rubber band ligation of his/her hemorrhoidal disease.  All risks, benefits and alternative forms of therapy were described and informed consent was obtained.   The anorectum was pre-medicated with lubricant and nitroglycerine ointment The decision was made to band the *right posterior** internal hemorrhoid, and the [facility] System was used to perform band ligation without complication.  Digital anorectal examination was then performed to assure proper positioning of the band, and to adjust the banded tissue as required.  The patient was discharged home without pain or other issues.  Dietary and behavioral recommendations were given and along with follow-up instructions.    The patient will return in **2* for  follow-up and possible additional banding as required. No complications were encountered and the patient tolerated the procedure well.   

## 2013-08-04 ENCOUNTER — Ambulatory Visit: Payer: BC Managed Care – PPO | Admitting: Internal Medicine

## 2013-08-14 ENCOUNTER — Encounter: Payer: Self-pay | Admitting: Internal Medicine

## 2013-08-14 ENCOUNTER — Ambulatory Visit (INDEPENDENT_AMBULATORY_CARE_PROVIDER_SITE_OTHER): Payer: BC Managed Care – PPO | Admitting: Internal Medicine

## 2013-08-14 VITALS — BP 122/72 | HR 75 | Temp 98.6°F | Ht 73.0 in | Wt 244.5 lb

## 2013-08-14 DIAGNOSIS — E1165 Type 2 diabetes mellitus with hyperglycemia: Secondary | ICD-10-CM

## 2013-08-14 DIAGNOSIS — IMO0001 Reserved for inherently not codable concepts without codable children: Secondary | ICD-10-CM

## 2013-08-14 DIAGNOSIS — Z0000 Encounter for general adult medical examination without abnormal findings: Secondary | ICD-10-CM

## 2013-08-14 DIAGNOSIS — E785 Hyperlipidemia, unspecified: Secondary | ICD-10-CM

## 2013-08-14 DIAGNOSIS — E538 Deficiency of other specified B group vitamins: Secondary | ICD-10-CM

## 2013-08-14 MED ORDER — CYANOCOBALAMIN 1000 MCG/ML IJ SOLN
1000.0000 ug | Freq: Once | INTRAMUSCULAR | Status: AC
  Administered 2013-08-14: 1000 ug via INTRAMUSCULAR

## 2013-08-14 NOTE — Assessment & Plan Note (Signed)
For b12 shot today, then oral supplement, with re-check next visit

## 2013-08-14 NOTE — Progress Notes (Signed)
Pre visit review using our clinic review tool, if applicable. No additional management support is needed unless otherwise documented below in the visit note. 

## 2013-08-14 NOTE — Assessment & Plan Note (Signed)
overcontrolled recently after lost wt, now off metformin, to cont diet

## 2013-08-14 NOTE — Progress Notes (Signed)
Subjective:    Patient ID: Paul Tucker, male    DOB: Oct 29, 1950, 63 y.o.   MRN: [id number]  HPI  Here to f/u; overall doing ok,  Pt denies chest pain, increased sob or doe, wheezing, orthopnea, PND, increased LE swelling, palpitations, dizziness or syncope.  Pt denies polydipsia, polyuria.  Pt denies new neurological symptoms such as new headache, or facial or extremity weakness or numbness.   Pt states overall good compliance with meds, has been trying to follow lower cholesterol, diabetic diet, with wt overall down over 10 lbs with better diet and activity.  Had to stop metformiin due to lower sugars and feeling poorly. Past Medical History  Diagnosis Date  . Dermatophytosis of groin and perianal area 05/31/2008  . THRUSH 04/04/2007  . HYPOGONADISM 06/13/2009  . GLUCOSE INTOLERANCE 04/04/2007  . HYPERLIPIDEMIA 01/20/2007  . DEPRESSION 01/17/2007  . ADD 01/17/2007  . OBSTRUCTIVE SLEEP APNEA 04/04/2007  . OTITIS MEDIA, ACUTE, BILATERAL 06/13/2009  . SINUSITIS- ACUTE-NOS 04/04/2007  . GERD 01/20/2007  . DIVERTICULOSIS, COLON 04/06/2007  . HEMATOCHEZIA 07/26/2008  . Gross hematuria 01/27/2009  . Acute prostatitis 04/04/2007  . 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 LOW BACK PAIN 01/20/2007  . VERTIGO 06/13/2009  . INSOMNIA 04/04/2007  . SWEATING 09/30/2007  . RASH-NONVESICULAR 08/26/2009  . Flushing 08/26/2009  . Dysuria 01/27/2009  . URINARY RETENTION 01/27/2009  . FLANK PAIN, LEFT 09/30/2007  . PSA, INCREASED 11/18/2009  . LIBIDO, DECREASED 12/02/2008  . PANCREATITIS, HX OF 01/17/2007  . TONSILLECTOMY AND ADENOIDECTOMY, HX OF 01/17/2007  . URI 07/25/2010  . Impaired glucose tolerance 12/10/2010   Past Surgical History  Procedure Laterality Date  . Cholecystectomy  2005  . Vasectomy  1987  . Hernia repair  1957  . Tonsillectomy      reports that he has never smoked. He has never used smokeless tobacco. He reports that he does not drink alcohol or use illicit drugs. family history includes Coronary  artery disease in his other; Diabetes in his other; Emphysema in his father; Heart disease in his father. There is no history of Colon cancer, Esophageal cancer, Rectal cancer, or Stomach cancer. Allergies  Allergen Reactions  . Pravastatin Sodium     REACTION: nausea  Pt does not recall this reaction   Current Outpatient Prescriptions on File Prior to Visit  Medication Sig Dispense Refill  . aspirin 81 MG EC tablet Take 81 mg by mouth daily.        . Ciclopirox 0.77 % gel APPLY TWICE A DAY  30 g  5  . cyclobenzaprine (FLEXERIL) 5 MG tablet Take 1 tablet (5 mg total) by mouth 3 (three) times daily as needed for muscle spasms.  60 tablet  1  . famotidine (PEPCID) 40 MG tablet Take 1 tablet (40 mg total) by mouth daily.  30 tablet  1  . fluocinonide cream (LIDEX) 0.05 % APPLY TWICE A DAY  60 g  0  . hydrocortisone (ANUSOL-HC) 25 MG suppository Place 1 suppository (25 mg total) rectally every 12 (twelve) hours.  12 suppository  1  . ibuprofen (ADVIL,MOTRIN) 200 MG tablet 2-3 by mouth once daily as needed for back pain       . lovastatin (MEVACOR) 20 MG tablet Take 1 tablet (20 mg total) by mouth daily.  90 tablet  3  . meclizine (ANTIVERT) 12.5 MG tablet 1-2 tabs by mouth every 8 hrs as needed for dizziness  50 tablet  1  . sertraline (ZOLOFT) 100  MG tablet Take 1 tablet (100 mg total) by mouth daily.  90 tablet  3  . tamsulosin (FLOMAX) 0.4 MG CAPS capsule Take 1 capsule (0.4 mg total) by mouth daily.  90 capsule  3  . traMADol (ULTRAM) 50 MG tablet Take 1 tablet (50 mg total) by mouth every 6 (six) hours as needed for pain.  60 tablet  1  . zolpidem (AMBIEN) 10 MG tablet Take 1 tablet (10 mg total) by mouth at bedtime as needed. To fill Jan 08, 2012  90 tablet  1   No current facility-administered medications on file prior to visit.   Review of Systems  Constitutional: Negative for unexpected weight change, or unusual diaphoresis  HENT: Negative for tinnitus.   Eyes: Negative for  photophobia and visual disturbance.  Respiratory: Negative for choking and stridor.   Gastrointestinal: Negative for vomiting and blood in stool.  Genitourinary: Negative for hematuria and decreased urine volume.  Musculoskeletal: Negative for acute joint swelling Skin: Negative for color change and wound.  Neurological: Negative for tremors and numbness other than noted  Psychiatric/Behavioral: Negative for decreased concentration or  hyperactivity.       Objective:   Physical Exam BP 122/72  Pulse 75  Temp(Src) 98.6 F (37 C) (Oral)  Ht 6\' 1"  (1.854 m)  Wt 244 lb 8 oz (110.904 kg)  BMI 32.26 kg/m2  SpO2 96% VS noted,  Constitutional: Pt appears well-developed and well-nourished.  HENT: Head: NCAT.  Right Ear: External ear normal.  Left Ear: External ear normal.  Eyes: Conjunctivae and EOM are normal. Pupils are equal, round, and reactive to light.  Neck: Normal range of motion. Neck supple.  Cardiovascular: Normal rate and regular rhythm.   Pulmonary/Chest: Effort normal and breath sounds normal.  Abd:  Soft, NT, non-distended, + BS Neurological: Pt is alert. Not confused  Skin: Skin is warm. No erythema.  Psychiatric: Pt behavior is normal. Thought content normal.     Assessment & Plan:

## 2013-08-14 NOTE — Assessment & Plan Note (Signed)
stable overall by history and exam, recent data reviewed with pt, and pt to continue medical treatment as before,  to f/u any worsening symptoms or concerns Lab Results  Component Value Date   LDLCALC 86 07/23/2013

## 2013-08-14 NOTE — Patient Instructions (Signed)
OK to stay off the metformin  You had the B12 shot today  Please also take an OTC B12 supplement (or B complex multivitamin)  - 1 per day  Please continue all other medications as before, and refills have been done if requested. Please have the pharmacy call with any other refills you may need.  Please return in 6 months, or sooner if needed, with Lab testing done 3-5 days before

## 2013-08-26 ENCOUNTER — Telehealth: Payer: Self-pay

## 2013-08-26 NOTE — Telephone Encounter (Signed)
Relevant patient education assigned to patient using Emmi. ° °

## 2013-08-31 ENCOUNTER — Encounter: Payer: Self-pay | Admitting: Gastroenterology

## 2013-08-31 ENCOUNTER — Ambulatory Visit (INDEPENDENT_AMBULATORY_CARE_PROVIDER_SITE_OTHER): Payer: BC Managed Care – PPO | Admitting: Gastroenterology

## 2013-08-31 VITALS — BP 130/78 | HR 80 | Ht 72.0 in | Wt 245.8 lb

## 2013-08-31 DIAGNOSIS — K648 Other hemorrhoids: Secondary | ICD-10-CM

## 2013-08-31 NOTE — Patient Instructions (Signed)
HEMORRHOID BANDING PROCEDURE    FOLLOW-UP CARE   1. The procedure you have had should have been relatively painless since the banding of the area involved does not have nerve endings and there is no pain sensation.  The rubber band cuts off the blood supply to the hemorrhoid and the band may fall off as soon as 48 hours after the banding (the band may occasionally be seen in the toilet bowl following a bowel movement). You may notice a temporary feeling of fullness in the rectum which should respond adequately to plain Tylenol or Motrin.  2. Following the banding, avoid strenuous exercise that evening and resume full activity the next day.  A sitz bath (soaking in a warm tub) or bidet is soothing, and can be useful for cleansing the area after bowel movements.     3. To avoid constipation, take two tablespoons of natural wheat bran, natural oat bran, flax, Benefiber or any over the counter fiber supplement and increase your water intake to 7-8 glasses daily.    4. Unless you have been prescribed anorectal medication, do not put anything inside your rectum for two weeks: No suppositories, enemas, fingers, etc.  5. Occasionally, you may have more bleeding than usual after the banding procedure.  This is often from the untreated hemorrhoids rather than the treated one.  Don't be concerned if there is a tablespoon or so of blood.  If there is more blood than this, lie flat with your bottom higher than your head and apply an ice pack to the area. If the bleeding does not stop within a half an hour or if you feel faint, call our office at [phone] or go to the emergency room.  6. Problems are not common; however, if there is a substantial amount of bleeding, severe pain, chills, fever or difficulty passing urine (very rare) or other problems, you should call us at [phone] or report to the nearest emergency room.  7. Do not stay seated continuously for more than 2-3 hours for a day or two  after the procedure.  Tighten your buttock muscles 10-15 times every two hours and take 10-15 deep breaths every 1-2 hours.  Do not spend more than a few minutes on the toilet if you cannot empty your bowel; instead re-visit the toilet at a later time.     Your follow up appointment after bandings is scheduled on 10/16/2013 at 1:45pm

## 2013-08-31 NOTE — Progress Notes (Signed)
PROCEDURE NOTE: The patient presents with symptomatic grade *2**  hemorrhoids, requesting rubber band ligation of his/her hemorrhoidal disease.  All risks, benefits and alternative forms of therapy were described and informed consent was obtained.   The anorectum was pre-medicated with lubricant and nitroglycerine ointment The decision was made to band the **right anterior* internal hemorrhoid, and the [place] was used to perform band ligation.  The first band did not adhere and a second band was placed.    Digital anorectal examination was then performed to assure proper positioning of the band, and to adjust the banded tissue as required.  I was not certain that I palpated the band.  The patient was discharged home without pain or other issues.  Dietary and behavioral recommendations were given and along with follow-up instructions.    The patient will return in *4** for  follow-up No complications were encountered and the patient tolerated the procedure well.  If patient still has symptoms of bleeding or pruritus I would consider repeating band ligation of the right anterior hemorrhoidal bundle.

## 2013-10-16 ENCOUNTER — Ambulatory Visit (INDEPENDENT_AMBULATORY_CARE_PROVIDER_SITE_OTHER): Payer: BC Managed Care – PPO | Admitting: Gastroenterology

## 2013-10-16 ENCOUNTER — Encounter: Payer: Self-pay | Admitting: Gastroenterology

## 2013-10-16 VITALS — BP 160/80 | HR 80 | Ht 72.0 in | Wt 254.2 lb

## 2013-10-16 DIAGNOSIS — K648 Other hemorrhoids: Secondary | ICD-10-CM

## 2013-10-16 NOTE — Patient Instructions (Signed)
HEMORRHOID BANDING PROCEDURE    FOLLOW-UP CARE   1. The procedure you have had should have been relatively painless since the banding of the area involved does not have nerve endings and there is no pain sensation.  The rubber band cuts off the blood supply to the hemorrhoid and the band may fall off as soon as 48 hours after the banding (the band may occasionally be seen in the toilet bowl following a bowel movement). You may notice a temporary feeling of fullness in the rectum which should respond adequately to plain Tylenol or Motrin.  2. Following the banding, avoid strenuous exercise that evening and resume full activity the next day.  A sitz bath (soaking in a warm tub) or bidet is soothing, and can be useful for cleansing the area after bowel movements.     3. To avoid constipation, take two tablespoons of natural wheat bran, natural oat bran, flax, Benefiber or any over the counter fiber supplement and increase your water intake to 7-8 glasses daily.    4. Unless you have been prescribed anorectal medication, do not put anything inside your rectum for two weeks: No suppositories, enemas, fingers, etc.  5. Occasionally, you may have more bleeding than usual after the banding procedure.  This is often from the untreated hemorrhoids rather than the treated one.  Don't be concerned if there is a tablespoon or so of blood.  If there is more blood than this, lie flat with your bottom higher than your head and apply an ice pack to the area. If the bleeding does not stop within a half an hour or if you feel faint, call our office at [phone] or go to the emergency room.  6. Problems are not common; however, if there is a substantial amount of bleeding, severe pain, chills, fever or difficulty passing urine (very rare) or other problems, you should call us at [phone] or report to the nearest emergency room.  7. Do not stay seated continuously for more than 2-3 hours for a day or two  after the procedure.  Tighten your buttock muscles 10-15 times every two hours and take 10-15 deep breaths every 1-2 hours.  Do not spend more than a few minutes on the toilet if you cannot empty your bowel; instead re-visit the toilet at a later time.   Your Follow up appointment is scheduled on 12/17/2013 at 8:45am

## 2013-10-16 NOTE — Progress Notes (Signed)
Has undergone hemorrhoidal banding x3.  Seepage of mucus is "90% improved."  He does not think that the last band took.  He denies rectal soreness or bleeding.  Endoscopy was performed and demonstrated a small hemorrhoid in the right anterior position.  PROCEDURE NOTE: The patient presents with symptomatic grade *2**  hemorrhoids, requesting rubber band ligation of his/her hemorrhoidal disease.  All risks, benefits and alternative forms of therapy were described and informed consent was obtained.   The anorectum was pre-medicated with lubricant and nitroglycerine ointment The decision was made to band the *right anterior** internal hemorrhoid, and the [place] was used to perform band ligation without complication.  Digital anorectal examination was then performed to assure proper positioning of the band, and to adjust the banded tissue as required.  The patient was discharged home without pain or other issues.  Dietary and behavioral recommendations were given and along with follow-up instructions.    The patient will return in *8** for  follow-up.  No complications were encountered and the patient tolerated the procedure well.

## 2013-10-16 NOTE — Assessment & Plan Note (Signed)
Issues significantly improved chest post banding x3 but he has residual symptoms, possibly from a persistent hemorrhoid in the right anterior position.  Repeat banding was performed.  Recommendations #1 followup in 8 weeks

## 2013-12-01 ENCOUNTER — Encounter: Payer: Self-pay | Admitting: Gastroenterology

## 2013-12-17 ENCOUNTER — Ambulatory Visit: Payer: BC Managed Care – PPO | Admitting: Gastroenterology

## 2014-01-04 ENCOUNTER — Other Ambulatory Visit: Payer: Self-pay | Admitting: Internal Medicine

## 2014-01-04 ENCOUNTER — Telehealth: Payer: Self-pay | Admitting: *Deleted

## 2014-01-04 NOTE — Telephone Encounter (Signed)
Left msg on triage have cpx 9/29, but is needing medication. Called pt back no answer [facility] can only send 30 day to local pharmacy. Pls call back to give [place] name of medications...[name]

## 2014-01-05 ENCOUNTER — Other Ambulatory Visit: Payer: Self-pay | Admitting: Internal Medicine

## 2014-01-05 MED ORDER — ZOLPIDEM TARTRATE 10 MG PO TABS: 10.0000 mg | ORAL_TABLET | Freq: Every day | ORAL | Status: DC

## 2014-01-05 NOTE — Telephone Encounter (Signed)
Faxed hardcopy to [facility].

## 2014-01-05 NOTE — Telephone Encounter (Signed)
Done hardcopy to robin  

## 2014-01-06 NOTE — Telephone Encounter (Signed)
Pt called back on yesterday 01/05/14 30 day supply was sent on his medication until cpx...Paul Tucker

## 2014-01-29 ENCOUNTER — Telehealth: Payer: Self-pay | Admitting: Internal Medicine

## 2014-01-29 MED ORDER — MECLIZINE HCL 12.5 MG PO TABS: ORAL_TABLET | ORAL | Status: DC

## 2014-01-29 NOTE — Telephone Encounter (Signed)
Patient is requesting refill on meclizine.  He would like it to go through his mail order but he does not know the name of his mail order.  He hopes that its on file.

## 2014-01-29 NOTE — Telephone Encounter (Signed)
Notified pt rx fax [facility]...Paul Tucker

## 2014-02-10 ENCOUNTER — Other Ambulatory Visit (INDEPENDENT_AMBULATORY_CARE_PROVIDER_SITE_OTHER): Payer: BC Managed Care – PPO

## 2014-02-10 DIAGNOSIS — E538 Deficiency of other specified B group vitamins: Secondary | ICD-10-CM

## 2014-02-10 DIAGNOSIS — Z0000 Encounter for general adult medical examination without abnormal findings: Secondary | ICD-10-CM

## 2014-02-10 DIAGNOSIS — IMO0001 Reserved for inherently not codable concepts without codable children: Secondary | ICD-10-CM

## 2014-02-10 DIAGNOSIS — E1165 Type 2 diabetes mellitus with hyperglycemia: Principal | ICD-10-CM

## 2014-02-10 LAB — LIPID PANEL
CHOLESTEROL: 177 mg/dL (ref 0–200)
HDL: 38.6 mg/dL — ABNORMAL LOW (ref >39.00)
LDL CALC: 101 mg/dL — AB (ref 0–99)
NonHDL: 138.4
Total CHOL/HDL Ratio: 5
Triglycerides: 188 mg/dL — ABNORMAL HIGH (ref 0.0–149.0)
VLDL: 37.6 mg/dL (ref 0.0–40.0)

## 2014-02-10 LAB — PSA: PSA: 3 ng/mL (ref 0.10–4.00)

## 2014-02-10 LAB — BASIC METABOLIC PANEL
BUN: 14 mg/dL (ref 6–23)
CO2: 28 mEq/L (ref 19–32)
Calcium: 9.4 mg/dL (ref 8.4–10.5)
Chloride: 100 mEq/L (ref 96–112)
Creatinine, Ser: 1.1 mg/dL (ref 0.4–1.5)
GFR: 75.14 mL/min (ref >60.00)
Glucose, Bld: 253 mg/dL — ABNORMAL HIGH (ref 70–99)
POTASSIUM: 4.6 meq/L (ref 3.5–5.1)
SODIUM: 136 meq/L (ref 135–145)

## 2014-02-10 LAB — HEPATIC FUNCTION PANEL
ALBUMIN: 4.5 g/dL (ref 3.5–5.2)
ALK PHOS: 82 U/L (ref 39–117)
ALT: 26 U/L (ref 0–53)
AST: 24 U/L (ref 0–37)
Bilirubin, Direct: 0.2 mg/dL (ref 0.0–0.3)
Total Bilirubin: 0.9 mg/dL (ref 0.2–1.2)
Total Protein: 7.8 g/dL (ref 6.0–8.3)

## 2014-02-10 LAB — CBC WITH DIFFERENTIAL/PLATELET
BASOS ABS: 0 10*3/uL (ref 0.0–0.1)
Basophils Relative: 0.2 % (ref 0.0–3.0)
EOS ABS: 0.3 10*3/uL (ref 0.0–0.7)
Eosinophils Relative: 5 % (ref 0.0–5.0)
HEMATOCRIT: 42.4 % (ref 39.0–52.0)
Hemoglobin: 14.7 g/dL (ref 13.0–17.0)
Lymphocytes Relative: 18.6 % (ref 12.0–46.0)
Lymphs Abs: 1.3 10*3/uL (ref 0.7–4.0)
MCHC: 34.6 g/dL (ref 30.0–36.0)
MCV: 96.6 fl (ref 78.0–100.0)
MONOS PCT: 7.1 % (ref 3.0–12.0)
Monocytes Absolute: 0.5 10*3/uL (ref 0.1–1.0)
Neutro Abs: 4.7 10*3/uL (ref 1.4–7.7)
Neutrophils Relative %: 69.1 % (ref 43.0–77.0)
PLATELETS: 236 10*3/uL (ref 150.0–400.0)
RBC: 4.39 Mil/uL (ref 4.22–5.81)
RDW: 12.9 % (ref 11.5–15.5)
WBC: 6.9 10*3/uL (ref 4.0–10.5)

## 2014-02-10 LAB — MICROALBUMIN / CREATININE URINE RATIO
CREATININE, U: 223.1 mg/dL
MICROALB UR: 6.9 mg/dL — AB (ref 0.0–1.9)
Microalb Creat Ratio: 3.1 mg/g (ref 0.0–30.0)

## 2014-02-10 LAB — URINALYSIS, ROUTINE W REFLEX MICROSCOPIC
BILIRUBIN URINE: NEGATIVE
HGB URINE DIPSTICK: NEGATIVE
Ketones, ur: NEGATIVE
LEUKOCYTES UA: NEGATIVE
Nitrite: NEGATIVE
Specific Gravity, Urine: 1.025 (ref 1.000–1.030)
Urine Glucose: 250 — AB
Urobilinogen, UA: 0.2 (ref 0.0–1.0)
pH: 6 (ref 5.0–8.0)

## 2014-02-10 LAB — VITAMIN B12: VITAMIN B 12: 347 pg/mL (ref 211–911)

## 2014-02-10 LAB — HEMOGLOBIN A1C: HEMOGLOBIN A1C: 8.1 % — AB (ref 4.6–6.5)

## 2014-02-10 LAB — TSH: TSH: 2.55 u[IU]/mL (ref 0.35–4.50)

## 2014-02-16 ENCOUNTER — Ambulatory Visit: Payer: BC Managed Care – PPO | Admitting: Internal Medicine

## 2014-02-25 ENCOUNTER — Ambulatory Visit (INDEPENDENT_AMBULATORY_CARE_PROVIDER_SITE_OTHER): Payer: BC Managed Care – PPO | Admitting: Internal Medicine

## 2014-02-25 ENCOUNTER — Encounter: Payer: Self-pay | Admitting: Internal Medicine

## 2014-02-25 VITALS — BP 112/68 | HR 79 | Temp 98.4°F | Wt 248.0 lb

## 2014-02-25 DIAGNOSIS — E119 Type 2 diabetes mellitus without complications: Secondary | ICD-10-CM

## 2014-02-25 DIAGNOSIS — Z0000 Encounter for general adult medical examination without abnormal findings: Secondary | ICD-10-CM

## 2014-02-25 DIAGNOSIS — Z23 Encounter for immunization: Secondary | ICD-10-CM

## 2014-02-25 DIAGNOSIS — K5731 Diverticulosis of large intestine without perforation or abscess with bleeding: Secondary | ICD-10-CM

## 2014-02-25 DIAGNOSIS — E785 Hyperlipidemia, unspecified: Secondary | ICD-10-CM

## 2014-02-25 MED ORDER — METFORMIN HCL ER 500 MG PO TB24: ORAL_TABLET | ORAL | Status: DC

## 2014-02-25 MED ORDER — LOVASTATIN 40 MG PO TABS: 40.0000 mg | ORAL_TABLET | Freq: Every day | ORAL | Status: DC

## 2014-02-25 NOTE — Patient Instructions (Addendum)
You had the pneumovax pneumonia shot, and the flu shot today  Please take all new medication as prescribed - the metformin at 2 pills in the AM  OK to increase the lovastatin to 40 mg per day Please continue all other medications as before, and refills have been done if requested.  Please have the pharmacy call with any other refills you may need.  Please continue your efforts at being more active, low cholesterol diet, and weight control.  You are otherwise up to date with prevention measures today.  Please keep your appointments with your specialists as you may have planned  You will be contacted regarding the referral for: colonoscopy  Your lab work was otherwise OK today  Please remember to sign up for MyChart if you have not done so, as this will be important to you in the future with finding out test results, communicating by private email, and scheduling acute appointments online when needed.  Please return in 6 months, or sooner if needed, with Lab testing done 3-5 days before

## 2014-02-25 NOTE — Progress Notes (Signed)
Subjective:    Patient ID: Paul Tucker, male    DOB: [birth year], 63 y.o.   MRN: [id number]  HPI  Here for wellness and f/u;  Overall doing ok;  Pt denies CP, worsening SOB, DOE, wheezing, orthopnea, PND, worsening LE edema, palpitations, dizziness or syncope.  Pt denies neurological change such as new headache, facial or extremity weakness.  Pt denies polydipsia, polyuria, or low sugar symptoms. Pt states overall good compliance with treatment and medications, good tolerability, and has been trying to follow lower cholesterol diet.  Pt denies worsening depressive symptoms, suicidal ideation or panic. No fever, night sweats, wt loss, loss of appetite, or other constitutional symptoms.  Pt states good ability with ADL's, has low fall risk, home safety reviewed and adequate, no other significant changes in hearing or vision, and only occasionally active with exercise.  A1c is elevated this visit,  Wt Readings from Last 3 Encounters:  02/25/14 248 lb (112.492 kg)  10/16/13 254 lb 3.2 oz (115.304 kg)  08/31/13 245 lb 12.8 oz (111.494 kg)  Wt down 6 lbs, but stopped the metformin when a1c was about 5.8.  Possibly less active recently.  Past Medical History  Diagnosis Date  . Dermatophytosis of groin and perianal area 05/31/2008  . THRUSH 04/04/2007  . HYPOGONADISM 06/13/2009  . GLUCOSE INTOLERANCE 04/04/2007  . HYPERLIPIDEMIA 01/20/2007  . DEPRESSION 01/17/2007  . ADD 01/17/2007  . OBSTRUCTIVE SLEEP APNEA 04/04/2007  . OTITIS MEDIA, ACUTE, BILATERAL 06/13/2009  . SINUSITIS- ACUTE-NOS 04/04/2007  . GERD 01/20/2007  . DIVERTICULOSIS, COLON 04/06/2007  . HEMATOCHEZIA 07/26/2008  . Gross hematuria 01/27/2009  . Acute prostatitis 04/04/2007  . 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 LOW BACK PAIN 01/20/2007  . VERTIGO 06/13/2009  . INSOMNIA 04/04/2007  . SWEATING 09/30/2007  . RASH-NONVESICULAR 08/26/2009  . Flushing 08/26/2009  . Dysuria 01/27/2009  . URINARY RETENTION 01/27/2009  . FLANK PAIN, LEFT 09/30/2007  .  PSA, INCREASED 11/18/2009  . LIBIDO, DECREASED 12/02/2008  . PANCREATITIS, HX OF 01/17/2007  . TONSILLECTOMY AND ADENOIDECTOMY, HX OF 01/17/2007  . URI 07/25/2010  . Impaired glucose tolerance 12/10/2010   Past Surgical History  Procedure Laterality Date  . Cholecystectomy  2005  . Vasectomy  1987  . Hernia repair  1957  . Tonsillectomy    . Hemorrhoid banding  2015    reports that he has never smoked. He has never used smokeless tobacco. He reports that he does not drink alcohol or use illicit drugs. family history includes Coronary artery disease in his other; Diabetes in his other; Emphysema in his father; Heart disease in his father. There is no history of Colon cancer, Esophageal cancer, Rectal cancer, or Stomach cancer. Allergies  Allergen Reactions  . Pravastatin Sodium     REACTION: nausea  Pt does not recall this reaction   Current Outpatient Prescriptions on File Prior to Visit  Medication Sig Dispense Refill  . aspirin 81 MG EC tablet Take 81 mg by mouth daily.        . cyclobenzaprine (FLEXERIL) 5 MG tablet Take 1 tablet (5 mg total) by mouth 3 (three) times daily as needed for muscle spasms.  60 tablet  1  . ibuprofen (ADVIL,MOTRIN) 200 MG tablet 2-3 by mouth once daily as needed for back pain       . lovastatin (MEVACOR) 20 MG tablet TAKE 1 BY MOUTH DAILY  90 tablet  2  . lovastatin (MEVACOR) 20 MG tablet TAKE 1 BY MOUTH DAILY  90  tablet  2  . meclizine (ANTIVERT) 12.5 MG tablet 1-2 tabs by mouth every 8 hrs as needed for dizziness  90 tablet  0  . sertraline (ZOLOFT) 100 MG tablet TAKE 1 BY MOUTH DAILY  90 tablet  2  . sertraline (ZOLOFT) 100 MG tablet TAKE 1 BY MOUTH DAILY  90 tablet  2  . tamsulosin (FLOMAX) 0.4 MG CAPS capsule TAKE 1 BY MOUTH DAILY  90 capsule  2  . tamsulosin (FLOMAX) 0.4 MG CAPS capsule TAKE 1 BY MOUTH DAILY  90 capsule  2  . zolpidem (AMBIEN) 10 MG tablet Take 1 tablet (10 mg total) by mouth at bedtime.  90 tablet  1   No current  facility-administered medications on file prior to visit.    Review of Systems Constitutional: Negative for increased diaphoresis, other activity, appetite or other siginficant weight change  HENT: Negative for worsening hearing loss, ear pain, facial swelling, mouth sores and neck stiffness.   Eyes: Negative for other worsening pain, redness or visual disturbance.  Respiratory: Negative for shortness of breath and wheezing.   Cardiovascular: Negative for chest pain and palpitations.  Gastrointestinal: Negative for diarrhea, blood in stool, abdominal distention or other pain Genitourinary: Negative for hematuria, flank pain or change in urine volume.  Musculoskeletal: Negative for myalgias or other joint complaints.  Skin: Negative for color change and wound.  Neurological: Negative for syncope and numbness. other than noted Hematological: Negative for adenopathy. or other swelling Psychiatric/Behavioral: Negative for hallucinations, self-injury, decreased concentration or other worsening agitation.      Objective:   Physical Exam BP 112/68  Pulse 79  Temp(Src) 98.4 F (36.9 C) (Oral)  Wt 248 lb (112.492 kg)  SpO2 93% VS noted,  Constitutional: Pt is oriented to person, place, and time. Appears well-developed and well-nourished.  Head: Normocephalic and atraumatic.  Right Ear: External ear normal.  Left Ear: External ear normal.  Nose: Nose normal.  Mouth/Throat: Oropharynx is clear and moist.  Eyes: Conjunctivae and EOM are normal. Pupils are equal, round, and reactive to light.  Neck: Normal range of motion. Neck supple. No JVD present. No tracheal deviation present.  Cardiovascular: Normal rate, regular rhythm, normal heart sounds and intact distal pulses.   Pulmonary/Chest: Effort normal and breath sounds without rales or wheezing  Abdominal: Soft. Bowel sounds are normal. NT. No HSM  Musculoskeletal: Normal range of motion. Exhibits no edema.  Lymphadenopathy:  Has no  cervical adenopathy.  Neurological: Pt is alert and oriented to person, place, and time. Pt has normal reflexes. No cranial nerve deficit. Motor grossly intact Skin: Skin is warm and dry. No rash noted.  Psychiatric:  Has normal mood and affect. Behavior is normal.     Assessment & Plan:

## 2014-02-25 NOTE — Assessment & Plan Note (Signed)
Uncontrolled, to re-start the metformin er 500- 2 qd

## 2014-02-25 NOTE — Assessment & Plan Note (Signed)

## 2014-02-25 NOTE — Progress Notes (Signed)
Pre visit review using our clinic review tool, if applicable. No additional management support is needed unless otherwise documented below in the visit note. 

## 2014-02-25 NOTE — Assessment & Plan Note (Signed)
Due for colonoscopy screening f/u - will order

## 2014-02-25 NOTE — Addendum Note (Signed)
Addended by: Sharon Seller B on: 02/25/2014 08:50 AM   Modules accepted: Orders

## 2014-02-25 NOTE — Assessment & Plan Note (Signed)
Goal ldl < 70 - for increased lovastatin to 40 qd

## 2014-03-10 ENCOUNTER — Encounter: Payer: Self-pay | Admitting: Gastroenterology

## 2014-04-08 ENCOUNTER — Ambulatory Visit (AMBULATORY_SURGERY_CENTER): Payer: Self-pay | Admitting: *Deleted

## 2014-04-08 VITALS — Ht 73.0 in | Wt 249.0 lb

## 2014-04-08 DIAGNOSIS — Z1211 Encounter for screening for malignant neoplasm of colon: Secondary | ICD-10-CM

## 2014-04-08 MED ORDER — NA SULFATE-K SULFATE-MG SULF 17.5-3.13-1.6 GM/177ML PO SOLN: 1.0000 | Freq: Once | ORAL | Status: DC

## 2014-04-08 NOTE — Progress Notes (Signed)
Denies allergies to eggs or soy products. Denies complications with sedation or anesthesia. Denies O2 use. Denies use of diet or weight loss medications.  Emmi instructions given for colonoscopy.  

## 2014-04-09 ENCOUNTER — Encounter: Payer: Self-pay | Admitting: Internal Medicine

## 2014-04-09 MED ORDER — CICLOPIROX 0.77 % EX GEL: CUTANEOUS | Status: DC

## 2014-04-09 NOTE — Telephone Encounter (Signed)
Done erx 

## 2014-04-13 ENCOUNTER — Encounter: Payer: Self-pay | Admitting: Gastroenterology

## 2014-04-27 ENCOUNTER — Encounter: Payer: BC Managed Care – PPO | Admitting: Gastroenterology

## 2014-07-05 ENCOUNTER — Encounter: Payer: Self-pay | Admitting: Gastroenterology

## 2014-07-05 ENCOUNTER — Ambulatory Visit (AMBULATORY_SURGERY_CENTER): Payer: 59 | Admitting: Gastroenterology

## 2014-07-05 VITALS — BP 107/60 | HR 66 | Temp 97.3°F | Resp 17 | Ht 72.0 in | Wt 248.0 lb

## 2014-07-05 DIAGNOSIS — Z1211 Encounter for screening for malignant neoplasm of colon: Secondary | ICD-10-CM

## 2014-07-05 DIAGNOSIS — K5730 Diverticulosis of large intestine without perforation or abscess without bleeding: Secondary | ICD-10-CM

## 2014-07-05 MED ORDER — SODIUM CHLORIDE 0.9 % IV SOLN: 500.0000 mL | INTRAVENOUS | Status: DC

## 2014-07-05 NOTE — Patient Instructions (Signed)

## 2014-07-05 NOTE — Op Note (Signed)
[facility]  [address]. [place], [id number]   COLONOSCOPY PROCEDURE REPORT  PATIENT: Paul Tucker, Paul Tucker  MR#: [id number] BIRTHDATE: 03-08-1951 , 9  yrs. old GENDER: male ENDOSCOPIST: Inda Castle, MD REFERRED BY: PROCEDURE DATE:  07/05/2014 PROCEDURE:   Colonoscopy, diagnostic First Screening Colonoscopy - Avg.  risk and is 50 yrs.  old or older - No.  Prior Negative Screening - Now for repeat screening. 10 or more years since last screening  History of Adenoma - Now for follow-up colonoscopy & has been > or = to 3 yrs.  N/A  Polyps Removed Today? No.  Recommend repeat exam, <10 yrs? No. ASA CLASS:   Class II INDICATIONS:average risk patient for colon cancer. MEDICATIONS: Monitored anesthesia care and Propofol 320 mg IV  DESCRIPTION OF PROCEDURE:   After the risks benefits and alternatives of the procedure were thoroughly explained, informed consent was obtained.  The digital rectal exam revealed no abnormalities of the rectum.   The LB [id number]  endoscope was introduced through the anus and advanced to the cecum, which was identified by both the appendix and ileocecal valve. No adverse events experienced.   The quality of the prep was excellent using Suprep  The instrument was then slowly withdrawn as the colon was fully examined.      COLON FINDINGS: There was moderate diverticulosis noted in the descending colon and sigmoid colon with associated petechiae and muscular hypertrophy.   There was mild diverticulosis noted in the ascending colon and transverse colon.   The examination was otherwise normal.  Retroflexed views revealed no abnormalities. The time to cecum=6 minutes 37 seconds.  Withdrawal time=6 minutes 23 seconds.  The scope was withdrawn and the procedure completed. COMPLICATIONS: There were no immediate complications.  ENDOSCOPIC IMPRESSION: 1.   There was moderate diverticulosis noted in the descending colon and sigmoid  colon 2.   There was mild diverticulosis noted in the ascending colon and transverse colon 3.   The examination was otherwise normal  RECOMMENDATIONS: Continue current colorectal screening recommendations for "routine risk" patients with a repeat colonoscopy in 10 years.  eSigned:  Inda Castle, MD 07/05/2014 8:42 AM   cc:   PATIENT NAME:  Paul Tucker, Paul Tucker MR#: [id number]

## 2014-07-05 NOTE — Progress Notes (Signed)
A/ox3, pleased with MAC, report to RN 

## 2014-07-06 ENCOUNTER — Telehealth: Payer: Self-pay | Admitting: *Deleted

## 2014-07-06 NOTE — Telephone Encounter (Signed)
  Follow up Call-  Call back number 07/05/2014 11/13/2012  Post procedure Call Back phone  # [phone] cell  Permission to leave phone message Yes Yes     Patient questions:  Do you have a fever, pain , or abdominal swelling? No. Pain Score  0 *  Have you tolerated food without any problems? Yes.    Have you been able to return to your normal activities? Yes.    Do you have any questions about your discharge instructions: Diet   No. Medications  No. Follow up visit  No.  Do you have questions or concerns about your Care? No.  Actions: * If pain score is 4 or above: No action needed, pain <4.

## 2014-07-16 LAB — HM DIABETES EYE EXAM

## 2014-07-19 ENCOUNTER — Encounter: Payer: Self-pay | Admitting: Internal Medicine

## 2014-07-20 MED ORDER — ZOLPIDEM TARTRATE 10 MG PO TABS: 10.0000 mg | ORAL_TABLET | Freq: Every day | ORAL | Status: DC

## 2014-07-20 NOTE — Telephone Encounter (Signed)
Done hardcopy to Cherina  

## 2014-07-21 ENCOUNTER — Other Ambulatory Visit: Payer: Self-pay

## 2014-07-21 NOTE — Telephone Encounter (Signed)
Rx faxed to [facility] per patient.

## 2014-07-30 ENCOUNTER — Encounter: Payer: Self-pay | Admitting: Internal Medicine

## 2014-08-25 ENCOUNTER — Other Ambulatory Visit (INDEPENDENT_AMBULATORY_CARE_PROVIDER_SITE_OTHER): Payer: 59

## 2014-08-25 DIAGNOSIS — E119 Type 2 diabetes mellitus without complications: Secondary | ICD-10-CM | POA: Diagnosis not present

## 2014-08-25 LAB — LIPID PANEL
CHOLESTEROL: 138 mg/dL (ref 0–200)
HDL: 38.3 mg/dL — ABNORMAL LOW (ref >39.00)
LDL Cholesterol: 80 mg/dL (ref 0–99)
NonHDL: 99.7
Total CHOL/HDL Ratio: 4
Triglycerides: 97 mg/dL (ref 0.0–149.0)
VLDL: 19.4 mg/dL (ref 0.0–40.0)

## 2014-08-25 LAB — HEPATIC FUNCTION PANEL
ALBUMIN: 4.1 g/dL (ref 3.5–5.2)
ALT: 23 U/L (ref 0–53)
AST: 20 U/L (ref 0–37)
Alkaline Phosphatase: 79 U/L (ref 39–117)
BILIRUBIN TOTAL: 0.6 mg/dL (ref 0.2–1.2)
Bilirubin, Direct: 0.2 mg/dL (ref 0.0–0.3)
Total Protein: 7.2 g/dL (ref 6.0–8.3)

## 2014-08-25 LAB — BASIC METABOLIC PANEL
BUN: 14 mg/dL (ref 6–23)
CHLORIDE: 103 meq/L (ref 96–112)
CO2: 26 mEq/L (ref 19–32)
Calcium: 9.3 mg/dL (ref 8.4–10.5)
Creatinine, Ser: 1.04 mg/dL (ref 0.40–1.50)
GFR: 76.68 mL/min (ref >60.00)
Glucose, Bld: 166 mg/dL — ABNORMAL HIGH (ref 70–99)
POTASSIUM: 4.1 meq/L (ref 3.5–5.1)
Sodium: 135 mEq/L (ref 135–145)

## 2014-08-25 LAB — HEMOGLOBIN A1C: HEMOGLOBIN A1C: 6.9 % — AB (ref 4.6–6.5)

## 2014-08-30 ENCOUNTER — Other Ambulatory Visit: Payer: Self-pay | Admitting: *Deleted

## 2014-08-30 MED ORDER — SERTRALINE HCL 100 MG PO TABS: ORAL_TABLET | ORAL | Status: DC

## 2014-08-31 ENCOUNTER — Ambulatory Visit (INDEPENDENT_AMBULATORY_CARE_PROVIDER_SITE_OTHER): Payer: 59 | Admitting: Internal Medicine

## 2014-08-31 ENCOUNTER — Encounter: Payer: Self-pay | Admitting: Internal Medicine

## 2014-08-31 VITALS — BP 136/88 | HR 78 | Temp 98.8°F | Resp 18 | Ht 72.0 in | Wt 238.0 lb

## 2014-08-31 DIAGNOSIS — Z0189 Encounter for other specified special examinations: Secondary | ICD-10-CM | POA: Diagnosis not present

## 2014-08-31 DIAGNOSIS — E785 Hyperlipidemia, unspecified: Secondary | ICD-10-CM | POA: Diagnosis not present

## 2014-08-31 DIAGNOSIS — F329 Major depressive disorder, single episode, unspecified: Secondary | ICD-10-CM

## 2014-08-31 DIAGNOSIS — F32A Depression, unspecified: Secondary | ICD-10-CM

## 2014-08-31 DIAGNOSIS — E119 Type 2 diabetes mellitus without complications: Secondary | ICD-10-CM

## 2014-08-31 DIAGNOSIS — Z0000 Encounter for general adult medical examination without abnormal findings: Secondary | ICD-10-CM

## 2014-08-31 NOTE — Patient Instructions (Signed)

## 2014-08-31 NOTE — Assessment & Plan Note (Signed)
stable overall by history and exam, recent data reviewed with pt, and pt to continue medical treatment as before,  to f/u any worsening symptoms or concerns Lab Results  Component Value Date   HGBA1C 6.9* 08/25/2014

## 2014-08-31 NOTE — Assessment & Plan Note (Signed)
stable overall by history and exam, recent data reviewed with pt, and pt to continue medical treatment as before,  to f/u any worsening symptoms or concerns Lab Results  Component Value Date   WBC 6.9 02/10/2014   HGB 14.7 02/10/2014   HCT 42.4 02/10/2014   PLT 236.0 02/10/2014   GLUCOSE 166* 08/25/2014   CHOL 138 08/25/2014   TRIG 97.0 08/25/2014   HDL 38.30* 08/25/2014   LDLCALC 80 08/25/2014   ALT 23 08/25/2014   AST 20 08/25/2014   NA 135 08/25/2014   K 4.1 08/25/2014   CL 103 08/25/2014   CREATININE 1.04 08/25/2014   BUN 14 08/25/2014   CO2 26 08/25/2014   TSH 2.55 02/10/2014   PSA 3.00 02/10/2014   HGBA1C 6.9* 08/25/2014   MICROALBUR 6.9* 02/10/2014

## 2014-08-31 NOTE — Progress Notes (Signed)
Subjective:    Patient ID: Paul Tucker, male    DOB: 1951-05-06, 64 y.o.   MRN: [id number]  HPI  Here to f/u; overall doing ok,  Pt denies chest pain, increasing sob or doe, wheezing, orthopnea, PND, increased LE swelling, palpitations, dizziness or syncope.  Pt denies new neurological symptoms such as new headache, or facial or extremity weakness or numbness.  Pt denies polydipsia, polyuria, or low sugar episode.   Pt denies new neurological symptoms such as new headache, or facial or extremity weakness or numbness.   Pt states overall good compliance with meds, mostly trying to follow appropriate diet, with wt overall stable,  but little exercise however. Wt Readings from Last 3 Encounters:  08/31/14 238 lb (107.956 kg)  07/05/14 248 lb (112.492 kg)  04/08/14 249 lb (112.946 kg)  Denies worsening depressive symptoms, suicidal ideation, or panic Past Medical History  Diagnosis Date  . Dermatophytosis of groin and perianal area 05/31/2008  . THRUSH 04/04/2007  . HYPOGONADISM 06/13/2009  . GLUCOSE INTOLERANCE 04/04/2007  . HYPERLIPIDEMIA 01/20/2007  . DEPRESSION 01/17/2007  . ADD 01/17/2007  . OBSTRUCTIVE SLEEP APNEA 04/04/2007  . OTITIS MEDIA, ACUTE, BILATERAL 06/13/2009  . SINUSITIS- ACUTE-NOS 04/04/2007  . GERD 01/20/2007  . DIVERTICULOSIS, COLON 04/06/2007  . HEMATOCHEZIA 07/26/2008  . Gross hematuria 01/27/2009  . Acute prostatitis 04/04/2007  . 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 LOW BACK PAIN 01/20/2007  . VERTIGO 06/13/2009  . INSOMNIA 04/04/2007  . SWEATING 09/30/2007  . RASH-NONVESICULAR 08/26/2009  . Flushing 08/26/2009  . Dysuria 01/27/2009  . URINARY RETENTION 01/27/2009  . FLANK PAIN, LEFT 09/30/2007  . PSA, INCREASED 11/18/2009  . LIBIDO, DECREASED 12/02/2008  . PANCREATITIS, HX OF 01/17/2007  . TONSILLECTOMY AND ADENOIDECTOMY, HX OF 01/17/2007  . URI 07/25/2010  . Impaired glucose tolerance 12/10/2010   Past Surgical History  Procedure Laterality Date  . Cholecystectomy  2005  .  Vasectomy  1987  . Hernia repair  1957  . Tonsillectomy    . Hemorrhoid banding  2015    reports that he has never smoked. He has never used smokeless tobacco. He reports that he drinks alcohol. He reports that he does not use illicit drugs. family history includes Coronary artery disease in his other; Diabetes in his other; Emphysema in his father; Heart disease in his father. There is no history of Colon cancer, Esophageal cancer, Rectal cancer, or Stomach cancer. Allergies  Allergen Reactions  . Pravastatin Sodium     REACTION: nausea  Pt does not recall this reaction   Current Outpatient Prescriptions on File Prior to Visit  Medication Sig Dispense Refill  . aspirin 81 MG EC tablet Take 81 mg by mouth daily.      . Ciclopirox 0.77 % gel Use as directed topcially 45 g 1  . ibuprofen (ADVIL,MOTRIN) 200 MG tablet 2-3 by mouth once daily as needed for back pain     . lovastatin (MEVACOR) 40 MG tablet Take 1 tablet (40 mg total) by mouth at bedtime. 90 tablet 3  . meclizine (ANTIVERT) 12.5 MG tablet 1-2 tabs by mouth every 8 hrs as needed for dizziness 90 tablet 0  . metFORMIN (GLUCOPHAGE-XR) 500 MG 24 hr tablet 2 tabs by mouth in the AM 180 tablet 3  . sertraline (ZOLOFT) 100 MG tablet TAKE 1 BY MOUTH DAILY 90 tablet 2  . tamsulosin (FLOMAX) 0.4 MG CAPS capsule TAKE 1 BY MOUTH DAILY 90 capsule 2  . zolpidem (AMBIEN) 10 MG tablet  Take 1 tablet (10 mg total) by mouth at bedtime. 90 tablet 1   No current facility-administered medications on file prior to visit.   Review of Systems  Constitutional: Negative for unusual diaphoresis or night sweats HENT: Negative for ringing in ear or discharge Eyes: Negative for double vision or worsening visual disturbance.  Respiratory: Negative for choking and stridor.   Gastrointestinal: Negative for vomiting or other signifcant bowel change Genitourinary: Negative for hematuria or change in urine volume.  Musculoskeletal: Negative for other MSK  pain or swelling Skin: Negative for color change and worsening wound.  Neurological: Negative for tremors and numbness other than noted  Psychiatric/Behavioral: Negative for decreased concentration or agitation other than above       Objective:   Physical Exam BP 136/88 mmHg  Pulse 78  Temp(Src) 98.8 F (37.1 C) (Oral)  Resp 18  Ht 6' (1.829 m)  Wt 238 lb (107.956 kg)  BMI 32.27 kg/m2  SpO2 95% VS noted, not ill appearing Constitutional: Pt appears in no significant distress HENT: Head: NCAT.  Right Ear: External ear normal.  Left Ear: External ear normal.  Eyes: . Pupils are equal, round, and reactive to light. Conjunctivae and EOM are normal Neck: Normal range of motion. Neck supple.  Cardiovascular: Normal rate and regular rhythm.   Pulmonary/Chest: Effort normal and breath sounds without rales or wheezing.  Neurological: Pt is alert. Not confused , motor grossly intact Skin: Skin is warm. No rash, no LE edema Psychiatric: Pt behavior is normal. No agitation. not depressed affect        Assessment & Plan:

## 2014-08-31 NOTE — Assessment & Plan Note (Signed)
stable overall by history and exam, recent data reviewed with pt, and pt to continue medical treatment as before,  to f/u any worsening symptoms or concerns Lab Results  Component Value Date   LDLCALC 80 08/25/2014

## 2014-08-31 NOTE — Progress Notes (Signed)
Pre visit review using our clinic review tool, if applicable. No additional management support is needed unless otherwise documented below in the visit note. 

## 2014-09-01 ENCOUNTER — Encounter: Payer: Self-pay | Admitting: Internal Medicine

## 2014-10-14 ENCOUNTER — Encounter: Payer: Self-pay | Admitting: Internal Medicine

## 2014-10-14 MED ORDER — TAMSULOSIN HCL 0.4 MG PO CAPS: ORAL_CAPSULE | ORAL | Status: DC

## 2015-01-10 ENCOUNTER — Encounter: Payer: Self-pay | Admitting: Internal Medicine

## 2015-01-11 MED ORDER — ZOLPIDEM TARTRATE 10 MG PO TABS: 10.0000 mg | ORAL_TABLET | Freq: Every day | ORAL | Status: DC

## 2015-01-11 NOTE — Telephone Encounter (Signed)
Done hardcopy to Dahlia  

## 2015-03-07 ENCOUNTER — Other Ambulatory Visit (INDEPENDENT_AMBULATORY_CARE_PROVIDER_SITE_OTHER): Payer: 59

## 2015-03-07 DIAGNOSIS — E119 Type 2 diabetes mellitus without complications: Secondary | ICD-10-CM | POA: Diagnosis not present

## 2015-03-07 DIAGNOSIS — Z0189 Encounter for other specified special examinations: Secondary | ICD-10-CM | POA: Diagnosis not present

## 2015-03-07 DIAGNOSIS — Z0000 Encounter for general adult medical examination without abnormal findings: Secondary | ICD-10-CM

## 2015-03-07 LAB — BASIC METABOLIC PANEL
BUN: 15 mg/dL (ref 6–23)
CALCIUM: 9.3 mg/dL (ref 8.4–10.5)
CO2: 26 mEq/L (ref 19–32)
CREATININE: 0.95 mg/dL (ref 0.40–1.50)
Chloride: 105 mEq/L (ref 96–112)
GFR: 84.98 mL/min (ref >60.00)
Glucose, Bld: 151 mg/dL — ABNORMAL HIGH (ref 70–99)
Potassium: 4.2 mEq/L (ref 3.5–5.1)
Sodium: 139 mEq/L (ref 135–145)

## 2015-03-07 LAB — CBC WITH DIFFERENTIAL/PLATELET
BASOS PCT: 0.6 % (ref 0.0–3.0)
Basophils Absolute: 0 10*3/uL (ref 0.0–0.1)
EOS PCT: 4.4 % (ref 0.0–5.0)
Eosinophils Absolute: 0.2 10*3/uL (ref 0.0–0.7)
HCT: 38.5 % — ABNORMAL LOW (ref 39.0–52.0)
Hemoglobin: 13.4 g/dL (ref 13.0–17.0)
LYMPHS ABS: 1.2 10*3/uL (ref 0.7–4.0)
Lymphocytes Relative: 24.2 % (ref 12.0–46.0)
MCHC: 34.7 g/dL (ref 30.0–36.0)
MCV: 98 fl (ref 78.0–100.0)
Monocytes Absolute: 0.3 10*3/uL (ref 0.1–1.0)
Monocytes Relative: 5.8 % (ref 3.0–12.0)
NEUTROS ABS: 3.1 10*3/uL (ref 1.4–7.7)
NEUTROS PCT: 65 % (ref 43.0–77.0)
Platelets: 218 10*3/uL (ref 150.0–400.0)
RBC: 3.93 Mil/uL — AB (ref 4.22–5.81)
RDW: 13.2 % (ref 11.5–15.5)
WBC: 4.8 10*3/uL (ref 4.0–10.5)

## 2015-03-07 LAB — URINALYSIS, ROUTINE W REFLEX MICROSCOPIC
BILIRUBIN URINE: NEGATIVE
Hgb urine dipstick: NEGATIVE
KETONES UR: NEGATIVE
LEUKOCYTES UA: NEGATIVE
Nitrite: NEGATIVE
PH: 5.5 (ref 5.0–8.0)
SPECIFIC GRAVITY, URINE: 1.025 (ref 1.000–1.030)
TOTAL PROTEIN, URINE-UPE24: NEGATIVE
URINE GLUCOSE: 100 — AB
UROBILINOGEN UA: 0.2 (ref 0.0–1.0)

## 2015-03-07 LAB — HEPATIC FUNCTION PANEL
ALT: 20 U/L (ref 0–53)
AST: 17 U/L (ref 0–37)
Albumin: 4.3 g/dL (ref 3.5–5.2)
Alkaline Phosphatase: 60 U/L (ref 39–117)
BILIRUBIN TOTAL: 0.7 mg/dL (ref 0.2–1.2)
Bilirubin, Direct: 0.2 mg/dL (ref 0.0–0.3)
Total Protein: 7 g/dL (ref 6.0–8.3)

## 2015-03-07 LAB — HEMOGLOBIN A1C: HEMOGLOBIN A1C: 5.9 % (ref 4.6–6.5)

## 2015-03-07 LAB — MICROALBUMIN / CREATININE URINE RATIO
CREATININE, U: 142.8 mg/dL
MICROALB UR: 0.8 mg/dL (ref 0.0–1.9)
Microalb Creat Ratio: 0.6 mg/g (ref 0.0–30.0)

## 2015-03-07 LAB — LIPID PANEL
Cholesterol: 130 mg/dL (ref 0–200)
HDL: 41.1 mg/dL (ref >39.00)
LDL Cholesterol: 69 mg/dL (ref 0–99)
NonHDL: 89.34
Total CHOL/HDL Ratio: 3
Triglycerides: 101 mg/dL (ref 0.0–149.0)
VLDL: 20.2 mg/dL (ref 0.0–40.0)

## 2015-03-07 LAB — PSA: PSA: 2.89 ng/mL (ref 0.10–4.00)

## 2015-03-07 LAB — TSH: TSH: 1.96 u[IU]/mL (ref 0.35–4.50)

## 2015-03-09 ENCOUNTER — Encounter: Payer: Self-pay | Admitting: Internal Medicine

## 2015-03-10 ENCOUNTER — Encounter: Payer: Self-pay | Admitting: Internal Medicine

## 2015-03-10 ENCOUNTER — Ambulatory Visit (INDEPENDENT_AMBULATORY_CARE_PROVIDER_SITE_OTHER): Payer: 59 | Admitting: Internal Medicine

## 2015-03-10 VITALS — BP 136/82 | HR 74 | Temp 98.8°F | Wt 237.8 lb

## 2015-03-10 DIAGNOSIS — Z23 Encounter for immunization: Secondary | ICD-10-CM

## 2015-03-10 DIAGNOSIS — Z0000 Encounter for general adult medical examination without abnormal findings: Secondary | ICD-10-CM

## 2015-03-10 DIAGNOSIS — E1140 Type 2 diabetes mellitus with diabetic neuropathy, unspecified: Secondary | ICD-10-CM

## 2015-03-10 MED ORDER — ZOLPIDEM TARTRATE 10 MG PO TABS: 10.0000 mg | ORAL_TABLET | Freq: Every day | ORAL | Status: DC

## 2015-03-10 MED ORDER — METFORMIN HCL ER 500 MG PO TB24: ORAL_TABLET | ORAL | Status: DC

## 2015-03-10 MED ORDER — LOVASTATIN 40 MG PO TABS: 40.0000 mg | ORAL_TABLET | Freq: Every day | ORAL | Status: DC

## 2015-03-10 NOTE — Progress Notes (Signed)
Subjective:    Patient ID: Paul Tucker, male    DOB: [birth year], 64 y.o.   MRN: [id number]  HPI  Here for wellness and f/u;  Overall doing ok;  Pt denies Chest pain, worsening SOB, DOE, wheezing, orthopnea, PND, worsening LE edema, palpitations, dizziness or syncope.  Pt denies neurological change such as new headache, facial or extremity weakness.  Pt denies polydipsia, polyuria, or low sugar symptoms. Pt states overall good compliance with treatment and medications, good tolerability, and has been trying to follow appropriate diet.  Pt denies worsening depressive symptoms, suicidal ideation or panic. No fever, night sweats, wt loss, loss of appetite, or other constitutional symptoms.  Pt states good ability with ADL's, has low fall risk, home safety reviewed and adequate, no other significant changes in hearing or vision, and only occasionally active with exercise. No specific complaints. Wt Readings from Last 3 Encounters:  03/10/15 237 lb 12 oz (107.843 kg)  08/31/14 238 lb (107.956 kg)  07/05/14 248 lb (112.492 kg)   Past Medical History  Diagnosis Date  . Dermatophytosis of groin and perianal area 05/31/2008  . THRUSH 04/04/2007  . HYPOGONADISM 06/13/2009  . GLUCOSE INTOLERANCE 04/04/2007  . HYPERLIPIDEMIA 01/20/2007  . DEPRESSION 01/17/2007  . ADD 01/17/2007  . OBSTRUCTIVE SLEEP APNEA 04/04/2007  . OTITIS MEDIA, ACUTE, BILATERAL 06/13/2009  . SINUSITIS- ACUTE-NOS 04/04/2007  . GERD 01/20/2007  . DIVERTICULOSIS, COLON 04/06/2007  . HEMATOCHEZIA 07/26/2008  . Gross hematuria 01/27/2009  . Acute prostatitis 04/04/2007  . 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 LOW BACK PAIN 01/20/2007  . VERTIGO 06/13/2009  . INSOMNIA 04/04/2007  . SWEATING 09/30/2007  . RASH-NONVESICULAR 08/26/2009  . Flushing 08/26/2009  . Dysuria 01/27/2009  . URINARY RETENTION 01/27/2009  . FLANK PAIN, LEFT 09/30/2007  . PSA, INCREASED 11/18/2009  . LIBIDO, DECREASED 12/02/2008  . PANCREATITIS, HX OF 01/17/2007  . TONSILLECTOMY  AND ADENOIDECTOMY, HX OF 01/17/2007  . URI 07/25/2010  . Impaired glucose tolerance 12/10/2010   Past Surgical History  Procedure Laterality Date  . Cholecystectomy  2005  . Vasectomy  1987  . Hernia repair  1957  . Tonsillectomy    . Hemorrhoid banding  2015    reports that he has never smoked. He has never used smokeless tobacco. He reports that he drinks alcohol. He reports that he does not use illicit drugs. family history includes Coronary artery disease in his other; Diabetes in his other; Emphysema in his father; Heart disease in his father. There is no history of Colon cancer, Esophageal cancer, Rectal cancer, or Stomach cancer. Allergies  Allergen Reactions  . Pravastatin Sodium     REACTION: nausea  Pt does not recall this reaction   Current Outpatient Prescriptions on File Prior to Visit  Medication Sig Dispense Refill  . aspirin 81 MG EC tablet Take 81 mg by mouth daily.      . Ciclopirox 0.77 % gel Use as directed topcially 45 g 1  . ibuprofen (ADVIL,MOTRIN) 200 MG tablet 2-3 by mouth once daily as needed for back pain     . meclizine (ANTIVERT) 12.5 MG tablet 1-2 tabs by mouth every 8 hrs as needed for dizziness 90 tablet 0  . sertraline (ZOLOFT) 100 MG tablet TAKE 1 BY MOUTH DAILY 90 tablet 2  . tamsulosin (FLOMAX) 0.4 MG CAPS capsule TAKE 1 BY MOUTH DAILY 90 capsule 3   No current facility-administered medications on file prior to visit.   Review of Systems Constitutional: Negative for increased  diaphoresis, other activity, appetite or siginficant weight change other than noted HENT: Negative for worsening hearing loss, ear pain, facial swelling, mouth sores and neck stiffness.   Eyes: Negative for other worsening pain, redness or visual disturbance.  Respiratory: Negative for shortness of breath and wheezing  Cardiovascular: Negative for chest pain and palpitations.  Gastrointestinal: Negative for diarrhea, blood in stool, abdominal distention or other  pain Genitourinary: Negative for hematuria, flank pain or change in urine volume.  Musculoskeletal: Negative for myalgias or other joint complaints.  Skin: Negative for color change and wound or drainage.  Neurological: Negative for syncope and numbness. other than noted Hematological: Negative for adenopathy. or other swelling Psychiatric/Behavioral: Negative for hallucinations, SI, self-injury, decreased concentration or other worsening agitation.      Objective:   Physical Exam BP 136/82 mmHg  Pulse 74  Temp(Src) 98.8 F (37.1 C)  Wt 237 lb 12 oz (107.843 kg)  SpO2 95% VS noted,  Constitutional: Pt is oriented to person, place, and time. Appears well-developed and well-nourished, in no significant distress Head: Normocephalic and atraumatic.  Right Ear: External ear normal.  Left Ear: External ear normal.  Nose: Nose normal.  Mouth/Throat: Oropharynx is clear and moist.  Eyes: Conjunctivae and EOM are normal. Pupils are equal, round, and reactive to light.  Neck: Normal range of motion. Neck supple. No JVD present. No tracheal deviation present or significant neck LA or mass Cardiovascular: Normal rate, regular rhythm, normal heart sounds and intact distal pulses.   Pulmonary/Chest: Effort normal and breath sounds without rales or wheezing  Abdominal: Soft. Bowel sounds are normal. NT. No HSM  Musculoskeletal: Normal range of motion. Exhibits no edema.  Lymphadenopathy:  Has no cervical adenopathy.  Neurological: Pt is alert and oriented to person, place, and time. Pt has normal reflexes. No cranial nerve deficit. Motor grossly intact, decreased sens to LT bilat toes Skin: Skin is warm and dry. No rash noted.  Psychiatric:  Has normal mood and affect. Behavior is normal.      Assessment & Plan:

## 2015-03-10 NOTE — Assessment & Plan Note (Signed)

## 2015-03-10 NOTE — Progress Notes (Signed)
Pre visit review using our clinic review tool, if applicable. No additional management support is needed unless otherwise documented below in the visit note. 

## 2015-03-10 NOTE — Patient Instructions (Addendum)
You had the flu shot today  Please make Nurse Appt for 2 wks for the Prevnar 13, or wait to next visit  OK to reduce the metformin to 1 per day  Please continue all other medications as before, and refills have been done if requested.  Please have the pharmacy call with any other refills you may need.  Please continue your efforts at being more active, low cholesterol diet, and weight control.  You are otherwise up to date with prevention measures today.  Please keep your appointments with your specialists as you may have planned  Please return in 6 months, or sooner if needed, with Lab testing done 3-5 days before

## 2015-03-10 NOTE — Assessment & Plan Note (Signed)
stable overall by history and exam, recent data reviewed with pt, and pt to continue medical treatment as before,  to f/u any worsening symptoms or concerns Lab Results  Component Value Date   HGBA1C 5.9 03/07/2015   OK to reduce the metformin to 1 per day

## 2015-05-04 ENCOUNTER — Telehealth: Payer: Self-pay | Admitting: Internal Medicine

## 2015-05-04 MED ORDER — CICLOPIROX 0.77 % EX GEL: CUTANEOUS | Status: DC

## 2015-05-04 NOTE — Telephone Encounter (Signed)
Patient requesting refill for Ciclopirox 0.77 % gel [id number] Ciclopirox 0.77 % gel [id number] Pharmacy is [facility] on [address].

## 2015-06-12 ENCOUNTER — Other Ambulatory Visit: Payer: Self-pay | Admitting: Internal Medicine

## 2015-08-17 LAB — HM DIABETES EYE EXAM

## 2015-08-18 ENCOUNTER — Encounter: Payer: Self-pay | Admitting: Internal Medicine

## 2015-08-18 ENCOUNTER — Ambulatory Visit (INDEPENDENT_AMBULATORY_CARE_PROVIDER_SITE_OTHER): Payer: 59 | Admitting: Internal Medicine

## 2015-08-18 VITALS — BP 140/80 | HR 75 | Temp 98.4°F | Resp 20 | Wt 240.0 lb

## 2015-08-18 DIAGNOSIS — J069 Acute upper respiratory infection, unspecified: Secondary | ICD-10-CM | POA: Insufficient documentation

## 2015-08-18 DIAGNOSIS — E1140 Type 2 diabetes mellitus with diabetic neuropathy, unspecified: Secondary | ICD-10-CM | POA: Diagnosis not present

## 2015-08-18 MED ORDER — HYDROCODONE-HOMATROPINE 5-1.5 MG/5ML PO SYRP: 5.0000 mL | ORAL_SOLUTION | Freq: Four times a day (QID) | ORAL | Status: DC | PRN

## 2015-08-18 MED ORDER — LEVOFLOXACIN 500 MG PO TABS: 500.0000 mg | ORAL_TABLET | Freq: Every day | ORAL | Status: DC

## 2015-08-18 NOTE — Progress Notes (Signed)
Pre visit review using our clinic review tool, if applicable. No additional management support is needed unless otherwise documented below in the visit note. 

## 2015-08-18 NOTE — Progress Notes (Signed)
Subjective:    Patient ID: Paul Tucker, male    DOB: 1950/12/04, 65 y.o.   MRN: [id number]  HPI   Here with 2-3 days acute onset fever, facial pain, pressure, headache, general weakness and malaise, and greenish d/c, with mild ST and cough, but pt denies chest pain, wheezing, increased sob or doe, orthopnea, PND, increased LE swelling, palpitations, dizziness or syncope.  Pt denies new neurological symptoms such as new headache, or facial or extremity weakness or numbness.  Pt denies polydipsia, polyuria  Denies worsening depressive symptoms, suicidal ideation, or panic; has ongoing anxiety, not increased recently.  Past Medical History  Diagnosis Date  . Dermatophytosis of groin and perianal area 05/31/2008  . THRUSH 04/04/2007  . HYPOGONADISM 06/13/2009  . GLUCOSE INTOLERANCE 04/04/2007  . HYPERLIPIDEMIA 01/20/2007  . DEPRESSION 01/17/2007  . ADD 01/17/2007  . OBSTRUCTIVE SLEEP APNEA 04/04/2007  . OTITIS MEDIA, ACUTE, BILATERAL 06/13/2009  . SINUSITIS- ACUTE-NOS 04/04/2007  . GERD 01/20/2007  . DIVERTICULOSIS, COLON 04/06/2007  . HEMATOCHEZIA 07/26/2008  . Gross hematuria 01/27/2009  . Acute prostatitis 04/04/2007  . 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 LOW BACK PAIN 01/20/2007  . VERTIGO 06/13/2009  . INSOMNIA 04/04/2007  . SWEATING 09/30/2007  . RASH-NONVESICULAR 08/26/2009  . Flushing 08/26/2009  . Dysuria 01/27/2009  . URINARY RETENTION 01/27/2009  . FLANK PAIN, LEFT 09/30/2007  . PSA, INCREASED 11/18/2009  . LIBIDO, DECREASED 12/02/2008  . PANCREATITIS, HX OF 01/17/2007  . TONSILLECTOMY AND ADENOIDECTOMY, HX OF 01/17/2007  . URI 07/25/2010  . Impaired glucose tolerance 12/10/2010   Past Surgical History  Procedure Laterality Date  . Cholecystectomy  2005  . Vasectomy  1987  . Hernia repair  1957  . Tonsillectomy    . Hemorrhoid banding  2015    reports that he has never smoked. He has never used smokeless tobacco. He reports that he drinks alcohol. He reports that he does not use illicit  drugs. family history includes Coronary artery disease in his other; Diabetes in his other; Emphysema in his father; Heart disease in his father. There is no history of Colon cancer, Esophageal cancer, Rectal cancer, or Stomach cancer. Allergies  Allergen Reactions  . Pravastatin Sodium     REACTION: nausea  Pt does not recall this reaction   Current Outpatient Prescriptions on File Prior to Visit  Medication Sig Dispense Refill  . aspirin 81 MG EC tablet Take 81 mg by mouth daily.      . Ciclopirox 0.77 % gel Use as directed topcially 45 g 1  . ibuprofen (ADVIL,MOTRIN) 200 MG tablet 2-3 by mouth once daily as needed for back pain     . lovastatin (MEVACOR) 40 MG tablet Take 1 tablet (40 mg total) by mouth at bedtime. 90 tablet 3  . meclizine (ANTIVERT) 12.5 MG tablet 1-2 tabs by mouth every 8 hrs as needed for dizziness 90 tablet 0  . metFORMIN (GLUCOPHAGE-XR) 500 MG 24 hr tablet 1 tabs by mouth in the AM 90 tablet 3  . sertraline (ZOLOFT) 100 MG tablet Take 1 tablet by mouth  daily 90 tablet 1  . tamsulosin (FLOMAX) 0.4 MG CAPS capsule TAKE 1 BY MOUTH DAILY 90 capsule 3  . zolpidem (AMBIEN) 10 MG tablet Take 1 tablet (10 mg total) by mouth at bedtime. 90 tablet 1   No current facility-administered medications on file prior to visit.   ,Review of Systems  Constitutional: Negative for unusual diaphoresis or night sweats HENT: Negative for ear swelling  or discharge Eyes: Negative for worsening visual haziness  Respiratory: Negative for choking and stridor.   Gastrointestinal: Negative for distension or worsening eructation Genitourinary: Negative for retention or change in urine volume.  Musculoskeletal: Negative for other MSK pain or swelling Skin: Negative for color change and worsening wound Neurological: Negative for tremors and numbness other than noted  Psychiatric/Behavioral: Negative for decreased concentration or agitation other than above       Objective:   Physical  Exam BP 140/80 mmHg  Pulse 75  Temp(Src) 98.4 F (36.9 C) (Oral)  Resp 20  Wt 240 lb (108.863 kg)  SpO2 95% VS noted, mild ill Constitutional: Pt appears in no apparent distress HENT: Head: NCAT.  Right Ear: External ear normal.  Left Ear: External ear normal.  Bilat tm's with mild erythema.  Max sinus areas non tender.  Pharynx with mild erythema, no exudate Eyes: . Pupils are equal, round, and reactive to light. Conjunctivae and EOM are normal Neck: Normal range of motion. Neck supple.  Cardiovascular: Normal rate and regular rhythm.   Pulmonary/Chest: Effort normal and breath sounds without rales or wheezing.  Neurological: Pt is alert. Not confused , motor grossly intact Skin: Skin is warm. No rash, no LE edema Psychiatric: Pt behavior is normal. No agitation.     Assessment & Plan:

## 2015-08-18 NOTE — Patient Instructions (Signed)
Please take all new medication as prescribed - the antibiotic, and the cough medicine as needed  Please continue all other medications as before  Please have the pharmacy call with any other refills you may need.  Please keep your appointments with your specialists as you may have planned

## 2015-08-18 NOTE — Assessment & Plan Note (Signed)
stable overall by history and exam, recent data reviewed with pt, and pt to continue medical treatment as before,  to f/u any worsening symptoms or concerns Lab Results  Component Value Date   HGBA1C 5.9 03/07/2015

## 2015-08-18 NOTE — Assessment & Plan Note (Signed)
Mild to mod, for antibx course,  to f/u any worsening symptoms or concerns 

## 2015-09-01 ENCOUNTER — Encounter: Payer: Self-pay | Admitting: Internal Medicine

## 2015-09-08 ENCOUNTER — Ambulatory Visit: Payer: 59 | Admitting: Internal Medicine

## 2015-09-08 ENCOUNTER — Other Ambulatory Visit (INDEPENDENT_AMBULATORY_CARE_PROVIDER_SITE_OTHER): Payer: 59

## 2015-09-08 DIAGNOSIS — E1140 Type 2 diabetes mellitus with diabetic neuropathy, unspecified: Secondary | ICD-10-CM | POA: Diagnosis not present

## 2015-09-08 DIAGNOSIS — Z0000 Encounter for general adult medical examination without abnormal findings: Secondary | ICD-10-CM

## 2015-09-08 LAB — HEPATITIS C ANTIBODY: HCV Ab: NEGATIVE

## 2015-09-08 LAB — LIPID PANEL
CHOL/HDL RATIO: 3
CHOLESTEROL: 159 mg/dL (ref 0–200)
HDL: 46.9 mg/dL (ref >39.00)
LDL CALC: 91 mg/dL (ref 0–99)
NONHDL: 112.05
Triglycerides: 105 mg/dL (ref 0.0–149.0)
VLDL: 21 mg/dL (ref 0.0–40.0)

## 2015-09-08 LAB — BASIC METABOLIC PANEL
BUN: 18 mg/dL (ref 6–23)
CALCIUM: 10 mg/dL (ref 8.4–10.5)
CO2: 27 mEq/L (ref 19–32)
Chloride: 101 mEq/L (ref 96–112)
Creatinine, Ser: 1.1 mg/dL (ref 0.40–1.50)
GFR: 71.64 mL/min (ref >60.00)
GLUCOSE: 176 mg/dL — AB (ref 70–99)
POTASSIUM: 4 meq/L (ref 3.5–5.1)
SODIUM: 137 meq/L (ref 135–145)

## 2015-09-08 LAB — HEPATIC FUNCTION PANEL
ALK PHOS: 65 U/L (ref 39–117)
ALT: 23 U/L (ref 0–53)
AST: 21 U/L (ref 0–37)
Albumin: 4.4 g/dL (ref 3.5–5.2)
BILIRUBIN DIRECT: 0.3 mg/dL (ref 0.0–0.3)
BILIRUBIN TOTAL: 1 mg/dL (ref 0.2–1.2)
Total Protein: 7.5 g/dL (ref 6.0–8.3)

## 2015-09-08 LAB — HEMOGLOBIN A1C: Hgb A1c MFr Bld: 7.1 % — ABNORMAL HIGH (ref 4.6–6.5)

## 2015-09-13 ENCOUNTER — Encounter: Payer: Self-pay | Admitting: Internal Medicine

## 2015-09-13 ENCOUNTER — Ambulatory Visit (INDEPENDENT_AMBULATORY_CARE_PROVIDER_SITE_OTHER): Payer: 59 | Admitting: Internal Medicine

## 2015-09-13 ENCOUNTER — Other Ambulatory Visit: Payer: Self-pay | Admitting: Internal Medicine

## 2015-09-13 VITALS — BP 140/78 | HR 89 | Temp 98.9°F | Resp 20 | Wt 240.0 lb

## 2015-09-13 DIAGNOSIS — F32A Depression, unspecified: Secondary | ICD-10-CM

## 2015-09-13 DIAGNOSIS — H1013 Acute atopic conjunctivitis, bilateral: Secondary | ICD-10-CM | POA: Diagnosis not present

## 2015-09-13 DIAGNOSIS — J309 Allergic rhinitis, unspecified: Secondary | ICD-10-CM

## 2015-09-13 DIAGNOSIS — E785 Hyperlipidemia, unspecified: Secondary | ICD-10-CM | POA: Diagnosis not present

## 2015-09-13 DIAGNOSIS — R6889 Other general symptoms and signs: Secondary | ICD-10-CM

## 2015-09-13 DIAGNOSIS — F329 Major depressive disorder, single episode, unspecified: Secondary | ICD-10-CM

## 2015-09-13 DIAGNOSIS — Z0001 Encounter for general adult medical examination with abnormal findings: Secondary | ICD-10-CM

## 2015-09-13 DIAGNOSIS — H1010 Acute atopic conjunctivitis, unspecified eye: Secondary | ICD-10-CM | POA: Insufficient documentation

## 2015-09-13 DIAGNOSIS — E1140 Type 2 diabetes mellitus with diabetic neuropathy, unspecified: Secondary | ICD-10-CM

## 2015-09-13 MED ORDER — CETIRIZINE HCL 10 MG PO TABS: 10.0000 mg | ORAL_TABLET | Freq: Every day | ORAL | Status: DC

## 2015-09-13 MED ORDER — METFORMIN HCL ER 500 MG PO TB24: ORAL_TABLET | ORAL | Status: DC

## 2015-09-13 MED ORDER — AZELASTINE HCL 0.05 % OP SOLN: 1.0000 [drp] | Freq: Two times a day (BID) | OPHTHALMIC | Status: DC

## 2015-09-13 MED ORDER — ESCITALOPRAM OXALATE 10 MG PO TABS: 10.0000 mg | ORAL_TABLET | Freq: Every day | ORAL | Status: DC

## 2015-09-13 MED ORDER — TRIAMCINOLONE ACETONIDE 55 MCG/ACT NA AERO: 2.0000 | INHALATION_SPRAY | Freq: Every day | NASAL | Status: DC

## 2015-09-13 NOTE — Progress Notes (Signed)
Pre visit review using our clinic review tool, if applicable. No additional management support is needed unless otherwise documented below in the visit note. 

## 2015-09-13 NOTE — Progress Notes (Signed)
Subjective:    Patient ID: Paul Tucker, male    DOB: January 10, 1951, 65 y.o.   MRN: [id number]  HPI  Here to f/u; overall doing ok,  Pt denies chest pain, increasing sob or doe, wheezing, orthopnea, PND, increased LE swelling, palpitations, dizziness or syncope.  Pt denies new neurological symptoms such as new headache, or facial or extremity weakness or numbness.  Pt denies polydipsia, polyuria, or low sugar episode.   Pt denies new neurological symptoms such as new headache, or facial or extremity weakness or numbness.   Pt states overall good compliance with meds, mostly trying to follow appropriate diet, with wt overall stable,  but little exercise however. Did have recent ST and congestion, took antibiotic, helped, but seemed to have returned without the fever, tends to get nasal burning, eyes itching, and nonprod cough. 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  No signficant worsening social pressures.  Had farily recent negative testing for sleep apnea, even has used mouthpiece at night.   BP Readings from Last 3 Encounters:  09/13/15 140/78  08/18/15 140/80  03/10/15 136/82   Past Medical History  Diagnosis Date  . Dermatophytosis of groin and perianal area 05/31/2008  . THRUSH 04/04/2007  . HYPOGONADISM 06/13/2009  . GLUCOSE INTOLERANCE 04/04/2007  . HYPERLIPIDEMIA 01/20/2007  . DEPRESSION 01/17/2007  . ADD 01/17/2007  . OBSTRUCTIVE SLEEP APNEA 04/04/2007  . OTITIS MEDIA, ACUTE, BILATERAL 06/13/2009  . SINUSITIS- ACUTE-NOS 04/04/2007  . GERD 01/20/2007  . DIVERTICULOSIS, COLON 04/06/2007  . HEMATOCHEZIA 07/26/2008  . Gross hematuria 01/27/2009  . Acute prostatitis 04/04/2007  . 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 LOW BACK PAIN 01/20/2007  . VERTIGO 06/13/2009  . INSOMNIA 04/04/2007  . SWEATING 09/30/2007  . RASH-NONVESICULAR 08/26/2009  . Flushing 08/26/2009  .  Dysuria 01/27/2009  . URINARY RETENTION 01/27/2009  . FLANK PAIN, LEFT 09/30/2007  . PSA, INCREASED 11/18/2009  . LIBIDO, DECREASED 12/02/2008  . PANCREATITIS, HX OF 01/17/2007  . TONSILLECTOMY AND ADENOIDECTOMY, HX OF 01/17/2007  . URI 07/25/2010  . Impaired glucose tolerance 12/10/2010   Past Surgical History  Procedure Laterality Date  . Cholecystectomy  2005  . Vasectomy  1987  . Hernia repair  1957  . Tonsillectomy    . Hemorrhoid banding  2015    reports that he has never smoked. He has never used smokeless tobacco. He reports that he drinks alcohol. He reports that he does not use illicit drugs. family history includes Coronary artery disease in his other; Diabetes in his other; Emphysema in his father; Heart disease in his father. There is no history of Colon cancer, Esophageal cancer, Rectal cancer, or Stomach cancer. Allergies  Allergen Reactions  . Pravastatin Sodium     REACTION: nausea  Pt does not recall this reaction   Current Outpatient Prescriptions on File Prior to Visit  Medication Sig Dispense Refill  . aspirin 81 MG EC tablet Take 81 mg by mouth daily.      . Ciclopirox 0.77 % gel Use as directed topcially 45 g 1  . HYDROcodone-homatropine (HYCODAN) 5-1.5 MG/5ML syrup Take 5 mLs by mouth every 6 (six) hours as needed for cough. 180 mL 0  . ibuprofen (ADVIL,MOTRIN) 200 MG tablet 2-3 by mouth once daily as needed for back pain     . lovastatin (MEVACOR) 40 MG tablet Take 1 tablet (40  mg total) by mouth at bedtime. 90 tablet 3  . meclizine (ANTIVERT) 12.5 MG tablet 1-2 tabs by mouth every 8 hrs as needed for dizziness 90 tablet 0   No current facility-administered medications on file prior to visit.   Review of Systems  Constitutional: Negative for unusual diaphoresis or night sweats HENT: Negative for ear swelling or discharge Eyes: Negative for worsening visual haziness  Respiratory: Negative for choking and stridor.   Gastrointestinal: Negative for distension or  worsening eructation Genitourinary: Negative for retention or change in urine volume.  Musculoskeletal: Negative for other MSK pain or swelling Skin: Negative for color change and worsening wound Neurological: Negative for tremors and numbness other than noted  Psychiatric/Behavioral: Negative for decreased concentration or agitation other than above       Objective:   Physical Exam BP 140/78 mmHg  Pulse 89  Temp(Src) 98.9 F (37.2 C) (Oral)  Resp 20  Wt 240 lb (108.863 kg)  SpO2 91% VS noted, obese, not ill appearing Constitutional: Pt appears in no apparent distress HENT: Head: NCAT.  Right Ear: External ear normal.  Left Ear: External ear normal.  Eyes: . Pupils are equal, round, and reactive to light. Conjunctivae with mild bilat erythema and EOM are normal Bilat tm's with mild erythema.  Max sinus areas non tender.  Pharynx with mild erythema, no exudate Neck: Normal range of motion. Neck supple.  Cardiovascular: Normal rate and regular rhythm.   Pulmonary/Chest: Effort normal and breath sounds without rales or wheezing.  Abd:  Soft, NT, ND, + BS Neurological: Pt is alert. Not confused , motor grossly intact Skin: Skin is warm. No rash, no LE edema Psychiatric: Pt behavior is normal. No agitation. + mild dysphoric     Assessment & Plan:

## 2015-09-13 NOTE — Patient Instructions (Addendum)
Please take all new medication as prescribed - the zyrtec and nasacort and Optivar eye drops for allergies  OK to increase the metformin to 2 pills in the AM  OK to stop the zoloft  Please take all new medication as prescribed - the lexapro 10 mg per day  Please continue all other medications as before, and refills have been done if requested.  Please have the pharmacy call with any other refills you may need.  Please continue your efforts at being more active, low cholesterol diet, and weight control.  Please keep your appointments with your specialists as you may have planned  Please return in 6 months, or sooner if needed, with Lab testing done 3-5 days before

## 2015-09-14 ENCOUNTER — Other Ambulatory Visit: Payer: Self-pay | Admitting: Internal Medicine

## 2015-09-14 NOTE — Telephone Encounter (Signed)
Medication sent to pharmacy  

## 2015-09-14 NOTE — Telephone Encounter (Signed)
Done hardcopy to Corinne  

## 2015-09-14 NOTE — Telephone Encounter (Signed)
Please advise 

## 2015-09-15 NOTE — Assessment & Plan Note (Signed)
Mild to mod flare likely seasonal, for zyrtec/nasacort asd,  to f/u any worsening symptoms or concerns

## 2015-09-15 NOTE — Assessment & Plan Note (Signed)
Mild uncontrolled, denies Si or HI, for change SSRI to lexapro 10 qd, declines counseling referral

## 2015-09-15 NOTE — Assessment & Plan Note (Signed)
stable overall by history and exam, recent data reviewed with pt, and pt to continue medical treatment as before,  to f/u any worsening symptoms or concerns Lab Results  Component Value Date   LDLCALC 91 09/08/2015

## 2015-09-15 NOTE — Assessment & Plan Note (Signed)
Mild uncontrolled, to increased the metformin ER to 1000 qam, o/w stable overall by history and exam, recent data reviewed with pt, and pt to continue medical treatment as before,  to f/u any worsening symptoms or concerns

## 2015-09-15 NOTE — Assessment & Plan Note (Signed)
Mild to mod, for optivar asd,  to f/u any worsening symptoms or concerns 

## 2015-12-05 ENCOUNTER — Other Ambulatory Visit: Payer: Self-pay | Admitting: Internal Medicine

## 2016-03-09 ENCOUNTER — Other Ambulatory Visit (INDEPENDENT_AMBULATORY_CARE_PROVIDER_SITE_OTHER): Payer: 59

## 2016-03-09 DIAGNOSIS — Z0001 Encounter for general adult medical examination with abnormal findings: Secondary | ICD-10-CM

## 2016-03-09 DIAGNOSIS — J309 Allergic rhinitis, unspecified: Secondary | ICD-10-CM | POA: Diagnosis not present

## 2016-03-09 LAB — HEPATIC FUNCTION PANEL
ALK PHOS: 63 U/L (ref 39–117)
ALT: 18 U/L (ref 0–53)
AST: 15 U/L (ref 0–37)
Albumin: 4.6 g/dL (ref 3.5–5.2)
BILIRUBIN DIRECT: 0.2 mg/dL (ref 0.0–0.3)
TOTAL PROTEIN: 7.3 g/dL (ref 6.0–8.3)
Total Bilirubin: 1.1 mg/dL (ref 0.2–1.2)

## 2016-03-09 LAB — MICROALBUMIN / CREATININE URINE RATIO
CREATININE, U: 350.7 mg/dL
MICROALB UR: 2.8 mg/dL — AB (ref 0.0–1.9)
MICROALB/CREAT RATIO: 0.8 mg/g (ref 0.0–30.0)

## 2016-03-09 LAB — LIPID PANEL
CHOLESTEROL: 139 mg/dL (ref 0–200)
HDL: 39.4 mg/dL (ref >39.00)
LDL CALC: 66 mg/dL (ref 0–99)
NonHDL: 99.73
Total CHOL/HDL Ratio: 4
Triglycerides: 171 mg/dL — ABNORMAL HIGH (ref 0.0–149.0)
VLDL: 34.2 mg/dL (ref 0.0–40.0)

## 2016-03-09 LAB — URINALYSIS, ROUTINE W REFLEX MICROSCOPIC
KETONES UR: NEGATIVE
LEUKOCYTES UA: NEGATIVE
NITRITE: NEGATIVE
SPECIFIC GRAVITY, URINE: 1.025 (ref 1.000–1.030)
Urine Glucose: NEGATIVE
Urobilinogen, UA: 0.2 (ref 0.0–1.0)
pH: 5.5 (ref 5.0–8.0)

## 2016-03-09 LAB — CBC WITH DIFFERENTIAL/PLATELET
BASOS ABS: 0 10*3/uL (ref 0.0–0.1)
Basophils Relative: 0.5 % (ref 0.0–3.0)
EOS ABS: 0.2 10*3/uL (ref 0.0–0.7)
Eosinophils Relative: 4.2 % (ref 0.0–5.0)
HEMATOCRIT: 40 % (ref 39.0–52.0)
Hemoglobin: 14.2 g/dL (ref 13.0–17.0)
LYMPHS PCT: 24 % (ref 12.0–46.0)
Lymphs Abs: 1.3 10*3/uL (ref 0.7–4.0)
MCHC: 35.4 g/dL (ref 30.0–36.0)
MCV: 94.9 fl (ref 78.0–100.0)
MONOS PCT: 8.2 % (ref 3.0–12.0)
Monocytes Absolute: 0.5 10*3/uL (ref 0.1–1.0)
NEUTROS ABS: 3.5 10*3/uL (ref 1.4–7.7)
Neutrophils Relative %: 63.1 % (ref 43.0–77.0)
PLATELETS: 198 10*3/uL (ref 150.0–400.0)
RBC: 4.22 Mil/uL (ref 4.22–5.81)
RDW: 12.5 % (ref 11.5–15.5)
WBC: 5.6 10*3/uL (ref 4.0–10.5)

## 2016-03-09 LAB — BASIC METABOLIC PANEL
BUN: 18 mg/dL (ref 6–23)
CO2: 29 mEq/L (ref 19–32)
Calcium: 9.6 mg/dL (ref 8.4–10.5)
Chloride: 102 mEq/L (ref 96–112)
Creatinine, Ser: 1 mg/dL (ref 0.40–1.50)
GFR: 79.84 mL/min (ref >60.00)
GLUCOSE: 225 mg/dL — AB (ref 70–99)
POTASSIUM: 4.4 meq/L (ref 3.5–5.1)
SODIUM: 139 meq/L (ref 135–145)

## 2016-03-09 LAB — HEMOGLOBIN A1C: Hgb A1c MFr Bld: 7.5 % — ABNORMAL HIGH (ref 4.6–6.5)

## 2016-03-09 LAB — TSH: TSH: 1.84 u[IU]/mL (ref 0.35–4.50)

## 2016-03-09 LAB — PSA: PSA: 2.94 ng/mL (ref 0.10–4.00)

## 2016-03-13 ENCOUNTER — Ambulatory Visit (INDEPENDENT_AMBULATORY_CARE_PROVIDER_SITE_OTHER): Payer: 59 | Admitting: Internal Medicine

## 2016-03-13 ENCOUNTER — Encounter: Payer: Self-pay | Admitting: Internal Medicine

## 2016-03-13 VITALS — BP 128/80 | HR 77 | Temp 98.3°F | Resp 20 | Wt 238.0 lb

## 2016-03-13 DIAGNOSIS — E785 Hyperlipidemia, unspecified: Secondary | ICD-10-CM | POA: Diagnosis not present

## 2016-03-13 DIAGNOSIS — Z0001 Encounter for general adult medical examination with abnormal findings: Secondary | ICD-10-CM | POA: Diagnosis not present

## 2016-03-13 DIAGNOSIS — R809 Proteinuria, unspecified: Secondary | ICD-10-CM

## 2016-03-13 DIAGNOSIS — Z23 Encounter for immunization: Secondary | ICD-10-CM

## 2016-03-13 DIAGNOSIS — E1140 Type 2 diabetes mellitus with diabetic neuropathy, unspecified: Secondary | ICD-10-CM | POA: Diagnosis not present

## 2016-03-13 NOTE — Progress Notes (Signed)
Pre visit review using our clinic review tool, if applicable. No additional management support is needed unless otherwise documented below in the visit note. 

## 2016-03-13 NOTE — Progress Notes (Signed)
Subjective:    Patient ID: Paul Tucker, male    DOB: 21-Dec-1950, 65 y.o.   MRN: [id number]  HPI   Here for wellness and f/u;  Overall doing ok;  Pt denies Chest pain, worsening SOB, DOE, wheezing, orthopnea, PND, worsening LE edema, palpitations, dizziness or syncope.  Pt denies neurological change such as new headache, facial or extremity weakness.  . Pt states overall good compliance with treatment and medications, good tolerability, and has been trying to follow appropriate diet.  Pt denies worsening depressive symptoms, suicidal ideation or panic. No fever, night sweats, wt loss, loss of appetite, or other constitutional symptoms.  Pt states good ability with ADL's, has low fall risk, home safety reviewed and adequate, no other significant changes in hearing or vision, and only occasionally active with exercise. No other changes to basic hx.   Pt denies polydipsia, polyuria, or low sugar symptoms such as weakness or confusion improved with po intake.  Pt states overall good compliance with meds, trying to follow lower cholesterol, diabetic diet, wt overall stable but little exercise however.   Does also have evidence for microalbumin. Past Medical History:  Diagnosis Date  . Acute prostatitis 04/04/2007  . ADD 01/17/2007  . DEPRESSION 01/17/2007  . Dermatophytosis of groin and perianal area 05/31/2008  . DIVERTICULOSIS, COLON 04/06/2007  . Dysuria 01/27/2009  . FLANK PAIN, LEFT 09/30/2007  . Flushing 08/26/2009  . GERD 01/20/2007  . GLUCOSE INTOLERANCE 04/04/2007  . Gross hematuria 01/27/2009  . HEMATOCHEZIA 07/26/2008  . HYPERLIPIDEMIA 01/20/2007  . HYPOGONADISM 06/13/2009  . Impaired glucose tolerance 12/10/2010  . INSOMNIA 04/04/2007  . LIBIDO, DECREASED 12/02/2008  . 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 LOW BACK PAIN 01/20/2007  . OBSTRUCTIVE SLEEP APNEA 04/04/2007  . OTITIS MEDIA, ACUTE, BILATERAL 06/13/2009  . PANCREATITIS, HX OF 01/17/2007  . PSA, INCREASED 11/18/2009  . RASH-NONVESICULAR  08/26/2009  . SINUSITIS- ACUTE-NOS 04/04/2007  . SWEATING 09/30/2007  . THRUSH 04/04/2007  . TONSILLECTOMY AND ADENOIDECTOMY, HX OF 01/17/2007  . URI 07/25/2010  . URINARY RETENTION 01/27/2009  . VERTIGO 06/13/2009   Past Surgical History:  Procedure Laterality Date  . CHOLECYSTECTOMY  2005  . HEMORRHOID BANDING  2015  . HERNIA REPAIR  1957  . TONSILLECTOMY    . VASECTOMY  1987    reports that he has never smoked. He has never used smokeless tobacco. He reports that he drinks alcohol. He reports that he does not use drugs. family history includes Coronary artery disease in his other; Diabetes in his other; Emphysema in his father; Heart disease in his father. Allergies  Allergen Reactions  . Pravastatin Sodium     REACTION: nausea  Pt does not recall this reaction   Current Outpatient Prescriptions on File Prior to Visit  Medication Sig Dispense Refill  . aspirin 81 MG EC tablet Take 81 mg by mouth daily.      Marland Kitchen azelastine (OPTIVAR) 0.05 % ophthalmic solution Place 1 drop into both eyes 2 (two) times daily. 6 mL 12  . cetirizine (ZYRTEC) 10 MG tablet Take 1 tablet (10 mg total) by mouth daily. 30 tablet 11  . Ciclopirox 0.77 % gel Use as directed topcially 45 g 1  . ibuprofen (ADVIL,MOTRIN) 200 MG tablet 2-3 by mouth once daily as needed for back pain     . meclizine (ANTIVERT) 12.5 MG tablet 1-2 tabs by mouth every 8 hrs as needed for dizziness 90 tablet 0  . metFORMIN (GLUCOPHAGE-XR) 500 MG 24 hr  tablet 2 tabs by mouth in the AM 180 tablet 3  . triamcinolone (NASACORT AQ) 55 MCG/ACT AERO nasal inhaler Place 2 sprays into the nose daily. 1 Inhaler 12  . zolpidem (AMBIEN) 10 MG tablet TAKE 1 TABLET BY MOUTH AT BEDTIME 90 tablet 1  . escitalopram (LEXAPRO) 10 MG tablet Take 1 tablet (10 mg total) by mouth daily. 90 tablet 3   No current facility-administered medications on file prior to visit.    Review of Systems Constitutional: Negative for increased diaphoresis, or other activity,  appetite or siginficant weight change other than noted HENT: Negative for worsening hearing loss, ear pain, facial swelling, mouth sores and neck stiffness.   Eyes: Negative for other worsening pain, redness or visual disturbance.  Respiratory: Negative for choking or stridor Cardiovascular: Negative for other chest pain and palpitations.  Gastrointestinal: Negative for worsening diarrhea, blood in stool, or abdominal distention Genitourinary: Negative for hematuria, flank pain or change in urine volume.  Musculoskeletal: Negative for myalgias or other joint complaints.  Skin: Negative for other color change and wound or drainage.  Neurological: Negative for syncope and numbness. other than noted Hematological: Negative for adenopathy. or other swelling Psychiatric/Behavioral: Negative for hallucinations, SI, self-injury, decreased concentration or other worsening agitation.  All other system neg per pt    Objective:   Physical Exam BP 128/80   Pulse 77   Temp 98.3 F (36.8 C) (Oral)   Resp 20   Wt 238 lb (108 kg)   SpO2 97%   BMI 32.28 kg/m  VS noted,  Constitutional: Pt is oriented to person, place, and time. Appears well-developed and well-nourished, in no significant distress Head: Normocephalic and atraumatic  Eyes: Conjunctivae and EOM are normal. Pupils are equal, round, and reactive to light Right Ear: External ear normal.  Left Ear: External ear normal Nose: Nose normal.  Mouth/Throat: Oropharynx is clear and moist  Neck: Normal range of motion. Neck supple. No JVD present. No tracheal deviation present or significant neck LA or mass Cardiovascular: Normal rate, regular rhythm, normal heart sounds and intact distal pulses.   Pulmonary/Chest: Effort normal and breath sounds without rales or wheezing  Abdominal: Soft. Bowel sounds are normal. NT. No HSM  Musculoskeletal: Normal range of motion. Exhibits no edema Lymphadenopathy: Has no cervical adenopathy.  Neurological:  Pt is alert and oriented to person, place, and time. Pt has normal reflexes. No cranial nerve deficit. Motor grossly intact Skin: Skin is warm and dry. No rash noted or new ulcers Psychiatric:  Has normal mood and affect. Behavior is normal.  No other significant exam findings    Assessment & Plan:

## 2016-03-13 NOTE — Patient Instructions (Addendum)
You had the flu shot today  Please take all new medication as prescribed - the losartan 25 mg per day, and the januvia 100 mg per day  Please have the pharmacy call with an alternative like [place], if the [place] is not coverred by your insurance  Please continue all other medications as before, and refills have been done if requested.  Please have the pharmacy call with any other refills you may need.  Please continue your efforts at being more active, low cholesterol diet, and weight control.  You are otherwise up to date with prevention measures today.  Please keep your appointments with your specialists as you may have planned  Please return in 6 months, or sooner if needed, with Lab testing done 3-5 days before

## 2016-03-14 ENCOUNTER — Other Ambulatory Visit: Payer: Self-pay | Admitting: Internal Medicine

## 2016-03-19 NOTE — Assessment & Plan Note (Signed)
stable overall by history and exam, recent data reviewed with pt, and pt to continue medical treatment as before,  to f/u any worsening symptoms or concerns Lab Results  Component Value Date   LDLCALC 66 03/09/2016

## 2016-03-19 NOTE — Assessment & Plan Note (Signed)

## 2016-03-19 NOTE — Assessment & Plan Note (Signed)
Mild, for losartan 25 qd,  to f/u any worsening symptoms or concerns 

## 2016-03-19 NOTE — Assessment & Plan Note (Addendum)
Mild uncontrolled, o/w stable overall by history and exam, recent data reviewed with pt, and pt to continue medical treatment as before except to add januvia 100 qd,  to f/u any worsening symptoms or concerns Lab Results  Component Value Date   HGBA1C 7.5 (H) 03/09/2016   In addition to the time spent performing CPE, I spent an additional 15 minutes face to face,in which greater than 50% of this time was spent in counseling and coordination of care for patient's illness as documented.

## 2016-03-21 ENCOUNTER — Other Ambulatory Visit: Payer: Self-pay | Admitting: *Deleted

## 2016-03-21 MED ORDER — LOVASTATIN 40 MG PO TABS: 40.0000 mg | ORAL_TABLET | Freq: Every day | ORAL | 3 refills | Status: DC

## 2016-03-21 MED ORDER — ESCITALOPRAM OXALATE 10 MG PO TABS: 10.0000 mg | ORAL_TABLET | Freq: Every day | ORAL | 3 refills | Status: DC

## 2016-03-21 MED ORDER — SITAGLIPTIN PHOSPHATE 100 MG PO TABS: 100.0000 mg | ORAL_TABLET | Freq: Every day | ORAL | 3 refills | Status: DC

## 2016-03-21 NOTE — Telephone Encounter (Signed)
Rec'd call pt wife states when husband had cpx last week MD was suppose to send refills to [facility], but they stated they only received his tamulosin. Verified which med he is needing sent lexapro, januvia, and lovastatin electronically...[name]

## 2016-03-22 ENCOUNTER — Telehealth: Payer: Self-pay

## 2016-03-22 NOTE — Telephone Encounter (Signed)
PA initiated via CoverMyMeds key [id number]

## 2016-03-26 NOTE — Telephone Encounter (Signed)
PA APPROVED 03/24/2016 - 03/24/2017, pt advised of same

## 2016-04-01 ENCOUNTER — Other Ambulatory Visit: Payer: Self-pay | Admitting: Internal Medicine

## 2016-04-03 ENCOUNTER — Other Ambulatory Visit: Payer: Self-pay | Admitting: Internal Medicine

## 2016-04-03 NOTE — Telephone Encounter (Signed)
faxed

## 2016-04-03 NOTE — Telephone Encounter (Signed)
Done hardcopy to Corinne  

## 2016-04-04 ENCOUNTER — Telehealth: Payer: Self-pay | Admitting: *Deleted

## 2016-04-04 MED ORDER — LOSARTAN POTASSIUM 25 MG PO TABS: 25.0000 mg | ORAL_TABLET | Freq: Every day | ORAL | 3 refills | Status: DC

## 2016-04-04 NOTE — Telephone Encounter (Signed)
Called pt/wife no answer LMOM MD resent Losartan & [facility]pidem [facility].../lmb

## 2016-04-04 NOTE — Telephone Encounter (Signed)
Pt wife left msg on triage stating husband saw MD 10/24, and was suppose to send rx to [facility] for his Januvia & Losartan. [facility] has not received script for the Losartan. Per chart med is not on med list. Pls advise if pt need to be taking Losartan...Paul Tucker

## 2016-04-04 NOTE — Telephone Encounter (Signed)
Ok, this has been done, thanks

## 2016-06-14 ENCOUNTER — Encounter: Payer: Self-pay | Admitting: Internal Medicine

## 2016-06-14 LAB — HM DIABETES EYE EXAM

## 2016-07-01 ENCOUNTER — Other Ambulatory Visit: Payer: Self-pay | Admitting: Internal Medicine

## 2016-07-27 ENCOUNTER — Encounter: Payer: Self-pay | Admitting: Internal Medicine

## 2016-07-27 ENCOUNTER — Ambulatory Visit (INDEPENDENT_AMBULATORY_CARE_PROVIDER_SITE_OTHER): Payer: 59 | Admitting: Internal Medicine

## 2016-07-27 ENCOUNTER — Other Ambulatory Visit (INDEPENDENT_AMBULATORY_CARE_PROVIDER_SITE_OTHER): Payer: 59

## 2016-07-27 VITALS — BP 117/74 | HR 78 | Temp 98.6°F | Ht 73.0 in | Wt 230.0 lb

## 2016-07-27 DIAGNOSIS — M545 Low back pain, unspecified: Secondary | ICD-10-CM

## 2016-07-27 DIAGNOSIS — E1140 Type 2 diabetes mellitus with diabetic neuropathy, unspecified: Secondary | ICD-10-CM

## 2016-07-27 DIAGNOSIS — E785 Hyperlipidemia, unspecified: Secondary | ICD-10-CM

## 2016-07-27 DIAGNOSIS — Z0001 Encounter for general adult medical examination with abnormal findings: Secondary | ICD-10-CM

## 2016-07-27 LAB — BASIC METABOLIC PANEL
BUN: 12 mg/dL (ref 6–23)
CHLORIDE: 102 meq/L (ref 96–112)
CO2: 29 meq/L (ref 19–32)
CREATININE: 0.89 mg/dL (ref 0.40–1.50)
Calcium: 9.9 mg/dL (ref 8.4–10.5)
GFR: 91.22 mL/min (ref >60.00)
Glucose, Bld: 129 mg/dL — ABNORMAL HIGH (ref 70–99)
POTASSIUM: 4 meq/L (ref 3.5–5.1)
Sodium: 137 mEq/L (ref 135–145)

## 2016-07-27 LAB — URINALYSIS, ROUTINE W REFLEX MICROSCOPIC
Bilirubin Urine: NEGATIVE
HGB URINE DIPSTICK: NEGATIVE
Ketones, ur: NEGATIVE
Leukocytes, UA: NEGATIVE
Nitrite: NEGATIVE
PH: 6 (ref 5.0–8.0)
RBC / HPF: NONE SEEN (ref 0–?)
SPECIFIC GRAVITY, URINE: 1.015 (ref 1.000–1.030)
TOTAL PROTEIN, URINE-UPE24: NEGATIVE
URINE GLUCOSE: NEGATIVE
Urobilinogen, UA: 0.2 (ref 0.0–1.0)

## 2016-07-27 LAB — HEPATIC FUNCTION PANEL
ALBUMIN: 4.5 g/dL (ref 3.5–5.2)
ALK PHOS: 57 U/L (ref 39–117)
ALT: 14 U/L (ref 0–53)
AST: 13 U/L (ref 0–37)
Bilirubin, Direct: 0.3 mg/dL (ref 0.0–0.3)
Total Bilirubin: 1 mg/dL (ref 0.2–1.2)
Total Protein: 7.5 g/dL (ref 6.0–8.3)

## 2016-07-27 LAB — HEMOGLOBIN A1C: Hgb A1c MFr Bld: 6.5 % (ref 4.6–6.5)

## 2016-07-27 LAB — LIPID PANEL
CHOL/HDL RATIO: 3
Cholesterol: 132 mg/dL (ref 0–200)
HDL: 43.2 mg/dL (ref >39.00)
LDL Cholesterol: 73 mg/dL (ref 0–99)
NONHDL: 89.16
TRIGLYCERIDES: 79 mg/dL (ref 0.0–149.0)
VLDL: 15.8 mg/dL (ref 0.0–40.0)

## 2016-07-27 LAB — MICROALBUMIN / CREATININE URINE RATIO
Creatinine,U: 127.3 mg/dL
MICROALB/CREAT RATIO: 0.8 mg/g (ref 0.0–30.0)
Microalb, Ur: 1 mg/dL (ref 0.0–1.9)

## 2016-07-27 MED ORDER — CYCLOBENZAPRINE HCL 5 MG PO TABS: 5.0000 mg | ORAL_TABLET | Freq: Three times a day (TID) | ORAL | 1 refills | Status: DC | PRN

## 2016-07-27 NOTE — Progress Notes (Signed)
Subjective:    Patient ID: Paul Tucker, male    DOB: 23-Sep-1950, 66 y.o.   MRN: [id number]  HPI  Here with c/o 2 wks onset left lower back pain after moved a heavy casket sideways in the back of a vehicle to reposition it. Mild to mod, burning and pinching, seems worse later in the day, worse to stand up and twist, wife concerned about possible recurrence of prior kidney stone, Denies urinary symptoms such as dysuria, frequency, urgency, flank pain, hematuria or n/v, fever, chills.  Pt dneies bowel or bladder change, fever, wt loss,  worsening LE pain/numbness/weakness, gait change or falls.   Pt denies polydipsia, polyuria, or low sugar symptoms such as weakness or confusion improved with po intake.  Pt states overall good compliance with meds, trying to follow lower cholesterol, diabetic diet, wt overall stable but little exercise however.     Pt denies new neurological symptoms such as new headache, or facial or extremity weakness or numbness  Pt denies chest pain, increased sob or doe, wheezing, orthopnea, PND, increased LE swelling, palpitations, dizziness or syncope. Past Medical History:  Diagnosis Date  . Acute prostatitis 04/04/2007  . ADD 01/17/2007  . DEPRESSION 01/17/2007  . Dermatophytosis of groin and perianal area 05/31/2008  . DIVERTICULOSIS, COLON 04/06/2007  . Dysuria 01/27/2009  . FLANK PAIN, LEFT 09/30/2007  . Flushing 08/26/2009  . GERD 01/20/2007  . GLUCOSE INTOLERANCE 04/04/2007  . Gross hematuria 01/27/2009  . HEMATOCHEZIA 07/26/2008  . HYPERLIPIDEMIA 01/20/2007  . HYPOGONADISM 06/13/2009  . Impaired glucose tolerance 12/10/2010  . INSOMNIA 04/04/2007  . LIBIDO, DECREASED 12/02/2008  . 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 LOW BACK PAIN 01/20/2007  . OBSTRUCTIVE SLEEP APNEA 04/04/2007  . OTITIS MEDIA, ACUTE, BILATERAL 06/13/2009  . PANCREATITIS, HX OF 01/17/2007  . PSA, INCREASED 11/18/2009  . RASH-NONVESICULAR 08/26/2009  . SINUSITIS- ACUTE-NOS 04/04/2007  . SWEATING 09/30/2007  .  THRUSH 04/04/2007  . TONSILLECTOMY AND ADENOIDECTOMY, HX OF 01/17/2007  . URI 07/25/2010  . URINARY RETENTION 01/27/2009  . VERTIGO 06/13/2009   Past Surgical History:  Procedure Laterality Date  . CHOLECYSTECTOMY  2005  . HEMORRHOID BANDING  2015  . HERNIA REPAIR  1957  . TONSILLECTOMY    . VASECTOMY  1987    reports that he has never smoked. He has never used smokeless tobacco. He reports that he drinks alcohol. He reports that he does not use drugs. family history includes Coronary artery disease in his other; Diabetes in his other; Emphysema in his father; Heart disease in his father. Allergies  Allergen Reactions  . Pravastatin Sodium     REACTION: nausea  Pt does not recall this reaction   Current Outpatient Prescriptions on File Prior to Visit  Medication Sig Dispense Refill  . aspirin 81 MG EC tablet Take 81 mg by mouth daily.      . Ciclopirox 0.77 % gel Use as directed topcially 45 g 1  . ibuprofen (ADVIL,MOTRIN) 200 MG tablet 2-3 by mouth once daily as needed for back pain     . lovastatin (MEVACOR) 40 MG tablet Take 1 tablet (40 mg total) by mouth at bedtime. 90 tablet 3  . meclizine (ANTIVERT) 12.5 MG tablet 1-2 tabs by mouth every 8 hrs as needed for dizziness 90 tablet 0  . tamsulosin (FLOMAX) 0.4 MG CAPS capsule TAKE 1 CAPSULE BY MOUTH  DAILY 90 capsule 0  . triamcinolone (NASACORT AQ) 55 MCG/ACT AERO nasal inhaler Place 2 sprays into the  nose daily. 1 Inhaler 12  . zolpidem (AMBIEN) 10 MG tablet TAKE 1 TABLET BY MOUTH AT BEDTIME 90 tablet 0  . escitalopram (LEXAPRO) 10 MG tablet Take 1 tablet (10 mg total) by mouth daily. 90 tablet 3   No current facility-administered medications on file prior to visit.    Review of Systems  Constitutional: Negative for unusual diaphoresis or night sweats HENT: Negative for ear swelling or discharge Eyes: Negative for worsening visual haziness  Respiratory: Negative for choking and stridor.   Gastrointestinal: Negative for  distension or worsening eructation Genitourinary: Negative for retention or change in urine volume.  Musculoskeletal: Negative for other MSK pain or swelling Skin: Negative for color change and worsening wound Neurological: Negative for tremors and numbness other than noted  Psychiatric/Behavioral: Negative for decreased concentration or agitation other than above   All other system neg per pt    Objective:   Physical Exam BP 117/74   Pulse 78   Temp 98.6 F (37 C)   Ht 6\' 1"  (1.854 m)   Wt 230 lb (104.3 kg)   SpO2 99%   BMI 30.34 kg/m  VS noted,  Constitutional: Pt appears in no apparent distress HENT: Head: NCAT.  Right Ear: External ear normal.  Left Ear: External ear normal.  Eyes: . Pupils are equal, round, and reactive to light. Conjunctivae and EOM are normal Neck: Normal range of motion. Neck supple.  Cardiovascular: Normal rate and regular rhythm.   Pulmonary/Chest: Effort normal and breath sounds without rales or wheezing.  Abd:  Soft, NT, ND, + BS Spine nontender; + left lumbar paravertebral tender spasm Neurological: Pt is alert. Not confused , motor grossly intact Skin: Skin is warm. No rash, no LE edema Psychiatric: Pt behavior is normal. No agitation.  No other exam findings Lab Results  Component Value Date   WBC 5.6 03/09/2016   HGB 14.2 03/09/2016   HCT 40.0 03/09/2016   PLT 198.0 03/09/2016   GLUCOSE 129 (H) 07/27/2016   CHOL 132 07/27/2016   TRIG 79.0 07/27/2016   HDL 43.20 07/27/2016   LDLCALC 73 07/27/2016   ALT 14 07/27/2016   AST 13 07/27/2016   NA 137 07/27/2016   K 4.0 07/27/2016   CL 102 07/27/2016   CREATININE 0.89 07/27/2016   BUN 12 07/27/2016   CO2 29 07/27/2016   TSH 1.84 03/09/2016   PSA 2.94 03/09/2016   HGBA1C 6.5 07/27/2016   MICROALBUR 1.0 07/27/2016       Assessment & Plan:

## 2016-07-27 NOTE — Patient Instructions (Addendum)
Please take all new medication as prescribed - the muscle relaxer as needed  Please continue all other medications as before, and refills have been done if requested.  Please have the pharmacy call with any other refills you may need.  Please continue your efforts at being more active, low cholesterol diet, and weight control.  Please keep your appointments with your specialists as you may have planned  Please go to the LAB in the Basement (turn left off the elevator) for the tests to be done today  You will be contacted by phone if any changes need to be made immediately.  Otherwise, you will receive a letter about your results with an explanation, but please check with MyChart first.  Please remember to sign up for MyChart if you have not done so, as this will be important to you in the future with finding out test results, communicating by private email, and scheduling acute appointments online when needed.  Please return in 6 months, or sooner if needed, with Lab testing done 3-5 days before  OK to cancel the April appt

## 2016-07-28 NOTE — Assessment & Plan Note (Signed)
stable overall by history and exam, recent data reviewed with pt, and pt to continue medical treatment as before,  to f/u any worsening symptoms or concerns Lab Results  Component Value Date   LDLCALC 73 07/27/2016

## 2016-07-28 NOTE — Assessment & Plan Note (Signed)
stable overall by history and exam, recent data reviewed with pt, and pt to continue medical treatment as before,  to f/u any worsening symptoms or concerns Lab Results  Component Value Date   HGBA1C 6.5 07/27/2016

## 2016-07-28 NOTE — Assessment & Plan Note (Signed)
C/w msk strain persistent, for UA but doubt renal stone related, for flexeril tid prn,  to f/u any worsening symptoms or concerns

## 2016-09-05 ENCOUNTER — Encounter: Payer: Self-pay | Admitting: Internal Medicine

## 2016-09-05 ENCOUNTER — Ambulatory Visit (INDEPENDENT_AMBULATORY_CARE_PROVIDER_SITE_OTHER): Payer: 59 | Admitting: Internal Medicine

## 2016-09-05 DIAGNOSIS — F329 Major depressive disorder, single episode, unspecified: Secondary | ICD-10-CM

## 2016-09-05 NOTE — Progress Notes (Signed)
Pre visit review using our clinic review tool, if applicable. No additional management support is needed unless otherwise documented below in the visit note. 

## 2016-09-09 NOTE — Patient Instructions (Signed)
Pt left without beeing seen

## 2016-09-09 NOTE — Assessment & Plan Note (Signed)
Pt left before being seen

## 2016-09-09 NOTE — Progress Notes (Signed)
   Subjective:    Patient ID: Paul Tucker, male    DOB: 12-04-1950, 66 y.o.   MRN: [id number]  HPI pt left before being seen    Review of Systems     Objective:   Physical Exam        Assessment & Plan:

## 2016-09-11 ENCOUNTER — Ambulatory Visit (INDEPENDENT_AMBULATORY_CARE_PROVIDER_SITE_OTHER): Payer: 59 | Admitting: Internal Medicine

## 2016-09-11 ENCOUNTER — Ambulatory Visit: Payer: 59 | Admitting: Internal Medicine

## 2016-09-11 ENCOUNTER — Encounter: Payer: Self-pay | Admitting: Internal Medicine

## 2016-09-11 VITALS — BP 118/66 | HR 72 | Ht 72.0 in | Wt 234.0 lb

## 2016-09-11 DIAGNOSIS — F988 Other specified behavioral and emotional disorders with onset usually occurring in childhood and adolescence: Secondary | ICD-10-CM | POA: Diagnosis not present

## 2016-09-11 DIAGNOSIS — F329 Major depressive disorder, single episode, unspecified: Secondary | ICD-10-CM

## 2016-09-11 DIAGNOSIS — F419 Anxiety disorder, unspecified: Secondary | ICD-10-CM | POA: Diagnosis not present

## 2016-09-11 DIAGNOSIS — F32A Depression, unspecified: Secondary | ICD-10-CM

## 2016-09-11 MED ORDER — ARIPIPRAZOLE 5 MG PO TABS
5.0000 mg | ORAL_TABLET | Freq: Every day | ORAL | 5 refills | Status: DC
Start: 2016-09-11 — End: 2017-03-06

## 2016-09-11 MED ORDER — ESCITALOPRAM OXALATE 20 MG PO TABS
20.0000 mg | ORAL_TABLET | Freq: Every day | ORAL | 3 refills | Status: DC
Start: 2016-09-11 — End: 2017-03-06

## 2016-09-11 NOTE — Progress Notes (Signed)
Subjective:    Patient ID: Paul Tucker, male    DOB: 07-Feb-1951, 66 y.o.   MRN: [id number]  HPI  Here with 1 mo onset worsening depressoin symptoms despite significant life stressors, no energy, interest, enjoyment, less active, irritable and short temper, constant complaining of fatigue and takes a big effort to get anything done.  No SI or HI.  Has had several 'acting up" with telling people off a few times which he normally does not do this.  Has been on lexapro 10 mg over 1 yr, No overt panic attacks or ED visits.  Has been on zoloft prior to lexapro, even about 15 yrs ago, and prozac prior to that. Pt denies chest pain, increased sob or doe, wheezing, orthopnea, PND, increased LE swelling, palpitations, dizziness or syncope.  .   Past Medical History:  Diagnosis Date  . Acute prostatitis 04/04/2007  . ADD 01/17/2007  . DEPRESSION 01/17/2007  . Dermatophytosis of groin and perianal area 05/31/2008  . DIVERTICULOSIS, COLON 04/06/2007  . Dysuria 01/27/2009  . FLANK PAIN, LEFT 09/30/2007  . Flushing 08/26/2009  . GERD 01/20/2007  . GLUCOSE INTOLERANCE 04/04/2007  . Gross hematuria 01/27/2009  . HEMATOCHEZIA 07/26/2008  . HYPERLIPIDEMIA 01/20/2007  . HYPOGONADISM 06/13/2009  . Impaired glucose tolerance 12/10/2010  . INSOMNIA 04/04/2007  . LIBIDO, DECREASED 12/02/2008  . 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 LOW BACK PAIN 01/20/2007  . OBSTRUCTIVE SLEEP APNEA 04/04/2007  . OTITIS MEDIA, ACUTE, BILATERAL 06/13/2009  . PANCREATITIS, HX OF 01/17/2007  . PSA, INCREASED 11/18/2009  . RASH-NONVESICULAR 08/26/2009  . SINUSITIS- ACUTE-NOS 04/04/2007  . SWEATING 09/30/2007  . THRUSH 04/04/2007  . TONSILLECTOMY AND ADENOIDECTOMY, HX OF 01/17/2007  . URI 07/25/2010  . URINARY RETENTION 01/27/2009  . VERTIGO 06/13/2009   Past Surgical History:  Procedure Laterality Date  . CHOLECYSTECTOMY  2005  . HEMORRHOID BANDING  2015  . HERNIA REPAIR  1957  . TONSILLECTOMY    . VASECTOMY  1987    reports that he has never  smoked. He has never used smokeless tobacco. He reports that he drinks alcohol. He reports that he does not use drugs. family history includes Coronary artery disease in his other; Diabetes in his other; Emphysema in his father; Heart disease in his father. Allergies  Allergen Reactions  . Pravastatin Sodium     REACTION: nausea  Pt does not recall this reaction   Current Outpatient Prescriptions on File Prior to Visit  Medication Sig Dispense Refill  . aspirin 81 MG EC tablet Take 81 mg by mouth daily.      . Ciclopirox 0.77 % gel Use as directed topcially 45 g 1  . cyclobenzaprine (FLEXERIL) 5 MG tablet Take 1 tablet (5 mg total) by mouth 3 (three) times daily as needed for muscle spasms. 40 tablet 1  . ibuprofen (ADVIL,MOTRIN) 200 MG tablet 2-3 by mouth once daily as needed for back pain     . lovastatin (MEVACOR) 40 MG tablet Take 1 tablet (40 mg total) by mouth at bedtime. 90 tablet 3  . meclizine (ANTIVERT) 12.5 MG tablet 1-2 tabs by mouth every 8 hrs as needed for dizziness 90 tablet 0  . tamsulosin (FLOMAX) 0.4 MG CAPS capsule TAKE 1 CAPSULE BY MOUTH  DAILY 90 capsule 0  . triamcinolone (NASACORT AQ) 55 MCG/ACT AERO nasal inhaler Place 2 sprays into the nose daily. 1 Inhaler 12  . zolpidem (AMBIEN) 10 MG tablet TAKE 1 TABLET BY MOUTH AT BEDTIME 90 tablet  0  . escitalopram (LEXAPRO) 10 MG tablet Take 1 tablet (10 mg total) by mouth daily. 90 tablet 3   No current facility-administered medications on file prior to visit.    Review of Systems  Constitutional: Negative for other unusual diaphoresis or sweats HENT: Negative for ear discharge or swelling Eyes: Negative for other worsening visual disturbances Respiratory: Negative for stridor or other swelling  Gastrointestinal: Negative for worsening distension or other blood Genitourinary: Negative for retention or other urinary change Musculoskeletal: Negative for other MSK pain or swelling Skin: Negative for color change or other  new lesions Neurological: Negative for worsening tremors and other numbness  Psychiatric/Behavioral: Negative for worsening agitation or other fatigue     Objective:   Physical Exam BP 118/66   Pulse 72   Ht 6' (1.829 m)   Wt 234 lb (106.1 kg)   SpO2 98%   BMI 31.74 kg/m  VS noted,  Constitutional: Pt appears in NAD HENT: Head: NCAT.  Right Ear: External ear normal.  Left Ear: External ear normal.  Eyes: . Pupils are equal, round, and reactive to light. Conjunctivae and EOM are normal Nose: without d/c or deformity Neck: Neck supple. Gross normal ROM Cardiovascular: Normal rate and regular rhythm.   Pulmonary/Chest: Effort normal and breath sounds without rales or wheezing.  Neurological: Pt is alert. At baseline orientation, motor grossly intact Skin: Skin is warm. No rashes, other new lesions, no LE edema Psychiatric: Pt behavior is anxious/depressed without agitation  No other exam findings  Lab Results  Component Value Date   WBC 5.6 03/09/2016   HGB 14.2 03/09/2016   HCT 40.0 03/09/2016   PLT 198.0 03/09/2016   GLUCOSE 129 (H) 07/27/2016   CHOL 132 07/27/2016   TRIG 79.0 07/27/2016   HDL 43.20 07/27/2016   LDLCALC 73 07/27/2016   ALT 14 07/27/2016   AST 13 07/27/2016   NA 137 07/27/2016   K 4.0 07/27/2016   CL 102 07/27/2016   CREATININE 0.89 07/27/2016   BUN 12 07/27/2016   CO2 29 07/27/2016   TSH 1.84 03/09/2016   PSA 2.94 03/09/2016   HGBA1C 6.5 07/27/2016   MICROALBUR 1.0 07/27/2016       Assessment & Plan:

## 2016-09-11 NOTE — Assessment & Plan Note (Signed)
[place] for increased lexapro to [employer]

## 2016-09-11 NOTE — Patient Instructions (Signed)
Ok to increase the lexapro to 20 mg per day  OK to add the abilify at 1/2 of the 5 mg pill in 2-3 wks if not well improved  Please continue all other medications as before, and refills have been done if requested.  Please have the pharmacy call with any other refills you may need.  Please keep your appointments with your specialists as you may have planned

## 2016-09-11 NOTE — Progress Notes (Signed)
Pre visit review using our clinic review tool, if applicable. No additional management support is needed unless otherwise documented below in the visit note. 

## 2016-09-11 NOTE — Assessment & Plan Note (Addendum)
To add 1/2 of 5 mg tab ability in 2-3wks if not improved with the lexapro, delcines referral counseling or psychiatry

## 2016-09-11 NOTE — Assessment & Plan Note (Signed)
Mild, declines med such as concerta, had a reaction prior to adderall

## 2016-09-23 ENCOUNTER — Other Ambulatory Visit: Payer: Self-pay | Admitting: Internal Medicine

## 2016-09-24 NOTE — Telephone Encounter (Signed)
Routing to dr john, please advise, thanks 

## 2016-09-25 NOTE — Telephone Encounter (Signed)
faxed

## 2016-09-25 NOTE — Telephone Encounter (Signed)
Done hardcopy to Shirron  

## 2016-10-23 ENCOUNTER — Telehealth: Payer: Self-pay | Admitting: *Deleted

## 2016-10-23 MED ORDER — FLUOCINONIDE 0.05 % EX CREA: 1.0000 "application " | TOPICAL_CREAM | Freq: Two times a day (BID) | CUTANEOUS | 0 refills | Status: DC

## 2016-10-23 NOTE — Telephone Encounter (Signed)
Ok for refill - done erx °

## 2016-10-23 NOTE — Telephone Encounter (Signed)
Rec'd call pt states MD had given him a rx for Fluocinide cream but his has expired requesting [facility]. Cream not on med list pls advise...Paul Tucker

## 2016-10-23 NOTE — Telephone Encounter (Signed)
Notified pt [facility].../lmb 

## 2017-02-24 ENCOUNTER — Other Ambulatory Visit: Payer: Self-pay | Admitting: Internal Medicine

## 2017-02-25 ENCOUNTER — Telehealth: Payer: Self-pay | Admitting: Internal Medicine

## 2017-02-25 DIAGNOSIS — R739 Hyperglycemia, unspecified: Secondary | ICD-10-CM

## 2017-02-25 DIAGNOSIS — Z0000 Encounter for general adult medical examination without abnormal findings: Secondary | ICD-10-CM

## 2017-02-25 NOTE — Telephone Encounter (Signed)
Patient has been informed.

## 2017-02-25 NOTE — Telephone Encounter (Signed)
Patient states Dr. Toy Care would like for the patient to a thyroid check. Could we add this to his blood work. He has an appointment 10/17.

## 2017-02-25 NOTE — Telephone Encounter (Signed)
Done erx 

## 2017-02-25 NOTE — Telephone Encounter (Signed)
Ok, labs are ordered 

## 2017-02-28 ENCOUNTER — Other Ambulatory Visit (INDEPENDENT_AMBULATORY_CARE_PROVIDER_SITE_OTHER): Payer: 59

## 2017-02-28 DIAGNOSIS — R739 Hyperglycemia, unspecified: Secondary | ICD-10-CM | POA: Diagnosis not present

## 2017-02-28 DIAGNOSIS — Z0000 Encounter for general adult medical examination without abnormal findings: Secondary | ICD-10-CM

## 2017-02-28 LAB — HEPATIC FUNCTION PANEL
ALBUMIN: 4.6 g/dL (ref 3.5–5.2)
ALT: 14 U/L (ref 0–53)
AST: 11 U/L (ref 0–37)
Alkaline Phosphatase: 72 U/L (ref 39–117)
BILIRUBIN TOTAL: 0.7 mg/dL (ref 0.2–1.2)
Bilirubin, Direct: 0.2 mg/dL (ref 0.0–0.3)
Total Protein: 7.2 g/dL (ref 6.0–8.3)

## 2017-02-28 LAB — LIPID PANEL
CHOL/HDL RATIO: 4
CHOLESTEROL: 146 mg/dL (ref 0–200)
HDL: 37.3 mg/dL — ABNORMAL LOW (ref >39.00)
NonHDL: 108.47
TRIGLYCERIDES: 229 mg/dL — AB (ref 0.0–149.0)
VLDL: 45.8 mg/dL — AB (ref 0.0–40.0)

## 2017-02-28 LAB — LDL CHOLESTEROL, DIRECT: Direct LDL: 82 mg/dL

## 2017-02-28 LAB — URINALYSIS, ROUTINE W REFLEX MICROSCOPIC
BILIRUBIN URINE: NEGATIVE
HGB URINE DIPSTICK: NEGATIVE
KETONES UR: NEGATIVE
LEUKOCYTES UA: NEGATIVE
NITRITE: NEGATIVE
PH: 6 (ref 5.0–8.0)
RBC / HPF: NONE SEEN (ref 0–?)
Specific Gravity, Urine: 1.025 (ref 1.000–1.030)
TOTAL PROTEIN, URINE-UPE24: NEGATIVE
UROBILINOGEN UA: 0.2 (ref 0.0–1.0)

## 2017-02-28 LAB — CBC WITH DIFFERENTIAL/PLATELET
BASOS ABS: 0 10*3/uL (ref 0.0–0.1)
BASOS PCT: 0.8 % (ref 0.0–3.0)
EOS ABS: 0.3 10*3/uL (ref 0.0–0.7)
Eosinophils Relative: 4.4 % (ref 0.0–5.0)
HEMATOCRIT: 41.3 % (ref 39.0–52.0)
HEMOGLOBIN: 14.5 g/dL (ref 13.0–17.0)
LYMPHS PCT: 21.8 % (ref 12.0–46.0)
Lymphs Abs: 1.3 10*3/uL (ref 0.7–4.0)
MCHC: 35.1 g/dL (ref 30.0–36.0)
MCV: 96.4 fl (ref 78.0–100.0)
Monocytes Absolute: 0.4 10*3/uL (ref 0.1–1.0)
Monocytes Relative: 7.8 % (ref 3.0–12.0)
Neutro Abs: 3.7 10*3/uL (ref 1.4–7.7)
Neutrophils Relative %: 65.2 % (ref 43.0–77.0)
Platelets: 220 10*3/uL (ref 150.0–400.0)
RBC: 4.28 Mil/uL (ref 4.22–5.81)
RDW: 12.5 % (ref 11.5–15.5)
WBC: 5.8 10*3/uL (ref 4.0–10.5)

## 2017-02-28 LAB — TSH: TSH: 1.42 u[IU]/mL (ref 0.35–4.50)

## 2017-02-28 LAB — BASIC METABOLIC PANEL
BUN: 18 mg/dL (ref 6–23)
CHLORIDE: 100 meq/L (ref 96–112)
CO2: 26 mEq/L (ref 19–32)
CREATININE: 1 mg/dL (ref 0.40–1.50)
Calcium: 8.8 mg/dL (ref 8.4–10.5)
GFR: 79.6 mL/min (ref >60.00)
GLUCOSE: 287 mg/dL — AB (ref 70–99)
Potassium: 4.2 mEq/L (ref 3.5–5.1)
Sodium: 135 mEq/L (ref 135–145)

## 2017-02-28 LAB — PSA: PSA: 3.06 ng/mL (ref 0.10–4.00)

## 2017-02-28 LAB — HEMOGLOBIN A1C: Hgb A1c MFr Bld: 8.4 % — ABNORMAL HIGH (ref 4.6–6.5)

## 2017-03-06 ENCOUNTER — Ambulatory Visit (INDEPENDENT_AMBULATORY_CARE_PROVIDER_SITE_OTHER): Payer: 59 | Admitting: Internal Medicine

## 2017-03-06 ENCOUNTER — Encounter: Payer: Self-pay | Admitting: Internal Medicine

## 2017-03-06 VITALS — BP 136/82 | HR 94 | Temp 98.7°F | Ht 72.0 in | Wt 236.0 lb

## 2017-03-06 DIAGNOSIS — E1140 Type 2 diabetes mellitus with diabetic neuropathy, unspecified: Secondary | ICD-10-CM

## 2017-03-06 DIAGNOSIS — Z114 Encounter for screening for human immunodeficiency virus [HIV]: Secondary | ICD-10-CM

## 2017-03-06 DIAGNOSIS — F329 Major depressive disorder, single episode, unspecified: Secondary | ICD-10-CM

## 2017-03-06 DIAGNOSIS — Z23 Encounter for immunization: Secondary | ICD-10-CM

## 2017-03-06 DIAGNOSIS — F32A Depression, unspecified: Secondary | ICD-10-CM

## 2017-03-06 DIAGNOSIS — Z0000 Encounter for general adult medical examination without abnormal findings: Secondary | ICD-10-CM | POA: Diagnosis not present

## 2017-03-06 DIAGNOSIS — E785 Hyperlipidemia, unspecified: Secondary | ICD-10-CM

## 2017-03-06 MED ORDER — ZOLPIDEM TARTRATE 10 MG PO TABS: 10.0000 mg | ORAL_TABLET | Freq: Every day | ORAL | 1 refills | Status: DC

## 2017-03-06 MED ORDER — LOVASTATIN 40 MG PO TABS: 40.0000 mg | ORAL_TABLET | Freq: Every day | ORAL | 3 refills | Status: DC

## 2017-03-06 MED ORDER — TRIAMCINOLONE ACETONIDE 55 MCG/ACT NA AERO: 2.0000 | INHALATION_SPRAY | Freq: Every day | NASAL | 5 refills | Status: DC

## 2017-03-06 MED ORDER — METFORMIN HCL ER 500 MG PO TB24: 500.0000 mg | ORAL_TABLET | Freq: Every day | ORAL | 3 refills | Status: DC

## 2017-03-06 MED ORDER — TRIAMCINOLONE ACETONIDE 55 MCG/ACT NA AERO: 2.0000 | INHALATION_SPRAY | Freq: Every day | NASAL | 3 refills | Status: DC

## 2017-03-06 MED ORDER — TAMSULOSIN HCL 0.4 MG PO CAPS: 0.4000 mg | ORAL_CAPSULE | Freq: Every day | ORAL | 3 refills | Status: DC

## 2017-03-06 NOTE — Assessment & Plan Note (Signed)
With some evidence for neuropathy, declines podiatry and very reluctant to restart metformin but will need metform in ER 500 qd

## 2017-03-06 NOTE — Patient Instructions (Addendum)
You had the flu shot today  You had the new Prevnar pneumonia shot today; and you would be due fo rthe last Pneumovax in 2 years  Please continue all other medications as before, and refills have been done if requested.  Please have the pharmacy call with any other refills you may need.  Please continue your efforts at being more active, low cholesterol diet, and weight control.  You are otherwise up to date with prevention measures today.  Please keep your appointments with your specialists as you may have planned  Please return in 6 months, or sooner if needed, with Lab testing done 3-5 days before

## 2017-03-06 NOTE — Assessment & Plan Note (Signed)
Cont f/u with psychiatry as planned

## 2017-03-06 NOTE — Progress Notes (Signed)
Subjective:    Patient ID: Paul Tucker, male    DOB: [birth year], 66 y.o.   MRN: [id number]  HPI   Here for wellness and f/u;  Overall doing ok;  Pt denies Chest pain, worsening SOB, DOE, wheezing, orthopnea, PND, worsening LE edema, palpitations, dizziness or syncope.  Pt denies neurological change such as new headache, facial or extremity weakness.  Pt denies polydipsia, polyuria, or low sugar symptoms. Pt states overall good compliance with treatment and medications, good tolerability, and has been trying to follow appropriate diet.  Pt denies worsening depressive symptoms, suicidal ideation or panic - currently seeing psychiatry.. No fever, night sweats, wt loss, loss of appetite, or other constitutional symptoms.  Pt states good ability with ADL's, has low fall risk, home safety reviewed and adequate, no other significant changes in hearing or vision, and not active with exercise.  Has some numbness since used a spade to dig dirt 2 yrs ago to bilat toes.  Not clear if neuropathy but not clearing up. Declines podiatry.  Has been seeing psychiatry and in process of changeover from lexapro to pristiq,50 qd, with planned increase to 100 next month.  Admits to not following diet well and has cont'd to gain wt, vows to be more diligent over next 6 mo Wt Readings from Last 3 Encounters:  03/06/17 236 lb (107 kg)  09/11/16 234 lb (106.1 kg)  07/27/16 230 lb (104.3 kg)   BP Readings from Last 3 Encounters:  03/06/17 136/82  09/11/16 118/66  07/27/16 117/74   Past Medical History:  Diagnosis Date  . Acute prostatitis 04/04/2007  . ADD 01/17/2007  . DEPRESSION 01/17/2007  . Dermatophytosis of groin and perianal area 05/31/2008  . DIVERTICULOSIS, COLON 04/06/2007  . Dysuria 01/27/2009  . FLANK PAIN, LEFT 09/30/2007  . Flushing 08/26/2009  . GERD 01/20/2007  . GLUCOSE INTOLERANCE 04/04/2007  . Gross hematuria 01/27/2009  . HEMATOCHEZIA 07/26/2008  . HYPERLIPIDEMIA 01/20/2007  . HYPOGONADISM 06/13/2009  .  Impaired glucose tolerance 12/10/2010  . INSOMNIA 04/04/2007  . LIBIDO, DECREASED 12/02/2008  . 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 LOW BACK PAIN 01/20/2007  . OBSTRUCTIVE SLEEP APNEA 04/04/2007  . OTITIS MEDIA, ACUTE, BILATERAL 06/13/2009  . PANCREATITIS, HX OF 01/17/2007  . PSA, INCREASED 11/18/2009  . RASH-NONVESICULAR 08/26/2009  . SINUSITIS- ACUTE-NOS 04/04/2007  . SWEATING 09/30/2007  . THRUSH 04/04/2007  . TONSILLECTOMY AND ADENOIDECTOMY, HX OF 01/17/2007  . URI 07/25/2010  . URINARY RETENTION 01/27/2009  . VERTIGO 06/13/2009   Past Surgical History:  Procedure Laterality Date  . CHOLECYSTECTOMY  2005  . HEMORRHOID BANDING  2015  . HERNIA REPAIR  1957  . TONSILLECTOMY    . VASECTOMY  1987    reports that he has never smoked. He has never used smokeless tobacco. He reports that he drinks alcohol. He reports that he does not use drugs. family history includes Coronary artery disease in his other; Diabetes in his other; Emphysema in his father; Heart disease in his father. Allergies  Allergen Reactions  . Pravastatin Sodium     REACTION: nausea  Pt does not recall this reaction   Current Outpatient Prescriptions on File Prior to Visit  Medication Sig Dispense Refill  . aspirin 81 MG EC tablet Take 81 mg by mouth daily.      . Ciclopirox 0.77 % gel USE AS DIRECTED TOPCIALLY 45 g 1  . cyclobenzaprine (FLEXERIL) 5 MG tablet Take 1 tablet (5 mg total) by mouth 3 (three) times daily  as needed for muscle spasms. 40 tablet 1  . fluocinonide cream (LIDEX) 6.57 % Apply 1 application topically 2 (two) times daily. 30 g 0  . ibuprofen (ADVIL,MOTRIN) 200 MG tablet 2-3 by mouth once daily as needed for back pain     . lovastatin (MEVACOR) 40 MG tablet Take 1 tablet (40 mg total) by mouth at bedtime. 90 tablet 3  . meclizine (ANTIVERT) 12.5 MG tablet 1-2 tabs by mouth every 8 hrs as needed for dizziness 90 tablet 0  . tamsulosin (FLOMAX) 0.4 MG CAPS capsule TAKE 1 CAPSULE BY MOUTH  DAILY 90  capsule 1   No current facility-administered medications on file prior to visit.    Review of Systems Constitutional: Negative for other unusual diaphoresis, sweats, appetite or weight changes HENT: Negative for other worsening hearing loss, ear pain, facial swelling, mouth sores or neck stiffness.   Eyes: Negative for other worsening pain, redness or other visual disturbance.  Respiratory: Negative for other stridor or swelling Cardiovascular: Negative for other palpitations or other chest pain  Gastrointestinal: Negative for worsening diarrhea or loose stools, blood in stool, distention or other pain Genitourinary: Negative for hematuria, flank pain or other change in urine volume.  Musculoskeletal: Negative for myalgias or other joint swelling.  Skin: Negative for other color change, or other wound or worsening drainage.  Neurological: Negative for other syncope or numbness. Hematological: Negative for other adenopathy or swelling Psychiatric/Behavioral: Negative for hallucinations, other worsening agitation, SI, self-injury, or new decreased concentration All other system neg per pt    Objective:   Physical Exam BP 136/82   Pulse 94   Temp 98.7 F (37.1 C) (Oral)   Ht 6' (1.829 m)   Wt 236 lb (107 kg)   SpO2 98%   BMI 32.01 kg/m  VS noted,  Constitutional: Pt is oriented to person, place, and time. Appears well-developed and well-nourished, in no significant distress and comfortable Head: Normocephalic and atraumatic  Eyes: Conjunctivae and EOM are normal. Pupils are equal, round, and reactive to light Right Ear: External ear normal without discharge Left Ear: External ear normal without discharge Nose: Nose without discharge or deformity Mouth/Throat: Oropharynx is without other ulcerations and moist  Neck: Normal range of motion. Neck supple. No JVD present. No tracheal deviation present or significant neck LA or mass Cardiovascular: Normal rate, regular rhythm, normal  heart sounds and intact distal pulses.   Pulmonary/Chest: WOB normal and breath sounds without rales or wheezing  Abdominal: Soft. Bowel sounds are normal. NT. No HSM  Musculoskeletal: Normal range of motion. Exhibits no edema Lymphadenopathy: Has no other cervical adenopathy.  Neurological: Pt is alert and oriented to person, place, and time. Pt has normal reflexes. No cranial nerve deficit. Motor grossly intact, Gait intact, some decreased sens to LT to toes bilat o/w neurovasc intact Skin: Skin is warm and dry. No rash noted or new ulcerations Psychiatric:  Has normal mood and affect. Behavior is normal without agitation No other exam findings Lab Results  Component Value Date   WBC 5.8 02/28/2017   HGB 14.5 02/28/2017   HCT 41.3 02/28/2017   PLT 220.0 02/28/2017   GLUCOSE 287 (H) 02/28/2017   CHOL 146 02/28/2017   TRIG 229.0 (H) 02/28/2017   HDL 37.30 (L) 02/28/2017   LDLDIRECT 82.0 02/28/2017   LDLCALC 73 07/27/2016   ALT 14 02/28/2017   AST 11 02/28/2017   NA 135 02/28/2017   K 4.2 02/28/2017   CL 100 02/28/2017   CREATININE  1.00 02/28/2017   BUN 18 02/28/2017   CO2 26 02/28/2017   TSH 1.42 02/28/2017   PSA 3.06 02/28/2017   HGBA1C 8.4 (H) 02/28/2017   MICROALBUR 1.0 07/27/2016       Assessment & Plan:

## 2017-03-06 NOTE — Assessment & Plan Note (Signed)

## 2017-03-06 NOTE — Assessment & Plan Note (Signed)
Goal ldl < 70, declines change in statin such as crestor today, to work on DM diet

## 2017-03-13 ENCOUNTER — Ambulatory Visit: Payer: 59 | Admitting: Internal Medicine

## 2017-03-31 ENCOUNTER — Other Ambulatory Visit: Payer: Self-pay | Admitting: Internal Medicine

## 2017-04-01 NOTE — Telephone Encounter (Signed)
Paul Tucker too soon, as last rx was just done oct 2018

## 2017-04-25 ENCOUNTER — Encounter: Payer: Self-pay | Admitting: Family Medicine

## 2017-04-25 ENCOUNTER — Encounter: Payer: Self-pay | Admitting: Emergency Medicine

## 2017-04-25 ENCOUNTER — Ambulatory Visit: Payer: 59 | Admitting: Family Medicine

## 2017-04-25 ENCOUNTER — Ambulatory Visit: Payer: 59 | Admitting: Internal Medicine

## 2017-04-25 VITALS — BP 150/80 | HR 95 | Temp 97.9°F | Wt 229.5 lb

## 2017-04-25 DIAGNOSIS — E860 Dehydration: Secondary | ICD-10-CM

## 2017-04-25 DIAGNOSIS — B349 Viral infection, unspecified: Secondary | ICD-10-CM

## 2017-04-25 NOTE — Progress Notes (Signed)
Subjective:    Patient ID: Paul Tucker, male    DOB: 1951-05-17, 66 y.o.   MRN: [id number]  No chief complaint on file. Patient is accompanied by his wife.  HPI Patient was seen today for acute concern.  Muscle aches and chills started on Friday.  Patient endorses an episode of vomiting on Saturday with decreased energy and little to no appetite.  Patient also endorsed headache with high soreness over the weekend.  Patient also endorses nasal drainage for which she is taking Zyrtec.  Patient denies sore throat, cough, diarrhea, fever, rhinorrhea.  Patient denies sick contacts.     Pt has a h/o pancreatitis.  He is s/p cholecystectomy. Past Medical History:  Diagnosis Date  . Acute prostatitis 04/04/2007  . ADD 01/17/2007  . DEPRESSION 01/17/2007  . Dermatophytosis of groin and perianal area 05/31/2008  . DIVERTICULOSIS, COLON 04/06/2007  . Dysuria 01/27/2009  . FLANK PAIN, LEFT 09/30/2007  . Flushing 08/26/2009  . GERD 01/20/2007  . GLUCOSE INTOLERANCE 04/04/2007  . Gross hematuria 01/27/2009  . HEMATOCHEZIA 07/26/2008  . HYPERLIPIDEMIA 01/20/2007  . HYPOGONADISM 06/13/2009  . Impaired glucose tolerance 12/10/2010  . INSOMNIA 04/04/2007  . LIBIDO, DECREASED 12/02/2008  . 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 LOW BACK PAIN 01/20/2007  . OBSTRUCTIVE SLEEP APNEA 04/04/2007  . OTITIS MEDIA, ACUTE, BILATERAL 06/13/2009  . PANCREATITIS, HX OF 01/17/2007  . PSA, INCREASED 11/18/2009  . RASH-NONVESICULAR 08/26/2009  . SINUSITIS- ACUTE-NOS 04/04/2007  . SWEATING 09/30/2007  . THRUSH 04/04/2007  . TONSILLECTOMY AND ADENOIDECTOMY, HX OF 01/17/2007  . URI 07/25/2010  . URINARY RETENTION 01/27/2009  . VERTIGO 06/13/2009    Allergies  Allergen Reactions  . Pravastatin Sodium     REACTION: nausea  Pt does not recall this reaction    ROS General: Denies fever, chills, night sweats, changes in weight, changes in appetite  +fatigue, chills, body aches. HEENT: Denies headaches, ear pain, changes in vision,  rhinorrhea, sore throat CV: Denies CP, palpitations, SOB, orthopnea Pulm: Denies SOB, cough, wheezing GI: Denies abdominal pain, nausea, diarrhea, constipation  +emesis x1 GU: Denies dysuria, hematuria, frequency, vaginal discharge Msk: Denies muscle cramps, joint pains Neuro: Denies weakness, numbness, tingling Skin: Denies rashes, bruising Psych: Denies depression, anxiety, hallucinations     Objective:    Blood pressure (!) 150/80, pulse 95, temperature 97.9 F (36.6 C), temperature source Oral, weight 229 lb 8 oz (104.1 kg), SpO2 98 %.   Gen. Pleasant, appears sick but nontoxic, well-nourished, in no distress HEENT: [place]/AT, face symmetric, oral mucosa dry appearing, lips cracked, peeling.  No scleral icterus, PERRLA, nares patent without drainage, pharynx with mild postnasal drainage and erythema, no exudate. Lungs: no accessory muscle use, CTAB, no wheezes or rales Cardiovascular: RRR, no m/r/g, no peripheral edema Abdomen: BS present, soft, mildly TTP in RUQ, no hepatosplenomegaly. Neuro:  A&Ox3, CN II-XII intact, normal gait Skin:  Warm, no lesions/ rash   Wt Readings from Last 3 Encounters:  04/25/17 229 lb 8 oz (104.1 kg)  03/06/17 236 lb (107 kg)  09/11/16 234 lb (106.1 kg)    Lab Results  Component Value Date   WBC 5.8 02/28/2017   HGB 14.5 02/28/2017   HCT 41.3 02/28/2017   PLT 220.0 02/28/2017   GLUCOSE 287 (H) 02/28/2017   CHOL 146 02/28/2017   TRIG 229.0 (H) 02/28/2017   HDL 37.30 (L) 02/28/2017   LDLDIRECT 82.0 02/28/2017   LDLCALC 73 07/27/2016   ALT 14 02/28/2017   AST 11 02/28/2017  NA 135 02/28/2017   K 4.2 02/28/2017   CL 100 02/28/2017   CREATININE 1.00 02/28/2017   BUN 18 02/28/2017   CO2 26 02/28/2017   TSH 1.42 02/28/2017   PSA 3.06 02/28/2017   HGBA1C 8.4 (H) 02/28/2017   MICROALBUR 1.0 07/27/2016    Assessment/Plan:  Viral illness -supportive care -given excuse note for work -given RTC precautions  Dehydration -discussed  rehydration -given handout -given RTC/ ED precautions    F/u prn in the next few days if becoming worse.

## 2017-04-25 NOTE — Patient Instructions (Addendum)
Viral Illness, Adult Viruses are tiny germs that can get into a person's body and cause illness. There are many different types of viruses, and they cause many types of illness. Viral illnesses can range from mild to severe. They can affect various parts of the body. Common illnesses that are caused by a virus include colds and the flu. Viral illnesses also include serious conditions such as HIV/AIDS (human immunodeficiency virus/acquired immunodeficiency syndrome). A few viruses have been linked to certain cancers. What are the causes? Many types of viruses can cause illness. Viruses invade cells in your body, multiply, and cause the infected cells to malfunction or die. When the cell dies, it releases more of the virus. When this happens, you develop symptoms of the illness, and the virus continues to spread to other cells. If the virus takes over the function of the cell, it can cause the cell to divide and grow out of control, as is the case when a virus causes cancer. Different viruses get into the body in different ways. You can get a virus by:  Swallowing food or water that is contaminated with the virus.  Breathing in droplets that have been coughed or sneezed into the air by an infected person.  Touching a surface that has been contaminated with the virus and then touching your eyes, nose, or mouth.  Being bitten by an insect or animal that carries the virus.  Having sexual contact with a person who is infected with the virus.  Being exposed to blood or fluids that contain the virus, either through an open cut or during a transfusion.  If a virus enters your body, your body's defense system (immune system) will try to fight the virus. You may be at higher risk for a viral illness if your immune system is weak. What are the signs or symptoms? Symptoms vary depending on the type of virus and the location of the cells that it invades. Common symptoms of the main types of viral illnesses  include: Cold and flu viruses  Fever.  Headache.  Sore throat.  Muscle aches.  Nasal congestion.  Cough. Digestive system (gastrointestinal) viruses  Fever.  Abdominal pain.  Nausea.  Diarrhea. Liver viruses (hepatitis)  Loss of appetite.  Tiredness.  Yellowing of the skin (jaundice). Brain and spinal cord viruses  Fever.  Headache.  Stiff neck.  Nausea and vomiting.  Confusion or sleepiness. Skin viruses  Warts.  Itching.  Rash. Sexually transmitted viruses  Discharge.  Swelling.  Redness.  Rash. How is this treated? Viruses can be difficult to treat because they live within cells. Antibiotic medicines do not treat viruses because these drugs do not get inside cells. Treatment for a viral illness may include:  Resting and drinking plenty of fluids.  Medicines to relieve symptoms. These can include over-the-counter medicine for pain and fever, medicines for cough or congestion, and medicines to relieve diarrhea.  Antiviral medicines. These drugs are available only for certain types of viruses. They may help reduce flu symptoms if taken early. There are also many antiviral medicines for hepatitis and HIV/AIDS.  Some viral illnesses can be prevented with vaccinations. A common example is the flu shot. Follow these instructions at home: Medicines   Take over-the-counter and prescription medicines only as told by your health care provider.  If you were prescribed an antiviral medicine, take it as told by your health care provider. Do not stop taking the medicine even if you start to feel better.  Be aware   of when antibiotics are needed and when they are not needed. Antibiotics do not treat viruses. If your health care provider thinks that you may have a bacterial infection as well as a viral infection, you may get an antibiotic. ? Do not ask for an antibiotic prescription if you have been diagnosed with a viral illness. That will not make your  illness go away faster. ? Frequently taking antibiotics when they are not needed can lead to antibiotic resistance. When this develops, the medicine no longer works against the bacteria that it normally fights. General instructions  Drink enough fluids to keep your urine clear or pale yellow.  Rest as much as possible.  Return to your normal activities as told by your health care provider. Ask your health care provider what activities are safe for you.  Keep all follow-up visits as told by your health care provider. This is important. How is this prevented? Take these actions to reduce your risk of viral infection:  Eat a healthy diet and get enough rest.  Wash your hands often with soap and water. This is especially important when you are in public places. If soap and water are not available, use hand sanitizer.  Avoid close contact with friends and family who have a viral illness.  If you travel to areas where viral gastrointestinal infection is common, avoid drinking water or eating raw food.  Keep your immunizations up to date. Get a flu shot every year as told by your health care provider.  Do not share toothbrushes, nail clippers, razors, or needles with other people.  Always practice safe sex.  Contact a health care provider if:  You have symptoms of a viral illness that do not go away.  Your symptoms come back after going away.  Your symptoms get worse. Get help right away if:  You have trouble breathing.  You have a severe headache or a stiff neck.  You have severe vomiting or abdominal pain. This information is not intended to replace advice given to you by your health care provider. Make sure you discuss any questions you have with your health care provider. Document Released: 09/16/2015 Document Revised: 10/19/2015 Document Reviewed: 09/16/2015 Elsevier Interactive Patient Education  2018 [employer].  Dehydration, Adult Dehydration is when there is not  enough fluid or water in your body. This happens when you lose more fluids than you take in. Dehydration can range from mild to very bad. It should be treated right away to keep it from getting very bad. Symptoms of mild dehydration may include:  Thirst.  Dry lips.  Slightly dry mouth.  Dry, warm skin.  Dizziness. Symptoms of moderate dehydration may include:  Very dry mouth.  Muscle cramps.  Dark pee (urine). Pee may be the color of tea.  Your body making less pee.  Your eyes making fewer tears.  Heartbeat that is uneven or faster than normal (palpitations).  Headache.  Light-headedness, especially when you stand up from sitting.  Fainting (syncope). Symptoms of very bad dehydration may include:  Changes in skin, such as: ? Cold and clammy skin. ? Blotchy (mottled) or pale skin. ? Skin that does not quickly return to normal after being lightly pinched and let go (poor skin turgor).  Changes in body fluids, such as: ? Feeling very thirsty. ? Your eyes making fewer tears. ? Not sweating when body temperature is high, such as in hot weather. ? Your body making very little pee.  Changes in vital signs, such  as: ? Weak pulse. ? Pulse that is more than 100 beats a minute when you are sitting still. ? Fast breathing. ? Low blood pressure.  Other changes, such as: ? Sunken eyes. ? Cold hands and feet. ? Confusion. ? Lack of energy (lethargy). ? Trouble waking up from sleep. ? Short-term weight loss. ? Unconsciousness. Follow these instructions at home:  If told by your doctor, drink an ORS: ? Make an ORS by using instructions on the package. ? Start by drinking small amounts, about  cup (120 mL) every 5-10 minutes. ? Slowly drink more until you have had the amount that your doctor said to have.  Drink enough clear fluid to keep your pee clear or pale yellow. If you were told to drink an ORS, finish the ORS first, then start slowly drinking clear fluids.  Drink fluids such as: ? Water. Do not drink only water by itself. Doing that can make the salt (sodium) level in your body get too low (hyponatremia). ? Ice chips. ? Fruit juice that you have added water to (diluted). ? Low-calorie sports drinks.  Avoid: ? Alcohol. ? Drinks that have a lot of sugar. These include high-calorie sports drinks, fruit juice that does not have water added, and soda. ? Caffeine. ? Foods that are greasy or have a lot of fat or sugar.  Take over-the-counter and prescription medicines only as told by your doctor.  Do not take salt tablets. Doing that can make the salt level in your body get too high (hypernatremia).  Eat foods that have minerals (electrolytes). Examples include bananas, oranges, potatoes, tomatoes, and spinach.  Keep all follow-up visits as told by your doctor. This is important. Contact a doctor if:  You have belly (abdominal) pain that: ? Gets worse. ? Stays in one area (localizes).  You have a rash.  You have a stiff neck.  You get angry or annoyed more easily than normal (irritability).  You are more sleepy than normal.  You have a harder time waking up than normal.  You feel: ? Weak. ? Dizzy. ? Very thirsty.  You have peed (urinated) only a small amount of very dark pee during 6-8 hours. Get help right away if:  You have symptoms of very bad dehydration.  You cannot drink fluids without throwing up (vomiting).  Your symptoms get worse with treatment.  You have a fever.  You have a very bad headache.  You are throwing up or having watery poop (diarrhea) and it: ? Gets worse. ? Does not go away.  You have blood or something green (bile) in your throw-up.  You have blood in your poop (stool). This may cause poop to look black and tarry.  You have not peed in 6-8 hours.  You pass out (faint).  Your heart rate when you are sitting still is more than 100 beats a minute.  You have trouble breathing. This  information is not intended to replace advice given to you by your health care provider. Make sure you discuss any questions you have with your health care provider. Document Released: 03/03/2009 Document Revised: 11/25/2015 Document Reviewed: 07/01/2015 Elsevier Interactive Patient Education  2018 [employer].  Rehydration, Adult Rehydration is the replacement of body fluids and salts and minerals (electrolytes) that are lost during dehydration. Dehydration is when there is not enough fluid or water in the body. This happens when you lose more fluids than you take in. Common causes of dehydration include:  Vomiting.  Diarrhea.  Excessive sweating, such as from heat exposure or exercise.  Taking medicines that cause the body to lose excess fluid (diuretics).  Impaired kidney function.  Not drinking enough fluid.  Certain illnesses or infections.  Certain poorly controlled [place]-term (chronic) illnesses, such as diabetes, heart disease, and kidney disease.  Symptoms of mild dehydration may include thirst, dry lips and mouth, dry skin, and dizziness. Symptoms of severe dehydration may include increased heart rate, confusion, fainting, and not urinating. You can rehydrate by drinking certain fluids or getting fluids through an IV tube, as told by your health care provider. What are the risks? Generally, rehydration is safe. However, one problem that can happen is taking in too much fluid (overhydration). This is rare. If overhydration happens, it can cause an electrolyte imbalance, kidney failure, or a decrease in salt (sodium) levels in the body. How to rehydrate Follow instructions from your health care provider for rehydration. The kind of fluid you should drink and the amount you should drink depend on your condition.  If directed by your health care provider, drink an oral rehydration solution (ORS). This is a drink designed to treat dehydration that is found in pharmacies and retail  stores. ? Make an ORS by following instructions on the package. ? Start by drinking small amounts, about  cup (120 mL) every 5-10 minutes. ? Slowly increase how much you drink until you have taken the amount recommended by your health care provider.  Drink enough clear fluids to keep your urine clear or pale yellow. If you were instructed to drink an ORS, finish the ORS first, then start slowly drinking other clear fluids. Drink fluids such as: ? Water. Do not drink only water. Doing that can lead to having too little sodium in your body (hyponatremia). ? Ice chips. ? Fruit juice that you have added water to (diluted juice). ? Low-calorie sports drinks.  If you are severely dehydrated, your health care provider may recommend that you receive fluids through an IV tube in the hospital.  Do not take sodium tablets. Doing that can lead to the condition of having too much sodium in your body (hypernatremia).  Eating while you rehydrate Follow instructions from your health care provider about what to eat while you rehydrate. Your health care provider may recommend that you slowly begin eating regular foods in small amounts.  Eat foods that contain a healthy balance of electrolytes, such as bananas, oranges, potatoes, tomatoes, and spinach.  Avoid foods that are greasy or contain a lot of fat or sugar.  In some cases, you may get nutrition through a feeding tube that is passed through your nose and into your stomach (nasogastric tube, or NG tube). This may be done if you have uncontrolled vomiting or diarrhea. Beverages to avoid Certain beverages may make dehydration worse. While you rehydrate, avoid:  Alcohol.  Caffeine.  Drinks that contain a lot of sugar. These include: ? High-calorie sports drinks. ? Fruit juice that is not diluted. ? Soda.  Check nutrition labels to see how much sugar or caffeine a beverage contains. Signs of dehydration recovery You may be recovering from  dehydration if:  You are urinating more often than before you started rehydrating.  Your urine is clear or pale yellow.  Your energy level improves.  You vomit less frequently.  You have diarrhea less frequently.  Your appetite improves or returns to normal.  You feel less dizzy or less light-headed.  Your skin tone and color start to look more  normal.  Contact a health care provider if:  You continue to have symptoms of mild dehydration, such as: ? Thirst. ? Dry lips. ? Slightly dry mouth. ? Dry, warm skin. ? Dizziness.  You continue to vomit or have diarrhea. Get help right away if:  You have symptoms of dehydration that get worse.  You feel: ? Confused. ? Weak. ? Like you are going to faint.  You have not urinated in 6-8 hours.  You have very dark urine.  You have trouble breathing.  Your heart rate while sitting still is over 100 beats a minute.  You cannot drink fluids without vomiting.  You have vomiting or diarrhea that: ? Gets worse. ? Does not go away.  You have a fever. This information is not intended to replace advice given to you by your health care provider. Make sure you discuss any questions you have with your health care provider. Document Released: 07/30/2011 Document Revised: 11/25/2015 Document Reviewed: 07/01/2015 Elsevier Interactive Patient Education  [employer].

## 2017-05-31 ENCOUNTER — Encounter: Payer: Self-pay | Admitting: Family Medicine

## 2017-05-31 ENCOUNTER — Ambulatory Visit (INDEPENDENT_AMBULATORY_CARE_PROVIDER_SITE_OTHER): Payer: PPO | Admitting: Family Medicine

## 2017-05-31 VITALS — BP 122/76 | HR 83 | Temp 98.0°F | Resp 16 | Ht 73.0 in | Wt 235.8 lb

## 2017-05-31 DIAGNOSIS — R05 Cough: Secondary | ICD-10-CM | POA: Diagnosis not present

## 2017-05-31 DIAGNOSIS — R059 Cough, unspecified: Secondary | ICD-10-CM

## 2017-05-31 MED ORDER — IPRATROPIUM BROMIDE 0.06 % NA SOLN: 2.0000 | Freq: Four times a day (QID) | NASAL | 0 refills | Status: DC

## 2017-05-31 MED ORDER — BENZONATATE 200 MG PO CAPS: 200.0000 mg | ORAL_CAPSULE | Freq: Two times a day (BID) | ORAL | 0 refills | Status: DC | PRN

## 2017-05-31 MED ORDER — AZITHROMYCIN 250 MG PO TABS: ORAL_TABLET | ORAL | 0 refills | Status: DC

## 2017-05-31 NOTE — Progress Notes (Signed)
   Subjective:  Paul Tucker is a 67 y.o. male who presents today with a chief complaint of cough.   HPI:  Cough, Acute Issue Started about a week ago. Worsened over that time. Associated with some rhinorrhea and sore throat. No fevers or chills.  Has tried taking Zyrtec which is not significantly seem to help.  Also tried Benadryl which has not helped.  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  Symptoms are worse at night.  Cough will wake him up at night.  No other obvious alleviating or aggravating factors.  ROS: Per HPI  PMH: He reports that  has never smoked. he has never used smokeless tobacco. He reports that he drinks alcohol. He reports that he does not use drugs.  Objective:  Physical Exam: BP 122/76 (BP Location: Left Arm, Patient Position: Sitting, Cuff Size: Normal)   Pulse 83   Temp 98 F (36.7 C) (Oral)   Resp 16   Ht 6\' 1"  (1.854 m)   Wt 235 lb 12.8 oz (107 kg)   SpO2 96%   BMI 31.11 kg/m   Gen: NAD, resting comfortably HEENT: TMs are clear effusion bilaterally.  Maxillary sinus with mildly decreased transillumination bilaterally.  Oropharynx erythematous without exudate.  No lymphadenopathy. CV: RRR with no murmurs appreciated Pulm: NWOB, CTAB with no crackles, wheezes, or rhonchi  Assessment/Plan:  Cough Likely secondary to viral URI. No signs of bacterial infection. Start atrovent for rhinorrhea/sinus congestion. Start tessalon for cough. Sent in a "pocket prescription" for azithromycin with strict instruction to not start unless symptoms worse or fail to improve within the next several days. Encouraged good oral hydration. Return precautions reviewed. Follow up as needed.   Algis Greenhouse. Jerline Pain, MD 05/31/2017 4:29 PM

## 2017-05-31 NOTE — Patient Instructions (Signed)
Start the atrovent.  Start tessalon for your cough.  Start the zpack if your symptoms worsen or do not improve in a few days.  Please stay well hydrated.  You can take tylenol and/or motrin as needed for low grade fever and pain.  Please let me know if your symptoms worsen or fail to improve.  Take care, Dr Parker  

## 2017-06-03 ENCOUNTER — Ambulatory Visit: Payer: PPO | Admitting: Family

## 2017-06-04 ENCOUNTER — Ambulatory Visit (INDEPENDENT_AMBULATORY_CARE_PROVIDER_SITE_OTHER): Payer: PPO | Admitting: Internal Medicine

## 2017-06-04 ENCOUNTER — Telehealth: Payer: Self-pay

## 2017-06-04 ENCOUNTER — Encounter: Payer: Self-pay | Admitting: Internal Medicine

## 2017-06-04 VITALS — BP 132/82 | HR 93 | Temp 98.9°F | Ht 73.0 in | Wt 222.0 lb

## 2017-06-04 DIAGNOSIS — J069 Acute upper respiratory infection, unspecified: Secondary | ICD-10-CM | POA: Diagnosis not present

## 2017-06-04 DIAGNOSIS — E1140 Type 2 diabetes mellitus with diabetic neuropathy, unspecified: Secondary | ICD-10-CM | POA: Diagnosis not present

## 2017-06-04 DIAGNOSIS — F419 Anxiety disorder, unspecified: Secondary | ICD-10-CM | POA: Diagnosis not present

## 2017-06-04 MED ORDER — LEVOFLOXACIN 500 MG PO TABS: 500.0000 mg | ORAL_TABLET | Freq: Every day | ORAL | 0 refills | Status: AC

## 2017-06-04 MED ORDER — HYDROCODONE-HOMATROPINE 5-1.5 MG/5ML PO SYRP: 5.0000 mL | ORAL_SOLUTION | Freq: Four times a day (QID) | ORAL | 0 refills | Status: AC | PRN

## 2017-06-04 NOTE — Telephone Encounter (Signed)
-----   Message from Biagio Borg, MD sent at 06/04/2017  2:10 PM EST ----- [place] for this, thanks ----- Message ----- From: Juliet Rude, CMA Sent: 06/04/2017   1:34 PM To: Biagio Borg, MD  Is it ok for pt to take Aleve in addition to the meds prescribed today? I will inform pt via phone call.

## 2017-06-04 NOTE — Assessment & Plan Note (Signed)
stable overall by history and exam, recent data reviewed with pt, and pt to continue medical treatment as before,  to f/u any worsening symptoms or concerns Lab Results  Component Value Date   HGBA1C 8.4 (H) 02/28/2017

## 2017-06-04 NOTE — Assessment & Plan Note (Signed)
Mild to mod, for antibx course,  to f/u any worsening symptoms or concerns 

## 2017-06-04 NOTE — Patient Instructions (Addendum)
OK to stop the zpack  Please take all new medication as prescribed  - the Levaquin antibiotic and cough medicine  Please continue all other medications as before, and refills have been done if requested.  Please have the pharmacy call with any other refills you may need.  Please continue your efforts at being more active, low cholesterol diet, and weight control  Please keep your appointments with your specialists as you may have planned

## 2017-06-04 NOTE — Progress Notes (Signed)
Subjective:    Patient ID: Paul Tucker, male    DOB: January 02, 1951, 67 y.o.   MRN: [id number]  HPI    Here with -6 days acute onset fever to 102, facial pain, pressure, headache, general weakness and malaise, and nasal d/c, without ST but with marked increased scant prod cough, but pt denies chest pain, wheezing, increased sob or doe, orthopnea, PND, increased LE swelling, palpitations, dizziness or syncope, except for worsening wheezing last few days with expirations..  Seen and tx Jan 11 with tx with zpack and tessalon perle and atrovent nasal but does not feel better and the atrovent seemed to cause worsening throat swelling and pain, when this has not happened prior to the med.  C/o low appetite  Started on pristiq per Dr Toy Care and helping mildly per pt, though has ahd some nasuea, dizzy with higher doses, now back to 50 mg.  Plans to f/u there.  No other hx or interval change/.   Pt denies polydipsia, polyuria Past Medical History:  Diagnosis Date  . Acute prostatitis 04/04/2007  . ADD 01/17/2007  . DEPRESSION 01/17/2007  . Dermatophytosis of groin and perianal area 05/31/2008  . DIVERTICULOSIS, COLON 04/06/2007  . Dysuria 01/27/2009  . FLANK PAIN, LEFT 09/30/2007  . Flushing 08/26/2009  . GERD 01/20/2007  . GLUCOSE INTOLERANCE 04/04/2007  . Gross hematuria 01/27/2009  . HEMATOCHEZIA 07/26/2008  . HYPERLIPIDEMIA 01/20/2007  . HYPOGONADISM 06/13/2009  . Impaired glucose tolerance 12/10/2010  . INSOMNIA 04/04/2007  . LIBIDO, DECREASED 12/02/2008  . 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 LOW BACK PAIN 01/20/2007  . OBSTRUCTIVE SLEEP APNEA 04/04/2007  . OTITIS MEDIA, ACUTE, BILATERAL 06/13/2009  . PANCREATITIS, HX OF 01/17/2007  . PSA, INCREASED 11/18/2009  . RASH-NONVESICULAR 08/26/2009  . SINUSITIS- ACUTE-NOS 04/04/2007  . SWEATING 09/30/2007  . THRUSH 04/04/2007  . TONSILLECTOMY AND ADENOIDECTOMY, HX OF 01/17/2007  . URI 07/25/2010  . URINARY RETENTION 01/27/2009  . VERTIGO 06/13/2009   Past Surgical History:   Procedure Laterality Date  . CHOLECYSTECTOMY  2005  . HEMORRHOID BANDING  2015  . HERNIA REPAIR  1957  . TONSILLECTOMY    . VASECTOMY  1987    reports that  has never smoked. he has never used smokeless tobacco. He reports that he drinks alcohol. He reports that he does not use drugs. family history includes Coronary artery disease in his other; Diabetes in his other; Emphysema in his father; Heart disease in his father. Allergies  Allergen Reactions  . Atrovent [Ipratropium] Other (See Comments)    Throat swelling  . Pravastatin Sodium     REACTION: nausea  Pt does not recall this reaction   Current Outpatient Medications on File Prior to Visit  Medication Sig Dispense Refill  . aspirin 81 MG EC tablet Take 81 mg by mouth daily.      Marland Kitchen azithromycin (ZITHROMAX) 250 MG tablet Take 2 tabs day 1, then 1 tab daily 6 each 0  . benzonatate (TESSALON) 200 MG capsule Take 1 capsule (200 mg total) by mouth 2 (two) times daily as needed for cough. 20 capsule 0  . cetirizine (ZYRTEC) 10 MG tablet Take 10 mg by mouth daily.    . Ciclopirox 0.77 % gel USE AS DIRECTED TOPCIALLY 45 g 1  . cyclobenzaprine (FLEXERIL) 5 MG tablet Take 1 tablet (5 mg total) by mouth 3 (three) times daily as needed for muscle spasms. 40 tablet 1  . desvenlafaxine (PRISTIQ) 50 MG 24 hr tablet Take 50 mg  by mouth daily.    . fluocinonide cream (LIDEX) 4.25 % Apply 1 application topically 2 (two) times daily. 30 g 0  . ibuprofen (ADVIL,MOTRIN) 200 MG tablet 2-3 by mouth once daily as needed for back pain     . lovastatin (MEVACOR) 40 MG tablet Take 1 tablet (40 mg total) by mouth at bedtime. 90 tablet 3  . meclizine (ANTIVERT) 12.5 MG tablet 1-2 tabs by mouth every 8 hrs as needed for dizziness 90 tablet 0  . metFORMIN (GLUCOPHAGE-XR) 500 MG 24 hr tablet Take 1 tablet (500 mg total) by mouth daily with breakfast. 90 tablet 3  . tamsulosin (FLOMAX) 0.4 MG CAPS capsule Take 1 capsule (0.4 mg total) by mouth daily. 90  capsule 3  . triamcinolone (NASACORT AQ) 55 MCG/ACT AERO nasal inhaler Place 2 sprays into the nose daily. 3 Inhaler 3  . zolpidem (AMBIEN) 10 MG tablet Take 1 tablet (10 mg total) by mouth at bedtime. 90 tablet 1   No current facility-administered medications on file prior to visit.    ROS:   Constitutional: Negative for other unusual diaphoresis or sweats HENT: Negative for ear discharge or swelling Eyes: Negative for other worsening visual disturbances Respiratory: Negative for stridor or other swelling  Gastrointestinal: Negative for worsening distension or other blood Genitourinary: Negative for retention or other urinary change Musculoskeletal: Negative for other MSK pain or swelling Skin: Negative for color change or other new lesions Neurological: Negative for worsening tremors and other numbness  Psychiatric/Behavioral: Negative for worsening agitation or other fatigue All other system neg per pt    Objective:   Physical Exam BP 132/82   Pulse 93   Temp 98.9 F (37.2 C) (Oral)   Ht 6\' 1"  (1.854 m)   Wt 222 lb (100.7 kg)   SpO2 98%   BMI 29.29 kg/m  VS noted, mild ill Constitutional: Pt appears in NAD HENT: Head: NCAT.  Right Ear: External ear normal.  Left Ear: External ear normal.  Eyes: . Pupils are equal, round, and reactive to light. Conjunctivae and EOM are normal Bilat tm's with mild erythema.  Max sinus areas non tender.  Pharynx with mild erythema, no exudate Nose: without d/c or deformity Neck: Neck supple. Gross normal ROM Cardiovascular: Normal rate and regular rhythm.   Pulmonary/Chest: Effort normal and breath sounds decreased without rales or wheezing.  Abd:  Soft, NT, ND, + BS, no organomegaly Neurological: Pt is alert. At baseline orientation, motor grossly intact Skin: Skin is warm. No rashes, other new lesions, no LE edema Psychiatric: Pt behavior is normal without agitation , 1+ nervous       Assessment & Plan:

## 2017-06-04 NOTE — Telephone Encounter (Signed)
Pt has been notified.

## 2017-06-04 NOTE — Assessment & Plan Note (Signed)
Overall stable, to f/u with psychiatry as planned

## 2017-06-05 ENCOUNTER — Telehealth: Payer: Self-pay | Admitting: Internal Medicine

## 2017-06-05 MED ORDER — ZOLPIDEM TARTRATE 10 MG PO TABS: 10.0000 mg | ORAL_TABLET | Freq: Every evening | ORAL | 1 refills | Status: DC | PRN

## 2017-06-05 NOTE — Telephone Encounter (Signed)
Updated mail service. [facility] faxed rx to [facility].Marland KitchenJohny Chess

## 2017-06-05 NOTE — Telephone Encounter (Signed)
Pt's wife has been informed that paperwork was received.

## 2017-06-05 NOTE — Telephone Encounter (Signed)
Copied from [place] [id number]. Topic: Inquiry >> Jun 05, 2017  9:24 AM Corie Chiquito, [place] wrote: Reason for CRM: Patients wife called to check the status of some paper work that was faxed over to the office for her husband. Stated that it was a prior authorization for Zolpidem. If someone could give her a call back about this at [phone]

## 2017-06-05 NOTE — Telephone Encounter (Signed)
PA has been approved  06/05/17-05/20/18

## 2017-06-05 NOTE — Telephone Encounter (Signed)
New rx Done hardcopy to [facility] - I am unable to send to [facility] as I cannot find in search of pharmacies

## 2017-06-05 NOTE — Telephone Encounter (Signed)
Copied from [place] [id number]. Topic: Inquiry >> Jun 05, 2017 12:42 PM Corie Chiquito, [place] wrote: Reason for CRM: [place] [employer] called because she needs to speak with someone about a prior authorization that is needed for this patient. If someone could give her a call back at [phone] reference number is [id number]

## 2017-06-05 NOTE — Telephone Encounter (Signed)
Copied from [place] [phone]. Topic: Quick Communication - See Telephone Encounter >> Jun 05, 2017 12:22 PM Vernona Rieger wrote: CRM for notification. See Telephone encounter for:   06/05/17.  [facility] needs clarification on zolpidem (AMBIEN) 10 MG tablet  They need the diagnosis and the quantity  Call back is [phone]. Reference [id number]

## 2017-06-05 NOTE — Telephone Encounter (Signed)
Paper PA was received today, as wife was notified earlier. It will be completed and faxed back in within the time frame the office allows for turn around time of documents to be filled out.

## 2017-06-11 ENCOUNTER — Telehealth: Payer: Self-pay | Admitting: Internal Medicine

## 2017-06-11 DIAGNOSIS — E039 Hypothyroidism, unspecified: Secondary | ICD-10-CM

## 2017-06-11 NOTE — Telephone Encounter (Signed)
Copied from [place]. Topic: General - Other >> Jun 11, 2017 12:57 PM Patrice Paradise wrote: Reason for CRM: Patient called because Dr. Toy Care is requesting that Dr. Jenny Reichmann check his theroids levels during his blood work. Patient can be reached  @ [phone] with any questions.

## 2017-06-11 NOTE — Telephone Encounter (Signed)
TSH thyroid test was normal Feb 28, 2017  Repeat thyroid testing ordered

## 2017-06-12 NOTE — Telephone Encounter (Addendum)
Pt has been informed and stated he would have that done for his April appt.

## 2017-07-03 ENCOUNTER — Ambulatory Visit (INDEPENDENT_AMBULATORY_CARE_PROVIDER_SITE_OTHER): Payer: PPO | Admitting: Internal Medicine

## 2017-07-03 ENCOUNTER — Encounter: Payer: Self-pay | Admitting: Internal Medicine

## 2017-07-03 ENCOUNTER — Other Ambulatory Visit (INDEPENDENT_AMBULATORY_CARE_PROVIDER_SITE_OTHER): Payer: PPO

## 2017-07-03 VITALS — BP 134/86 | HR 85 | Temp 99.1°F | Ht 73.0 in | Wt 221.0 lb

## 2017-07-03 DIAGNOSIS — E1140 Type 2 diabetes mellitus with diabetic neuropathy, unspecified: Secondary | ICD-10-CM

## 2017-07-03 DIAGNOSIS — Z114 Encounter for screening for human immunodeficiency virus [HIV]: Secondary | ICD-10-CM

## 2017-07-03 DIAGNOSIS — E039 Hypothyroidism, unspecified: Secondary | ICD-10-CM

## 2017-07-03 DIAGNOSIS — Z0000 Encounter for general adult medical examination without abnormal findings: Secondary | ICD-10-CM

## 2017-07-03 DIAGNOSIS — F32A Depression, unspecified: Secondary | ICD-10-CM

## 2017-07-03 DIAGNOSIS — F329 Major depressive disorder, single episode, unspecified: Secondary | ICD-10-CM

## 2017-07-03 LAB — HEPATIC FUNCTION PANEL
ALT: 17 U/L (ref 0–53)
AST: 17 U/L (ref 0–37)
Albumin: 4.3 g/dL (ref 3.5–5.2)
Alkaline Phosphatase: 56 U/L (ref 39–117)
Bilirubin, Direct: 0.2 mg/dL (ref 0.0–0.3)
Total Bilirubin: 1 mg/dL (ref 0.2–1.2)
Total Protein: 7.5 g/dL (ref 6.0–8.3)

## 2017-07-03 LAB — BASIC METABOLIC PANEL
BUN: 18 mg/dL (ref 6–23)
CO2: 28 mEq/L (ref 19–32)
Calcium: 9.3 mg/dL (ref 8.4–10.5)
Chloride: 105 mEq/L (ref 96–112)
Creatinine, Ser: 0.98 mg/dL (ref 0.40–1.50)
GFR: 81.39 mL/min (ref >60.00)
Glucose, Bld: 122 mg/dL — ABNORMAL HIGH (ref 70–99)
Potassium: 4.4 mEq/L (ref 3.5–5.1)
Sodium: 140 mEq/L (ref 135–145)

## 2017-07-03 LAB — LIPID PANEL
CHOL/HDL RATIO: 3
CHOLESTEROL: 129 mg/dL (ref 0–200)
HDL: 44.7 mg/dL (ref >39.00)
LDL Cholesterol: 66 mg/dL (ref 0–99)
NonHDL: 83.97
TRIGLYCERIDES: 92 mg/dL (ref 0.0–149.0)
VLDL: 18.4 mg/dL (ref 0.0–40.0)

## 2017-07-03 LAB — T4, FREE: Free T4: 0.62 ng/dL (ref 0.60–1.60)

## 2017-07-03 LAB — HEMOGLOBIN A1C: Hgb A1c MFr Bld: 6.4 % (ref 4.6–6.5)

## 2017-07-03 LAB — TSH: TSH: 1.4 u[IU]/mL (ref 0.35–4.50)

## 2017-07-03 MED ORDER — TAMSULOSIN HCL 0.4 MG PO CAPS: 0.4000 mg | ORAL_CAPSULE | Freq: Every day | ORAL | 3 refills | Status: DC

## 2017-07-03 MED ORDER — ZOLPIDEM TARTRATE 10 MG PO TABS: 10.0000 mg | ORAL_TABLET | Freq: Every evening | ORAL | 1 refills | Status: DC | PRN

## 2017-07-03 MED ORDER — LOVASTATIN 40 MG PO TABS: 40.0000 mg | ORAL_TABLET | Freq: Every day | ORAL | 3 refills | Status: DC

## 2017-07-03 MED ORDER — METFORMIN HCL ER 500 MG PO TB24: 500.0000 mg | ORAL_TABLET | Freq: Every day | ORAL | 3 refills | Status: DC

## 2017-07-03 NOTE — Assessment & Plan Note (Signed)
stable overall by history and exam, recent data reviewed with pt, and pt to continue medical treatment as before,  to f/u any worsening symptoms or concerns Lab Results  Component Value Date   HGBA1C 6.4 07/03/2017

## 2017-07-03 NOTE — Progress Notes (Signed)
Subjective:    Patient ID: Paul Tucker, male    DOB: Sep 20, 1950, 67 y.o.   MRN: [id number]  HPI Here for wellness and f/u;  Overall doing ok;  Pt denies Chest pain, worsening SOB, DOE, wheezing, orthopnea, PND, worsening LE edema, palpitations, dizziness or syncope.  Pt denies neurological change such as new headache, facial or extremity weakness.  Pt denies polydipsia, polyuria, or low sugar symptoms. Pt states overall good compliance with treatment and medications, good tolerability, and has been trying to follow appropriate diet.  Pt denies worsening depressive symptoms, suicidal ideation or panic. No fever, night sweats, loss of appetite, or other constitutional symptoms.  Pt states good ability with ADL's, has low fall risk, home safety reviewed and adequate, no other significant changes in hearing or vision, and only occasionally active with exercise.  Has optho appt for mar 2019. Lost wt with prisitq per Dr Toy Care per pt, though did have viral illness about the same time.  Changed to remeron 30 qhs and doing better.  Denies worsening depressive symptoms, suicidal ideation, or panic; has ongoing anxiety.  Had some initial nausea with start remeron now improved.  Has been on lexapro but seemed to stop working. Wt Readings from Last 3 Encounters:  07/03/17 221 lb (100.2 kg)  06/04/17 222 lb (100.7 kg)  05/31/17 235 lb 12.8 oz (107 kg)   Past Medical History:  Diagnosis Date  . Acute prostatitis 04/04/2007  . ADD 01/17/2007  . DEPRESSION 01/17/2007  . Dermatophytosis of groin and perianal area 05/31/2008  . DIVERTICULOSIS, COLON 04/06/2007  . Dysuria 01/27/2009  . FLANK PAIN, LEFT 09/30/2007  . Flushing 08/26/2009  . GERD 01/20/2007  . GLUCOSE INTOLERANCE 04/04/2007  . Gross hematuria 01/27/2009  . HEMATOCHEZIA 07/26/2008  . HYPERLIPIDEMIA 01/20/2007  . HYPOGONADISM 06/13/2009  . Impaired glucose tolerance 12/10/2010  . INSOMNIA 04/04/2007  . LIBIDO, DECREASED 12/02/2008  . LICHEN SIMPLEX CHRONICUS  05/31/2008  . LOW BACK PAIN 01/20/2007  . OBSTRUCTIVE SLEEP APNEA 04/04/2007  . OTITIS MEDIA, ACUTE, BILATERAL 06/13/2009  . PANCREATITIS, HX OF 01/17/2007  . PSA, INCREASED 11/18/2009  . RASH-NONVESICULAR 08/26/2009  . SINUSITIS- ACUTE-NOS 04/04/2007  . SWEATING 09/30/2007  . THRUSH 04/04/2007  . TONSILLECTOMY AND ADENOIDECTOMY, HX OF 01/17/2007  . URI 07/25/2010  . URINARY RETENTION 01/27/2009  . VERTIGO 06/13/2009   Past Surgical History:  Procedure Laterality Date  . CHOLECYSTECTOMY  2005  . HEMORRHOID BANDING  2015  . HERNIA REPAIR  1957  . TONSILLECTOMY    . VASECTOMY  1987    reports that  has never smoked. he has never used smokeless tobacco. He reports that he drinks alcohol. He reports that he does not use drugs. family history includes Coronary artery disease in his other; Diabetes in his other; Emphysema in his father; Heart disease in his father. Allergies  Allergen Reactions  . Atrovent [Ipratropium] Other (See Comments)    Throat swelling  . Pravastatin Sodium     REACTION: nausea  Pt does not recall this reaction   Current Outpatient Medications on File Prior to Visit  Medication Sig Dispense Refill  . aspirin 81 MG EC tablet Take 81 mg by mouth daily.      . cetirizine (ZYRTEC) 10 MG tablet Take 10 mg by mouth daily.    . Ciclopirox 0.77 % gel USE AS DIRECTED TOPCIALLY 45 g 1  . cyclobenzaprine (FLEXERIL) 5 MG tablet Take 1 tablet (5 mg total) by mouth 3 (three) times daily as  needed for muscle spasms. 40 tablet 1  . fluocinonide cream (LIDEX) 5.80 % Apply 1 application topically 2 (two) times daily. 30 g 0  . ibuprofen (ADVIL,MOTRIN) 200 MG tablet 2-3 by mouth once daily as needed for back pain     . meclizine (ANTIVERT) 12.5 MG tablet 1-2 tabs by mouth every 8 hrs as needed for dizziness 90 tablet 0  . mirtazapine (REMERON) 30 MG tablet Take 30 mg by mouth at bedtime.    . triamcinolone (NASACORT AQ) 55 MCG/ACT AERO nasal inhaler Place 2 sprays into the nose daily. 3  Inhaler 3   No current facility-administered medications on file prior to visit.    Review of Systems Constitutional: Negative for other unusual diaphoresis, sweats, appetite or weight changes HENT: Negative for other worsening hearing loss, ear pain, facial swelling, mouth sores or neck stiffness.   Eyes: Negative for other worsening pain, redness or other visual disturbance.  Respiratory: Negative for other stridor or swelling Cardiovascular: Negative for other palpitations or other chest pain  Gastrointestinal: Negative for worsening diarrhea or loose stools, blood in stool, distention or other pain Genitourinary: Negative for hematuria, flank pain or other change in urine volume.  Musculoskeletal: Negative for myalgias or other joint swelling.  Skin: Negative for other color change, or other wound or worsening drainage.  Neurological: Negative for other syncope or numbness. Hematological: Negative for other adenopathy or swelling Psychiatric/Behavioral: Negative for hallucinations, other worsening agitation, SI, self-injury, or new decreased concentration \\All  other system neg per pt    Objective:   Physical Exam BP 134/86   Pulse 85   Temp 99.1 F (37.3 C) (Oral)   Ht 6\' 1"  (1.854 m)   Wt 221 lb (100.2 kg)   SpO2 100%   BMI 29.16 kg/m  VS noted,  Constitutional: Pt is oriented to person, place, and time. Appears well-developed and well-nourished, in no significant distress and comfortable Head: Normocephalic and atraumatic  Eyes: Conjunctivae and EOM are normal. Pupils are equal, round, and reactive to light Right Ear: External ear normal without discharge Left Ear: External ear normal without discharge Nose: Nose without discharge or deformity Mouth/Throat: Oropharynx is without other ulcerations and moist  Neck: Normal range of motion. Neck supple. No JVD present. No tracheal deviation present or significant neck LA or mass Cardiovascular: Normal rate, regular rhythm,  normal heart sounds and intact distal pulses.   Pulmonary/Chest: WOB normal and breath sounds without rales or wheezing  Abdominal: Soft. Bowel sounds are normal. NT. No HSM  Musculoskeletal: Normal range of motion. Exhibits no edema Lymphadenopathy: Has no other cervical adenopathy.  Neurological: Pt is alert and oriented to person, place, and time. Pt has normal reflexes. No cranial nerve deficit. Motor grossly intact, Gait intact Skin: Skin is warm and dry. No rash noted or new ulcerations Psychiatric:  Has dysphoric mood and affect. Behavior is normal without agitation No other exam findings Lab Results  Component Value Date   WBC 5.8 02/28/2017   HGB 14.5 02/28/2017   HCT 41.3 02/28/2017   PLT 220.0 02/28/2017   GLUCOSE 122 (H) 07/03/2017   CHOL 129 07/03/2017   TRIG 92.0 07/03/2017   HDL 44.70 07/03/2017   LDLDIRECT 82.0 02/28/2017   LDLCALC 66 07/03/2017   ALT 17 07/03/2017   AST 17 07/03/2017   NA 140 07/03/2017   K 4.4 07/03/2017   CL 105 07/03/2017   CREATININE 0.98 07/03/2017   BUN 18 07/03/2017   CO2 28 07/03/2017   TSH  1.40 07/03/2017   PSA 3.06 02/28/2017   HGBA1C 6.4 07/03/2017   MICROALBUR 1.0 07/27/2016       Assessment & Plan:

## 2017-07-03 NOTE — Patient Instructions (Signed)
Please continue all other medications as before, and refills have been done if requested.  Please have the pharmacy call with any other refills you may need.  Please continue your efforts at being more active, low cholesterol diet, and weight control.  You are otherwise up to date with prevention measures today.  Please keep your appointments with your specialists as you may have planned  Please return in 6 months, or sooner if needed, with Lab testing done 3-5 days before  

## 2017-07-03 NOTE — Assessment & Plan Note (Signed)
To cont same tx, for f/u psychiatry as planned

## 2017-07-03 NOTE — Assessment & Plan Note (Signed)

## 2017-07-04 LAB — HIV ANTIBODY (ROUTINE TESTING W REFLEX): HIV: NONREACTIVE

## 2017-07-15 ENCOUNTER — Ambulatory Visit: Payer: Self-pay

## 2017-07-15 NOTE — Telephone Encounter (Signed)
Patient's wife Bethena Roys was called back, she said "I spoke with the psychiatrist and decided to keep the appointment on Monday with Dr. Jenny Reichmann. But, I would like a complete blood workup done this week, so the results can be discussed on Monday at his visit." I advised this request would be sent to Dr. Jenny Reichmann and someone will be in touch with her with the decision. She can be called at [phone].

## 2017-07-16 NOTE — Telephone Encounter (Signed)
There should be no need, as pt just had complete lab work done feb 13; we can still see him at his next visit

## 2017-07-16 NOTE — Telephone Encounter (Signed)
Please advise 

## 2017-07-16 NOTE — Telephone Encounter (Signed)
Informed pt's wife, she expressed understanding. She wanted to cancel appt for Monday due to medication dose changes from his other provider to see if that would help appetite. She stated that his appetite has changed when he started that medication in November. She stated if he is still having issues that she would call back and schedule another one.

## 2017-07-19 ENCOUNTER — Other Ambulatory Visit (INDEPENDENT_AMBULATORY_CARE_PROVIDER_SITE_OTHER): Payer: PPO

## 2017-07-19 ENCOUNTER — Ambulatory Visit (INDEPENDENT_AMBULATORY_CARE_PROVIDER_SITE_OTHER)
Admission: RE | Admit: 2017-07-19 | Discharge: 2017-07-19 | Disposition: A | Discharge status: Home / Self Care | Payer: PPO | Source: Ambulatory Visit | Attending: Internal Medicine | Admitting: Internal Medicine

## 2017-07-19 ENCOUNTER — Ambulatory Visit (INDEPENDENT_AMBULATORY_CARE_PROVIDER_SITE_OTHER): Payer: PPO | Admitting: Internal Medicine

## 2017-07-19 ENCOUNTER — Encounter: Payer: Self-pay | Admitting: Internal Medicine

## 2017-07-19 ENCOUNTER — Other Ambulatory Visit: Payer: Self-pay | Admitting: Internal Medicine

## 2017-07-19 VITALS — BP 128/82 | HR 78 | Temp 99.0°F | Ht 73.0 in | Wt 218.0 lb

## 2017-07-19 DIAGNOSIS — F32A Depression, unspecified: Secondary | ICD-10-CM

## 2017-07-19 DIAGNOSIS — R630 Anorexia: Secondary | ICD-10-CM

## 2017-07-19 DIAGNOSIS — E785 Hyperlipidemia, unspecified: Secondary | ICD-10-CM

## 2017-07-19 DIAGNOSIS — E1140 Type 2 diabetes mellitus with diabetic neuropathy, unspecified: Secondary | ICD-10-CM

## 2017-07-19 DIAGNOSIS — F329 Major depressive disorder, single episode, unspecified: Secondary | ICD-10-CM | POA: Diagnosis not present

## 2017-07-19 DIAGNOSIS — E538 Deficiency of other specified B group vitamins: Secondary | ICD-10-CM

## 2017-07-19 DIAGNOSIS — R634 Abnormal weight loss: Secondary | ICD-10-CM

## 2017-07-19 DIAGNOSIS — E559 Vitamin D deficiency, unspecified: Secondary | ICD-10-CM

## 2017-07-19 DIAGNOSIS — R5383 Other fatigue: Secondary | ICD-10-CM

## 2017-07-19 LAB — CK: CK TOTAL: 62 U/L (ref 7–232)

## 2017-07-19 LAB — VITAMIN B12: VITAMIN B 12: 253 pg/mL (ref 211–911)

## 2017-07-19 LAB — VITAMIN D 25 HYDROXY (VIT D DEFICIENCY, FRACTURES): VITD: 20.06 ng/mL — ABNORMAL LOW (ref 30.00–100.00)

## 2017-07-19 LAB — TESTOSTERONE: Testosterone: 226.54 ng/dL — ABNORMAL LOW (ref 300.00–890.00)

## 2017-07-19 LAB — SEDIMENTATION RATE: SED RATE: 8 mm/h (ref 0–20)

## 2017-07-19 MED ORDER — MIRTAZAPINE 15 MG PO TABS: 15.0000 mg | ORAL_TABLET | Freq: Every day | ORAL | 3 refills | Status: DC

## 2017-07-19 MED ORDER — ESCITALOPRAM OXALATE 10 MG PO TABS: 10.0000 mg | ORAL_TABLET | Freq: Every day | ORAL | 3 refills | Status: DC

## 2017-07-19 MED ORDER — VITAMIN D (ERGOCALCIFEROL) 1.25 MG (50000 UNIT) PO CAPS: 50000.0000 [IU] | ORAL_CAPSULE | ORAL | 0 refills | Status: DC

## 2017-07-19 NOTE — Patient Instructions (Signed)
Please continue all other medications as before, and refills have been done if requested.  Please have the pharmacy call with any other refills you may need.  Please continue your efforts at being more active, low cholesterol diet, and weight control.  You are otherwise up to date with prevention measures today.  Please keep your appointments with your specialists as you may have planned  Please go to the XRAY Department in the Basement (go straight as you get off the elevator) for the x-ray testing  Please go to the LAB in the Basement (turn left off the elevator) for the tests to be done today  You will be contacted by phone if any changes need to be made immediately.  Otherwise, you will receive a letter about your results with an explanation, but please check with MyChart first.  Please remember to sign up for MyChart if you have not done so, as this will be important to you in the future with finding out test results, communicating by private email, and scheduling acute appointments online when needed.  

## 2017-07-19 NOTE — Progress Notes (Signed)
Subjective:    Patient ID: Paul Tucker, male    DOB: Nov 08, 1950, 67 y.o.   MRN: [id number]  HPI  Here with wife, has had worsening depression, and also recent wt loss, wife very concerned that there may be a medical reason other than depression.  Just has no appetite.  Pt denies chest pain, increased sob or doe, wheezing, orthopnea, PND, increased LE swelling, palpitations, dizziness or syncope.  Pt denies new neurological symptoms such as new headache, or facial or extremity weakness or numbness   Pt denies polydipsia, polyuria.  Wt Readings from Last 3 Encounters:  07/19/17 218 lb (98.9 kg)  07/03/17 221 lb (100.2 kg)  06/04/17 222 lb (100.7 kg)  Has seen Dr Toy Care , meds just changed 3 days ago to mirtazipine 15 and lexapro 10, also given small amount ativan but not yet filled.  Denies suicidal ideation, or panic.   Past Medical History:  Diagnosis Date  . Acute prostatitis 04/04/2007  . ADD 01/17/2007  . DEPRESSION 01/17/2007  . Dermatophytosis of groin and perianal area 05/31/2008  . DIVERTICULOSIS, COLON 04/06/2007  . Dysuria 01/27/2009  . FLANK PAIN, LEFT 09/30/2007  . Flushing 08/26/2009  . GERD 01/20/2007  . GLUCOSE INTOLERANCE 04/04/2007  . Gross hematuria 01/27/2009  . HEMATOCHEZIA 07/26/2008  . HYPERLIPIDEMIA 01/20/2007  . HYPOGONADISM 06/13/2009  . Impaired glucose tolerance 12/10/2010  . INSOMNIA 04/04/2007  . LIBIDO, DECREASED 12/02/2008  . 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 LOW BACK PAIN 01/20/2007  . OBSTRUCTIVE SLEEP APNEA 04/04/2007  . OTITIS MEDIA, ACUTE, BILATERAL 06/13/2009  . PANCREATITIS, HX OF 01/17/2007  . PSA, INCREASED 11/18/2009  . RASH-NONVESICULAR 08/26/2009  . SINUSITIS- ACUTE-NOS 04/04/2007  . SWEATING 09/30/2007  . THRUSH 04/04/2007  . TONSILLECTOMY AND ADENOIDECTOMY, HX OF 01/17/2007  . URI 07/25/2010  . URINARY RETENTION 01/27/2009  . VERTIGO 06/13/2009   Past Surgical History:  Procedure Laterality Date  . CHOLECYSTECTOMY  2005  . HEMORRHOID BANDING  2015  .  HERNIA REPAIR  1957  . TONSILLECTOMY    . VASECTOMY  1987    reports that  has never smoked. he has never used smokeless tobacco. He reports that he drinks alcohol. He reports that he does not use drugs. family history includes Coronary artery disease in his other; Diabetes in his other; Emphysema in his father; Heart disease in his father. Allergies  Allergen Reactions  . Atrovent [Ipratropium] Other (See Comments)    Throat swelling  . Pravastatin Sodium     REACTION: nausea  Pt does not recall this reaction   Current Outpatient Medications on File Prior to Visit  Medication Sig Dispense Refill  . aspirin 81 MG EC tablet Take 81 mg by mouth daily.      . cetirizine (ZYRTEC) 10 MG tablet Take 10 mg by mouth daily.    . Ciclopirox 0.77 % gel USE AS DIRECTED TOPCIALLY 45 g 1  . cyclobenzaprine (FLEXERIL) 5 MG tablet Take 1 tablet (5 mg total) by mouth 3 (three) times daily as needed for muscle spasms. 40 tablet 1  . fluocinonide cream (LIDEX) 1.28 % Apply 1 application topically 2 (two) times daily. 30 g 0  . ibuprofen (ADVIL,MOTRIN) 200 MG tablet 2-3 by mouth once daily as needed for back pain     . lovastatin (MEVACOR) 40 MG tablet Take 1 tablet (40 mg total) by mouth at bedtime. 90 tablet 3  . meclizine (ANTIVERT) 12.5 MG tablet 1-2 tabs by mouth every 8 hrs as  needed for dizziness 90 tablet 0  . metFORMIN (GLUCOPHAGE-XR) 500 MG 24 hr tablet Take 1 tablet (500 mg total) by mouth daily with breakfast. 90 tablet 3  . tamsulosin (FLOMAX) 0.4 MG CAPS capsule Take 1 capsule (0.4 mg total) by mouth daily. 90 capsule 3  . triamcinolone (NASACORT AQ) 55 MCG/ACT AERO nasal inhaler Place 2 sprays into the nose daily. 3 Inhaler 3  . zolpidem (AMBIEN) 10 MG tablet Take 1 tablet (10 mg total) by mouth at bedtime as needed for sleep. 90 tablet 1   No current facility-administered medications on file prior to visit.    Review of Systems  Constitutional: Negative for other unusual diaphoresis or  sweats HENT: Negative for ear discharge or swelling Eyes: Negative for other worsening visual disturbances Respiratory: Negative for stridor or other swelling  Gastrointestinal: Negative for worsening distension or other blood Genitourinary: Negative for retention or other urinary change Musculoskeletal: Negative for other MSK pain or swelling Skin: Negative for color change or other new lesions Neurological: Negative for worsening tremors and other numbness  Psychiatric/Behavioral: Negative for worsening agitation or other fatigue All other system neg per pt    Objective:   Physical Exam BP 128/82   Pulse 78   Temp 99 F (37.2 C) (Oral)   Ht 6\' 1"  (1.854 m)   Wt 218 lb (98.9 kg)   SpO2 98%   BMI 28.76 kg/m  VS noted, obese but has lost wt Constitutional: Pt appears in NAD HENT: Head: NCAT.  Right Ear: External ear normal.  Left Ear: External ear normal.  Eyes: . Pupils are equal, round, and reactive to light. Conjunctivae and EOM are normal Nose: without d/c or deformity Neck: Neck supple. Gross normal ROM Cardiovascular: Normal rate and regular rhythm.   Pulmonary/Chest: Effort normal and breath sounds without rales or wheezing.  Abd:  Soft, NT, ND, + BS, no organomegaly Neurological: Pt is alert. At baseline orientation, motor grossly intact Skin: Skin is warm. No rashes, other new lesions, no LE edema Psychiatric: Pt behavior is normal without agitation , markedly depressed affect with psychomotor retardation No other exam findings Lab Results  Component Value Date   WBC 5.8 02/28/2017   HGB 14.5 02/28/2017   HCT 41.3 02/28/2017   PLT 220.0 02/28/2017   GLUCOSE 122 (H) 07/03/2017   CHOL 129 07/03/2017   TRIG 92.0 07/03/2017   HDL 44.70 07/03/2017   LDLDIRECT 82.0 02/28/2017   LDLCALC 66 07/03/2017   ALT 17 07/03/2017   AST 17 07/03/2017   NA 140 07/03/2017   K 4.4 07/03/2017   CL 105 07/03/2017   CREATININE 0.98 07/03/2017   BUN 18 07/03/2017   CO2 28  07/03/2017   TSH 1.40 07/03/2017   PSA 3.06 02/28/2017   HGBA1C 6.4 07/03/2017   MICROALBUR 1.0 07/27/2016       Assessment & Plan:

## 2017-07-20 NOTE — Assessment & Plan Note (Addendum)
Etiology unclear, suspect related to depression, will also check cxr, testosterone level, spep, esr, Vit D and B12 levels,  to f/u any worsening symptoms or concerns  Note:  Total time for pt hx, exam, review of record with pt in the room, determination of diagnoses and plan for further eval and tx is > 40 min, with over 50% spent in coordination and counseling of patient including the differential dx, tx, further evaluation and other management of depression, wt loss, lower appetite, DM, HTN, HLD

## 2017-07-20 NOTE — Assessment & Plan Note (Signed)
Lab Results  Component Value Date   LDLCALC 66 07/03/2017  stable overall by history and exam, recent data reviewed with pt, and pt to continue medical treatment as before,  to f/u any worsening symptoms or concerns

## 2017-07-20 NOTE — Assessment & Plan Note (Signed)
Ok for glucerna to help with wt loss and lower appetite

## 2017-07-20 NOTE — Assessment & Plan Note (Signed)
Cont same tx, f/u Dr Toy Care as planned

## 2017-07-20 NOTE — Assessment & Plan Note (Signed)
stable overall by history and exam, recent data reviewed with pt, and pt to continue medical treatment as before,  to f/u any worsening symptoms or concerns Lab Results  Component Value Date   HGBA1C 6.4 07/03/2017

## 2017-07-22 ENCOUNTER — Ambulatory Visit: Payer: PPO | Admitting: Internal Medicine

## 2017-07-22 ENCOUNTER — Telehealth: Payer: Self-pay

## 2017-07-22 NOTE — Telephone Encounter (Signed)
-----   Message from Biagio Borg, MD sent at 07/19/2017  8:00 PM EST ----- Left message on MyChart, pt to cont same tx except  The test results show that your current treatment is OK, except the Vitamin D level is quite low.  I will send a prescription for Vitamin D 50000 units per wk for 12 wks to the pharmacy, and then after that please plan to take Vit D OTC 2000 units per day indefinitely.  Gwenivere Hiraldo to please inform pt, I will do rx

## 2017-07-22 NOTE — Telephone Encounter (Signed)
Pt has viewed results via MyChart  

## 2017-07-23 LAB — PROTEIN ELECTROPHORESIS, SERUM
ALPHA 2: 0.7 g/dL (ref 0.5–0.9)
Albumin ELP: 4.3 g/dL (ref 3.8–4.8)
Alpha 1: 0.3 g/dL (ref 0.2–0.3)
BETA 2: 0.3 g/dL (ref 0.2–0.5)
Beta Globulin: 0.4 g/dL (ref 0.4–0.6)
GAMMA GLOBULIN: 1.1 g/dL (ref 0.8–1.7)
TOTAL PROTEIN: 7.1 g/dL (ref 6.1–8.1)

## 2017-09-04 ENCOUNTER — Ambulatory Visit: Payer: 59 | Admitting: Internal Medicine

## 2017-09-18 ENCOUNTER — Other Ambulatory Visit: Payer: Self-pay | Admitting: Internal Medicine

## 2017-09-19 NOTE — Telephone Encounter (Signed)
Done erx 

## 2017-09-19 NOTE — Telephone Encounter (Signed)
08/24/2017 30# 

## 2017-09-23 DIAGNOSIS — M9903 Segmental and somatic dysfunction of lumbar region: Secondary | ICD-10-CM | POA: Diagnosis not present

## 2017-09-23 DIAGNOSIS — M9902 Segmental and somatic dysfunction of thoracic region: Secondary | ICD-10-CM | POA: Diagnosis not present

## 2017-09-23 DIAGNOSIS — M5440 Lumbago with sciatica, unspecified side: Secondary | ICD-10-CM | POA: Diagnosis not present

## 2017-09-23 DIAGNOSIS — M5136 Other intervertebral disc degeneration, lumbar region: Secondary | ICD-10-CM | POA: Diagnosis not present

## 2017-09-24 DIAGNOSIS — M9902 Segmental and somatic dysfunction of thoracic region: Secondary | ICD-10-CM | POA: Diagnosis not present

## 2017-09-24 DIAGNOSIS — M9903 Segmental and somatic dysfunction of lumbar region: Secondary | ICD-10-CM | POA: Diagnosis not present

## 2017-09-24 DIAGNOSIS — M5136 Other intervertebral disc degeneration, lumbar region: Secondary | ICD-10-CM | POA: Diagnosis not present

## 2017-09-24 DIAGNOSIS — M5440 Lumbago with sciatica, unspecified side: Secondary | ICD-10-CM | POA: Diagnosis not present

## 2017-09-25 DIAGNOSIS — H25813 Combined forms of age-related cataract, bilateral: Secondary | ICD-10-CM | POA: Diagnosis not present

## 2017-09-25 DIAGNOSIS — M5440 Lumbago with sciatica, unspecified side: Secondary | ICD-10-CM | POA: Diagnosis not present

## 2017-09-25 DIAGNOSIS — M9903 Segmental and somatic dysfunction of lumbar region: Secondary | ICD-10-CM | POA: Diagnosis not present

## 2017-09-25 DIAGNOSIS — H35041 Retinal micro-aneurysms, unspecified, right eye: Secondary | ICD-10-CM | POA: Diagnosis not present

## 2017-09-25 DIAGNOSIS — H11153 Pinguecula, bilateral: Secondary | ICD-10-CM | POA: Diagnosis not present

## 2017-09-25 DIAGNOSIS — H5203 Hypermetropia, bilateral: Secondary | ICD-10-CM | POA: Diagnosis not present

## 2017-09-25 DIAGNOSIS — H52223 Regular astigmatism, bilateral: Secondary | ICD-10-CM | POA: Diagnosis not present

## 2017-09-25 DIAGNOSIS — M9902 Segmental and somatic dysfunction of thoracic region: Secondary | ICD-10-CM | POA: Diagnosis not present

## 2017-09-25 DIAGNOSIS — M5136 Other intervertebral disc degeneration, lumbar region: Secondary | ICD-10-CM | POA: Diagnosis not present

## 2017-09-25 DIAGNOSIS — E11319 Type 2 diabetes mellitus with unspecified diabetic retinopathy without macular edema: Secondary | ICD-10-CM | POA: Diagnosis not present

## 2017-09-25 LAB — HM DIABETES EYE EXAM

## 2017-09-26 DIAGNOSIS — M9902 Segmental and somatic dysfunction of thoracic region: Secondary | ICD-10-CM | POA: Diagnosis not present

## 2017-09-26 DIAGNOSIS — M5440 Lumbago with sciatica, unspecified side: Secondary | ICD-10-CM | POA: Diagnosis not present

## 2017-09-26 DIAGNOSIS — M9903 Segmental and somatic dysfunction of lumbar region: Secondary | ICD-10-CM | POA: Diagnosis not present

## 2017-09-26 DIAGNOSIS — M5136 Other intervertebral disc degeneration, lumbar region: Secondary | ICD-10-CM | POA: Diagnosis not present

## 2017-10-03 DIAGNOSIS — M9903 Segmental and somatic dysfunction of lumbar region: Secondary | ICD-10-CM | POA: Diagnosis not present

## 2017-10-03 DIAGNOSIS — M5136 Other intervertebral disc degeneration, lumbar region: Secondary | ICD-10-CM | POA: Diagnosis not present

## 2017-10-03 DIAGNOSIS — M5440 Lumbago with sciatica, unspecified side: Secondary | ICD-10-CM | POA: Diagnosis not present

## 2017-10-03 DIAGNOSIS — M9902 Segmental and somatic dysfunction of thoracic region: Secondary | ICD-10-CM | POA: Diagnosis not present

## 2017-10-07 DIAGNOSIS — M9902 Segmental and somatic dysfunction of thoracic region: Secondary | ICD-10-CM | POA: Diagnosis not present

## 2017-10-07 DIAGNOSIS — M5136 Other intervertebral disc degeneration, lumbar region: Secondary | ICD-10-CM | POA: Diagnosis not present

## 2017-10-07 DIAGNOSIS — M5440 Lumbago with sciatica, unspecified side: Secondary | ICD-10-CM | POA: Diagnosis not present

## 2017-10-07 DIAGNOSIS — M9903 Segmental and somatic dysfunction of lumbar region: Secondary | ICD-10-CM | POA: Diagnosis not present

## 2017-11-06 DIAGNOSIS — L905 Scar conditions and fibrosis of skin: Secondary | ICD-10-CM | POA: Diagnosis not present

## 2017-11-06 DIAGNOSIS — D1801 Hemangioma of skin and subcutaneous tissue: Secondary | ICD-10-CM | POA: Diagnosis not present

## 2017-11-06 DIAGNOSIS — D225 Melanocytic nevi of trunk: Secondary | ICD-10-CM | POA: Diagnosis not present

## 2017-11-06 DIAGNOSIS — L821 Other seborrheic keratosis: Secondary | ICD-10-CM | POA: Diagnosis not present

## 2017-11-06 DIAGNOSIS — D2261 Melanocytic nevi of right upper limb, including shoulder: Secondary | ICD-10-CM | POA: Diagnosis not present

## 2017-12-03 ENCOUNTER — Telehealth: Payer: Self-pay | Admitting: Internal Medicine

## 2017-12-03 NOTE — Telephone Encounter (Signed)
Copied from [place] [id number]. 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     Has the patient contacted their pharmacy? No  (Agent: If no, request that the patient contact the pharmacy for the refill.) (Agent: If yes, when and what did the pharmacy advise?)  Preferred Pharmacy (with phone number or street name): [facility] [address], [place] - [address] [phone] (Phone)   Agent: Please be advised that RX refills may take up to 3 business days. We ask that you follow-up with your pharmacy.

## 2017-12-04 NOTE — Telephone Encounter (Signed)
MD is out of the office pls advise on msg below../lmb 

## 2017-12-05 MED ORDER — MOMETASONE FUROATE 0.1 % EX CREA: 1.0000 "application " | TOPICAL_CREAM | Freq: Every day | CUTANEOUS | 0 refills | Status: DC

## 2017-12-05 NOTE — Telephone Encounter (Signed)
rx sent to pharmacy on file

## 2017-12-16 ENCOUNTER — Other Ambulatory Visit: Payer: Self-pay | Admitting: Internal Medicine

## 2017-12-16 NOTE — Telephone Encounter (Signed)
Done erx 

## 2017-12-16 NOTE — Telephone Encounter (Signed)
11/17/2017  30# 

## 2017-12-24 ENCOUNTER — Other Ambulatory Visit (INDEPENDENT_AMBULATORY_CARE_PROVIDER_SITE_OTHER): Payer: PPO

## 2017-12-24 DIAGNOSIS — E1140 Type 2 diabetes mellitus with diabetic neuropathy, unspecified: Secondary | ICD-10-CM | POA: Diagnosis not present

## 2017-12-24 LAB — CBC WITH DIFFERENTIAL/PLATELET
BASOS PCT: 0.9 % (ref 0.0–3.0)
Basophils Absolute: 0 10*3/uL (ref 0.0–0.1)
EOS PCT: 4.9 % (ref 0.0–5.0)
Eosinophils Absolute: 0.3 10*3/uL (ref 0.0–0.7)
HEMATOCRIT: 40.4 % (ref 39.0–52.0)
HEMOGLOBIN: 14.4 g/dL (ref 13.0–17.0)
LYMPHS PCT: 28.1 % (ref 12.0–46.0)
Lymphs Abs: 1.5 10*3/uL (ref 0.7–4.0)
MCHC: 35.8 g/dL (ref 30.0–36.0)
MCV: 95.3 fl (ref 78.0–100.0)
Monocytes Absolute: 0.5 10*3/uL (ref 0.1–1.0)
Monocytes Relative: 8.6 % (ref 3.0–12.0)
NEUTROS ABS: 3.1 10*3/uL (ref 1.4–7.7)
Neutrophils Relative %: 57.5 % (ref 43.0–77.0)
PLATELETS: 202 10*3/uL (ref 150.0–400.0)
RBC: 4.24 Mil/uL (ref 4.22–5.81)
RDW: 12.3 % (ref 11.5–15.5)
WBC: 5.3 10*3/uL (ref 4.0–10.5)

## 2017-12-24 LAB — LIPID PANEL
CHOL/HDL RATIO: 3
CHOLESTEROL: 132 mg/dL (ref 0–200)
HDL: 44.5 mg/dL (ref >39.00)
LDL CALC: 63 mg/dL (ref 0–99)
NONHDL: 87.77
Triglycerides: 124 mg/dL (ref 0.0–149.0)
VLDL: 24.8 mg/dL (ref 0.0–40.0)

## 2017-12-24 LAB — BASIC METABOLIC PANEL
BUN: 17 mg/dL (ref 6–23)
CHLORIDE: 103 meq/L (ref 96–112)
CO2: 28 meq/L (ref 19–32)
CREATININE: 1.13 mg/dL (ref 0.40–1.50)
Calcium: 9.7 mg/dL (ref 8.4–10.5)
GFR: 68.95 mL/min (ref >60.00)
GLUCOSE: 249 mg/dL — AB (ref 70–99)
POTASSIUM: 4 meq/L (ref 3.5–5.1)
SODIUM: 138 meq/L (ref 135–145)

## 2017-12-24 LAB — HEMOGLOBIN A1C: Hgb A1c MFr Bld: 7.1 % — ABNORMAL HIGH (ref 4.6–6.5)

## 2017-12-24 LAB — HEPATIC FUNCTION PANEL
ALBUMIN: 4.4 g/dL (ref 3.5–5.2)
ALT: 18 U/L (ref 0–53)
AST: 16 U/L (ref 0–37)
Alkaline Phosphatase: 65 U/L (ref 39–117)
BILIRUBIN DIRECT: 0.2 mg/dL (ref 0.0–0.3)
Total Bilirubin: 0.7 mg/dL (ref 0.2–1.2)
Total Protein: 7.3 g/dL (ref 6.0–8.3)

## 2018-01-01 ENCOUNTER — Ambulatory Visit (INDEPENDENT_AMBULATORY_CARE_PROVIDER_SITE_OTHER): Payer: PPO | Admitting: Internal Medicine

## 2018-01-01 ENCOUNTER — Encounter: Payer: Self-pay | Admitting: Internal Medicine

## 2018-01-01 VITALS — BP 128/86 | HR 82 | Temp 98.0°F | Ht 73.0 in | Wt 232.0 lb

## 2018-01-01 DIAGNOSIS — F32A Depression, unspecified: Secondary | ICD-10-CM

## 2018-01-01 DIAGNOSIS — Z0000 Encounter for general adult medical examination without abnormal findings: Secondary | ICD-10-CM | POA: Diagnosis not present

## 2018-01-01 DIAGNOSIS — F988 Other specified behavioral and emotional disorders with onset usually occurring in childhood and adolescence: Secondary | ICD-10-CM | POA: Diagnosis not present

## 2018-01-01 DIAGNOSIS — F329 Major depressive disorder, single episode, unspecified: Secondary | ICD-10-CM

## 2018-01-01 DIAGNOSIS — E785 Hyperlipidemia, unspecified: Secondary | ICD-10-CM

## 2018-01-01 DIAGNOSIS — E1340 Other specified diabetes mellitus with diabetic neuropathy, unspecified: Secondary | ICD-10-CM

## 2018-01-01 NOTE — Assessment & Plan Note (Signed)
Much improved over last 6 mo, cont same tx, f/u Dr Toy Care

## 2018-01-01 NOTE — Assessment & Plan Note (Signed)
stable overall by history and exam, recent data reviewed with pt, and pt to continue medical treatment as before,  to f/u any worsening symptoms or concerns Lab Results  Component Value Date   LDLCALC 63 12/24/2017

## 2018-01-01 NOTE — Patient Instructions (Signed)
Please continue all other medications as before, and refills have been done if requested.  Please have the pharmacy call with any other refills you may need.  Please continue your efforts at being more active, low cholesterol diet, and weight control..  Please keep your appointments with your specialists as you may have planned  Please return in 6 months, or sooner if needed, with Lab testing done 3-5 days before  

## 2018-01-01 NOTE — Assessment & Plan Note (Signed)
Improved with recent adderall restart per Dr Toy Care

## 2018-01-01 NOTE — Progress Notes (Signed)
Subjective:    Patient ID: Paul Tucker, male    DOB: 05-Mar-1951, 67 y.o.   MRN: [id number]  HPI  Here to f/u; overall doing ok,  Pt denies chest pain, increasing sob or doe, wheezing, orthopnea, PND, increased LE swelling, palpitations, dizziness or syncope.  Pt denies new neurological symptoms such as new headache, or facial or extremity weakness or numbness.  Pt denies polydipsia, polyuria, or low sugar episode.  Pt states overall good compliance with meds, mostly trying to follow appropriate diet, with wt overall stable,  but little exercise however   Has gained several lbs with psych med x 2 that tend to cause wt gain.   Wt Readings from Last 3 Encounters:  01/01/18 232 lb (105.2 kg)  07/19/17 218 lb (98.9 kg)  07/03/17 221 lb (100.2 kg)  Declines metformin restart and will work on diet wt loss.  Hasnt started adderall yet per Dr Toy Care for ADD as insurance is balking.  Depression better about 95% per pt.   Past Medical History:  Diagnosis Date  . Acute prostatitis 04/04/2007  . ADD 01/17/2007  . DEPRESSION 01/17/2007  . Dermatophytosis of groin and perianal area 05/31/2008  . DIVERTICULOSIS, COLON 04/06/2007  . Dysuria 01/27/2009  . FLANK PAIN, LEFT 09/30/2007  . Flushing 08/26/2009  . GERD 01/20/2007  . GLUCOSE INTOLERANCE 04/04/2007  . Gross hematuria 01/27/2009  . HEMATOCHEZIA 07/26/2008  . HYPERLIPIDEMIA 01/20/2007  . HYPOGONADISM 06/13/2009  . Impaired glucose tolerance 12/10/2010  . INSOMNIA 04/04/2007  . LIBIDO, DECREASED 12/02/2008  . 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 LOW BACK PAIN 01/20/2007  . OBSTRUCTIVE SLEEP APNEA 04/04/2007  . OTITIS MEDIA, ACUTE, BILATERAL 06/13/2009  . PANCREATITIS, HX OF 01/17/2007  . PSA, INCREASED 11/18/2009  . RASH-NONVESICULAR 08/26/2009  . SINUSITIS- ACUTE-NOS 04/04/2007  . SWEATING 09/30/2007  . THRUSH 04/04/2007  . TONSILLECTOMY AND ADENOIDECTOMY, HX OF 01/17/2007  . URI 07/25/2010  . URINARY RETENTION 01/27/2009  . VERTIGO 06/13/2009   Past Surgical  History:  Procedure Laterality Date  . CHOLECYSTECTOMY  2005  . HEMORRHOID BANDING  2015  . HERNIA REPAIR  1957  . TONSILLECTOMY    . VASECTOMY  1987    reports that he has never smoked. He has never used smokeless tobacco. He reports that he drinks alcohol. He reports that he does not use drugs. family history includes Coronary artery disease in his other; Diabetes in his other; Emphysema in his father; Heart disease in his father. Allergies  Allergen Reactions  . Atrovent [Ipratropium] Other (See Comments)    Throat swelling  . Pravastatin Sodium     REACTION: nausea  Pt does not recall this reaction   Current Outpatient Medications on File Prior to Visit  Medication Sig Dispense Refill  . amphetamine-dextroamphetamine (ADDERALL) 20 MG tablet Take 20 mg by mouth daily.    Marland Kitchen aspirin 81 MG EC tablet Take 81 mg by mouth daily.      . Ciclopirox 0.77 % gel USE AS DIRECTED TOPCIALLY 45 g 1  . fluocinonide cream (LIDEX) 6.71 % Apply 1 application topically 2 (two) times daily. 30 g 0  . ibuprofen (ADVIL,MOTRIN) 200 MG tablet 2-3 by mouth once daily as needed for back pain     . lovastatin (MEVACOR) 40 MG tablet Take 1 tablet (40 mg total) by mouth at bedtime. 90 tablet 3  . mometasone (ELOCON) 0.1 % cream Apply 1 application topically daily. 45 g 0  . OLANZapine (ZYPREXA) 5 MG tablet Take  5 mg by mouth at bedtime.    . tamsulosin (FLOMAX) 0.4 MG CAPS capsule Take 1 capsule (0.4 mg total) by mouth daily. 90 capsule 3  . zolpidem (AMBIEN) 10 MG tablet TAKE ONE TABLET BY MOUTH AT BEDTIME 90 tablet 1  . escitalopram (LEXAPRO) 10 MG tablet Take 1 tablet (10 mg total) by mouth daily. 90 tablet 3   No current facility-administered medications on file prior to visit.    Review of Systems  Constitutional: Negative for other unusual diaphoresis or sweats HENT: Negative for ear discharge or swelling Eyes: Negative for other worsening visual disturbances Respiratory: Negative for stridor or  other swelling  Gastrointestinal: Negative for worsening distension or other blood Genitourinary: Negative for retention or other urinary change Musculoskeletal: Negative for other MSK pain or swelling Skin: Negative for color change or other new lesions Neurological: Negative for worsening tremors and other numbness  Psychiatric/Behavioral: Negative for worsening agitation or other fatigue All other system neg per pt    Objective:   Physical Exam BP 128/86   Pulse 82   Temp 98 F (36.7 C) (Oral)   Ht 6\' 1"  (1.854 m)   Wt 232 lb (105.2 kg)   SpO2 98%   BMI 30.61 kg/m  VS noted,  Constitutional: Pt appears in NAD HENT: Head: NCAT.  Right Ear: External ear normal.  Left Ear: External ear normal.  Eyes: . Pupils are equal, round, and reactive to light. Conjunctivae and EOM are normal Nose: without d/c or deformity Neck: Neck supple. Gross normal ROM Cardiovascular: Normal rate and regular rhythm.   Pulmonary/Chest: Effort normal and breath sounds without rales or wheezing.  Abd:  Soft, NT, ND, + BS, no organomegaly Neurological: Pt is alert. At baseline orientation, motor grossly intact Skin: Skin is warm. No rashes, other new lesions, no LE edema Psychiatric: Pt behavior is normal without agitation  No other exam findings Lab Results  Component Value Date   WBC 5.3 12/24/2017   HGB 14.4 12/24/2017   HCT 40.4 12/24/2017   PLT 202.0 12/24/2017   GLUCOSE 249 (H) 12/24/2017   CHOL 132 12/24/2017   TRIG 124.0 12/24/2017   HDL 44.50 12/24/2017   LDLDIRECT 82.0 02/28/2017   LDLCALC 63 12/24/2017   ALT 18 12/24/2017   AST 16 12/24/2017   NA 138 12/24/2017   K 4.0 12/24/2017   CL 103 12/24/2017   CREATININE 1.13 12/24/2017   BUN 17 12/24/2017   CO2 28 12/24/2017   TSH 1.40 07/03/2017   PSA 3.06 02/28/2017   HGBA1C 7.1 (H) 12/24/2017   MICROALBUR 1.0 07/27/2016         Assessment & Plan:

## 2018-01-01 NOTE — Assessment & Plan Note (Signed)
Mild uncontrolled, but declines metformin restart  - to work on diet and further wt loss, o/w stable overall by history and exam, recent data reviewed with pt, and pt to continue medical treatment as before,  to f/u any worsening symptoms or concerns Lab Results  Component Value Date   HGBA1C 7.1 (H) 12/24/2017

## 2018-02-07 ENCOUNTER — Ambulatory Visit (INDEPENDENT_AMBULATORY_CARE_PROVIDER_SITE_OTHER): Payer: PPO | Admitting: Emergency Medicine

## 2018-02-07 DIAGNOSIS — Z23 Encounter for immunization: Secondary | ICD-10-CM

## 2018-03-12 DIAGNOSIS — M545 Low back pain: Secondary | ICD-10-CM | POA: Diagnosis not present

## 2018-03-13 DIAGNOSIS — M47896 Other spondylosis, lumbar region: Secondary | ICD-10-CM | POA: Diagnosis not present

## 2018-03-24 DIAGNOSIS — M5440 Lumbago with sciatica, unspecified side: Secondary | ICD-10-CM | POA: Diagnosis not present

## 2018-03-24 DIAGNOSIS — M5136 Other intervertebral disc degeneration, lumbar region: Secondary | ICD-10-CM | POA: Diagnosis not present

## 2018-03-24 DIAGNOSIS — M9903 Segmental and somatic dysfunction of lumbar region: Secondary | ICD-10-CM | POA: Diagnosis not present

## 2018-03-24 DIAGNOSIS — M9902 Segmental and somatic dysfunction of thoracic region: Secondary | ICD-10-CM | POA: Diagnosis not present

## 2018-03-26 DIAGNOSIS — M5440 Lumbago with sciatica, unspecified side: Secondary | ICD-10-CM | POA: Diagnosis not present

## 2018-03-26 DIAGNOSIS — M5136 Other intervertebral disc degeneration, lumbar region: Secondary | ICD-10-CM | POA: Diagnosis not present

## 2018-03-26 DIAGNOSIS — M9903 Segmental and somatic dysfunction of lumbar region: Secondary | ICD-10-CM | POA: Diagnosis not present

## 2018-03-26 DIAGNOSIS — M9902 Segmental and somatic dysfunction of thoracic region: Secondary | ICD-10-CM | POA: Diagnosis not present

## 2018-03-27 DIAGNOSIS — M5136 Other intervertebral disc degeneration, lumbar region: Secondary | ICD-10-CM | POA: Diagnosis not present

## 2018-03-27 DIAGNOSIS — M5440 Lumbago with sciatica, unspecified side: Secondary | ICD-10-CM | POA: Diagnosis not present

## 2018-03-27 DIAGNOSIS — M9903 Segmental and somatic dysfunction of lumbar region: Secondary | ICD-10-CM | POA: Diagnosis not present

## 2018-03-27 DIAGNOSIS — M9902 Segmental and somatic dysfunction of thoracic region: Secondary | ICD-10-CM | POA: Diagnosis not present

## 2018-03-31 DIAGNOSIS — M5440 Lumbago with sciatica, unspecified side: Secondary | ICD-10-CM | POA: Diagnosis not present

## 2018-03-31 DIAGNOSIS — M9903 Segmental and somatic dysfunction of lumbar region: Secondary | ICD-10-CM | POA: Diagnosis not present

## 2018-03-31 DIAGNOSIS — M5136 Other intervertebral disc degeneration, lumbar region: Secondary | ICD-10-CM | POA: Diagnosis not present

## 2018-03-31 DIAGNOSIS — M9902 Segmental and somatic dysfunction of thoracic region: Secondary | ICD-10-CM | POA: Diagnosis not present

## 2018-04-03 DIAGNOSIS — M5136 Other intervertebral disc degeneration, lumbar region: Secondary | ICD-10-CM | POA: Diagnosis not present

## 2018-04-03 DIAGNOSIS — M9903 Segmental and somatic dysfunction of lumbar region: Secondary | ICD-10-CM | POA: Diagnosis not present

## 2018-04-03 DIAGNOSIS — M5440 Lumbago with sciatica, unspecified side: Secondary | ICD-10-CM | POA: Diagnosis not present

## 2018-04-03 DIAGNOSIS — M9902 Segmental and somatic dysfunction of thoracic region: Secondary | ICD-10-CM | POA: Diagnosis not present

## 2018-04-07 DIAGNOSIS — M9902 Segmental and somatic dysfunction of thoracic region: Secondary | ICD-10-CM | POA: Diagnosis not present

## 2018-04-07 DIAGNOSIS — M5136 Other intervertebral disc degeneration, lumbar region: Secondary | ICD-10-CM | POA: Diagnosis not present

## 2018-04-07 DIAGNOSIS — M9903 Segmental and somatic dysfunction of lumbar region: Secondary | ICD-10-CM | POA: Diagnosis not present

## 2018-04-07 DIAGNOSIS — M5440 Lumbago with sciatica, unspecified side: Secondary | ICD-10-CM | POA: Diagnosis not present

## 2018-04-10 DIAGNOSIS — M5136 Other intervertebral disc degeneration, lumbar region: Secondary | ICD-10-CM | POA: Diagnosis not present

## 2018-04-10 DIAGNOSIS — M9902 Segmental and somatic dysfunction of thoracic region: Secondary | ICD-10-CM | POA: Diagnosis not present

## 2018-04-10 DIAGNOSIS — M9903 Segmental and somatic dysfunction of lumbar region: Secondary | ICD-10-CM | POA: Diagnosis not present

## 2018-04-10 DIAGNOSIS — M5440 Lumbago with sciatica, unspecified side: Secondary | ICD-10-CM | POA: Diagnosis not present

## 2018-06-20 ENCOUNTER — Other Ambulatory Visit: Payer: Self-pay | Admitting: Internal Medicine

## 2018-06-20 NOTE — Telephone Encounter (Signed)
Done erx 

## 2018-07-08 ENCOUNTER — Ambulatory Visit: Payer: PPO | Admitting: Internal Medicine

## 2018-07-15 ENCOUNTER — Other Ambulatory Visit (INDEPENDENT_AMBULATORY_CARE_PROVIDER_SITE_OTHER): Payer: PPO

## 2018-07-15 DIAGNOSIS — E1340 Other specified diabetes mellitus with diabetic neuropathy, unspecified: Secondary | ICD-10-CM | POA: Diagnosis not present

## 2018-07-15 DIAGNOSIS — Z0000 Encounter for general adult medical examination without abnormal findings: Secondary | ICD-10-CM

## 2018-07-15 LAB — PSA: PSA: 3.93 ng/mL (ref 0.10–4.00)

## 2018-07-15 LAB — CBC WITH DIFFERENTIAL/PLATELET
Basophils Absolute: 0 10*3/uL (ref 0.0–0.1)
Basophils Relative: 0.8 % (ref 0.0–3.0)
Eosinophils Absolute: 0.3 10*3/uL (ref 0.0–0.7)
Eosinophils Relative: 5.1 % — ABNORMAL HIGH (ref 0.0–5.0)
HCT: 42.3 % (ref 39.0–52.0)
Hemoglobin: 14.6 g/dL (ref 13.0–17.0)
LYMPHS ABS: 1.5 10*3/uL (ref 0.7–4.0)
Lymphocytes Relative: 24 % (ref 12.0–46.0)
MCHC: 34.5 g/dL (ref 30.0–36.0)
MCV: 96.3 fl (ref 78.0–100.0)
MONOS PCT: 7.4 % (ref 3.0–12.0)
Monocytes Absolute: 0.5 10*3/uL (ref 0.1–1.0)
Neutro Abs: 3.9 10*3/uL (ref 1.4–7.7)
Neutrophils Relative %: 62.7 % (ref 43.0–77.0)
PLATELETS: 209 10*3/uL (ref 150.0–400.0)
RBC: 4.39 Mil/uL (ref 4.22–5.81)
RDW: 12.6 % (ref 11.5–15.5)
WBC: 6.2 10*3/uL (ref 4.0–10.5)

## 2018-07-15 LAB — LIPID PANEL
Cholesterol: 132 mg/dL (ref 0–200)
HDL: 40.6 mg/dL (ref >39.00)
LDL Cholesterol: 61 mg/dL (ref 0–99)
NonHDL: 91.53
Total CHOL/HDL Ratio: 3
Triglycerides: 155 mg/dL — ABNORMAL HIGH (ref 0.0–149.0)
VLDL: 31 mg/dL (ref 0.0–40.0)

## 2018-07-15 LAB — BASIC METABOLIC PANEL
BUN: 18 mg/dL (ref 6–23)
CALCIUM: 9.5 mg/dL (ref 8.4–10.5)
CHLORIDE: 103 meq/L (ref 96–112)
CO2: 26 meq/L (ref 19–32)
Creatinine, Ser: 1.03 mg/dL (ref 0.40–1.50)
GFR: 72.07 mL/min (ref >60.00)
Glucose, Bld: 187 mg/dL — ABNORMAL HIGH (ref 70–99)
Potassium: 3.9 mEq/L (ref 3.5–5.1)
Sodium: 140 mEq/L (ref 135–145)

## 2018-07-15 LAB — MICROALBUMIN / CREATININE URINE RATIO
CREATININE, U: 200.5 mg/dL
Microalb Creat Ratio: 0.7 mg/g (ref 0.0–30.0)
Microalb, Ur: 1.3 mg/dL (ref 0.0–1.9)

## 2018-07-15 LAB — HEPATIC FUNCTION PANEL
ALT: 18 U/L (ref 0–53)
AST: 17 U/L (ref 0–37)
Albumin: 4.7 g/dL (ref 3.5–5.2)
Alkaline Phosphatase: 65 U/L (ref 39–117)
Bilirubin, Direct: 0.2 mg/dL (ref 0.0–0.3)
Total Bilirubin: 1.1 mg/dL (ref 0.2–1.2)
Total Protein: 7.2 g/dL (ref 6.0–8.3)

## 2018-07-15 LAB — URINALYSIS, ROUTINE W REFLEX MICROSCOPIC
Bilirubin Urine: NEGATIVE
Hgb urine dipstick: NEGATIVE
KETONES UR: NEGATIVE
LEUKOCYTE UA: NEGATIVE
NITRITE: NEGATIVE
RBC / HPF: NONE SEEN (ref 0–?)
SPECIFIC GRAVITY, URINE: 1.025 (ref 1.000–1.030)
TOTAL PROTEIN, URINE-UPE24: NEGATIVE
URINE GLUCOSE: NEGATIVE
UROBILINOGEN UA: 1 (ref 0.0–1.0)
pH: 5.5 (ref 5.0–8.0)

## 2018-07-15 LAB — TSH: TSH: 1.84 u[IU]/mL (ref 0.35–4.50)

## 2018-07-15 LAB — HEMOGLOBIN A1C: HEMOGLOBIN A1C: 7.2 % — AB (ref 4.6–6.5)

## 2018-07-21 ENCOUNTER — Ambulatory Visit (INDEPENDENT_AMBULATORY_CARE_PROVIDER_SITE_OTHER): Payer: PPO | Admitting: Internal Medicine

## 2018-07-21 ENCOUNTER — Encounter: Payer: Self-pay | Admitting: Internal Medicine

## 2018-07-21 VITALS — BP 130/82 | HR 115 | Temp 97.8°F | Ht 73.0 in | Wt 227.0 lb

## 2018-07-21 DIAGNOSIS — Z0000 Encounter for general adult medical examination without abnormal findings: Secondary | ICD-10-CM | POA: Diagnosis not present

## 2018-07-21 DIAGNOSIS — E1340 Other specified diabetes mellitus with diabetic neuropathy, unspecified: Secondary | ICD-10-CM

## 2018-07-21 MED ORDER — ZOSTER VAC RECOMB ADJUVANTED 50 MCG/0.5ML IM SUSR: 0.5000 mL | Freq: Once | INTRAMUSCULAR | 1 refills | Status: AC

## 2018-07-21 NOTE — Patient Instructions (Signed)
Your shingles shot prescription was sent to the pharmacy  Please continue all other medications as before, and refills have been done if requested.  Please have the pharmacy call with any other refills you may need.  Please continue your efforts at being more active, low cholesterol diet, and weight control.  You are otherwise up to date with prevention measures today.  Please keep your appointments with your specialists as you may have planned  Please return in 6 months, or sooner if needed, with Lab testing done 3-5 days before

## 2018-07-21 NOTE — Assessment & Plan Note (Signed)
Mild uncontrolled, declines start metformin, to cont diet and wt control efforts

## 2018-07-21 NOTE — Progress Notes (Signed)
Subjective:    Patient ID: Paul Tucker, male    DOB: 27-Feb-1951, 68 y.o.   MRN: [id number]  HPI  Here for wellness and f/u;  Overall doing ok;  Pt denies Chest pain, worsening SOB, DOE, wheezing, orthopnea, PND, worsening LE edema, palpitations, dizziness or syncope.  Pt denies neurological change such as new headache, facial or extremity weakness.  Pt denies polydipsia, polyuria, or low sugar symptoms. Pt states overall good compliance with treatment and medications, good tolerability, and has been trying to follow appropriate diet.  Pt denies worsening depressive symptoms, suicidal ideation or panic. No fever, night sweats, wt loss, loss of appetite, or other constitutional symptoms.  Pt states good ability with ADL's, has low fall risk, home safety reviewed and adequate, no other significant changes in hearing or vision, and only occasionally active with exercise. Seeing Dr Toy Care with current trintellix 17 ,gand olanzapine 5 mg  No new complaints Past Medical History:  Diagnosis Date  . Acute prostatitis 04/04/2007  . ADD 01/17/2007  . DEPRESSION 01/17/2007  . Dermatophytosis of groin and perianal area 05/31/2008  . DIVERTICULOSIS, COLON 04/06/2007  . Dysuria 01/27/2009  . FLANK PAIN, LEFT 09/30/2007  . Flushing 08/26/2009  . GERD 01/20/2007  . GLUCOSE INTOLERANCE 04/04/2007  . Gross hematuria 01/27/2009  . HEMATOCHEZIA 07/26/2008  . HYPERLIPIDEMIA 01/20/2007  . HYPOGONADISM 06/13/2009  . Impaired glucose tolerance 12/10/2010  . INSOMNIA 04/04/2007  . LIBIDO, DECREASED 12/02/2008  . 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 LOW BACK PAIN 01/20/2007  . OBSTRUCTIVE SLEEP APNEA 04/04/2007  . OTITIS MEDIA, ACUTE, BILATERAL 06/13/2009  . PANCREATITIS, HX OF 01/17/2007  . PSA, INCREASED 11/18/2009  . RASH-NONVESICULAR 08/26/2009  . SINUSITIS- ACUTE-NOS 04/04/2007  . SWEATING 09/30/2007  . THRUSH 04/04/2007  . TONSILLECTOMY AND ADENOIDECTOMY, HX OF 01/17/2007  . URI 07/25/2010  . URINARY RETENTION 01/27/2009  .  VERTIGO 06/13/2009   Past Surgical History:  Procedure Laterality Date  . CHOLECYSTECTOMY  2005  . HEMORRHOID BANDING  2015  . HERNIA REPAIR  1957  . TONSILLECTOMY    . VASECTOMY  1987    reports that he has never smoked. He has never used smokeless tobacco. He reports current alcohol use. He reports that he does not use drugs. family history includes Coronary artery disease in an other family member; Diabetes in an other family member; Emphysema in his father; Heart disease in his father. Allergies  Allergen Reactions  . Atrovent [Ipratropium] Other (See Comments)    Throat swelling  . Pravastatin Sodium     REACTION: nausea  Pt does not recall this reaction   Current Outpatient Medications on File Prior to Visit  Medication Sig Dispense Refill  . aspirin 81 MG EC tablet Take 81 mg by mouth daily.      . Ciclopirox 0.77 % gel USE AS DIRECTED TOPCIALLY 45 g 1  . fluocinonide cream (LIDEX) 6.62 % Apply 1 application topically 2 (two) times daily. 30 g 0  . ibuprofen (ADVIL,MOTRIN) 200 MG tablet 2-3 by mouth once daily as needed for back pain     . lovastatin (MEVACOR) 40 MG tablet Take 1 tablet (40 mg total) by mouth at bedtime. 90 tablet 3  . mometasone (ELOCON) 0.1 % cream Apply 1 application topically daily. 45 g 0  . OLANZapine (ZYPREXA) 5 MG tablet Take 5 mg by mouth at bedtime.    . tamsulosin (FLOMAX) 0.4 MG CAPS capsule Take 1 capsule (0.4 mg total) by mouth daily. 90 capsule  3  . vortioxetine HBr (TRINTELLIX) 10 MG TABS tablet Take 10 mg by mouth daily.    Marland Kitchen zolpidem (AMBIEN) 10 MG tablet TAKE ONE TABLET BY MOUTH EVERY NIGHT AT BEDTIME 30 tablet 5  . escitalopram (LEXAPRO) 10 MG tablet Take 1 tablet (10 mg total) by mouth daily. 90 tablet 3   No current facility-administered medications on file prior to visit.    Review of Systems Constitutional: Negative for other unusual diaphoresis, sweats, appetite or weight changes HENT: Negative for other worsening hearing loss,  ear pain, facial swelling, mouth sores or neck stiffness.   Eyes: Negative for other worsening pain, redness or other visual disturbance.  Respiratory: Negative for other stridor or swelling Cardiovascular: Negative for other palpitations or other chest pain  Gastrointestinal: Negative for worsening diarrhea or loose stools, blood in stool, distention or other pain Genitourinary: Negative for hematuria, flank pain or other change in urine volume.  Musculoskeletal: Negative for myalgias or other joint swelling.  Skin: Negative for other color change, or other wound or worsening drainage.  Neurological: Negative for other syncope or numbness. Hematological: Negative for other adenopathy or swelling Psychiatric/Behavioral: Negative for hallucinations, other worsening agitation, SI, self-injury, or new decreased concentration All other system neg per pt    Objective:   Physical Exam BP 130/82   Pulse (!) 115   Temp 97.8 F (36.6 C) (Oral)   Ht 6\' 1"  (1.854 m)   Wt 227 lb (103 kg)   SpO2 96%   BMI 29.95 kg/m  VS noted,  Constitutional: Pt is oriented to person, place, and time. Appears well-developed and well-nourished, in no significant distress and comfortable Head: Normocephalic and atraumatic  Eyes: Conjunctivae and EOM are normal. Pupils are equal, round, and reactive to light Right Ear: External ear normal without discharge Left Ear: External ear normal without discharge Nose: Nose without discharge or deformity Mouth/Throat: Oropharynx is without other ulcerations and moist  Neck: Normal range of motion. Neck supple. No JVD present. No tracheal deviation present or significant neck LA or mass Cardiovascular: Normal rate, regular rhythm, normal heart sounds and intact distal pulses.   Pulmonary/Chest: WOB normal and breath sounds without rales or wheezing  Abdominal: Soft. Bowel sounds are normal. NT. No HSM  Musculoskeletal: Normal range of motion. Exhibits no  edema Lymphadenopathy: Has no other cervical adenopathy.  Neurological: Pt is alert and oriented to person, place, and time. Pt has normal reflexes. No cranial nerve deficit. Motor grossly intact, Gait intact Skin: Skin is warm and dry. No rash noted or new ulcerations Psychiatric:  Has normal mood and affect. Behavior is normal without agitation No other exam findings Lab Results  Component Value Date   WBC 6.2 07/15/2018   HGB 14.6 07/15/2018   HCT 42.3 07/15/2018   PLT 209.0 07/15/2018   GLUCOSE 187 (H) 07/15/2018   CHOL 132 07/15/2018   TRIG 155.0 (H) 07/15/2018   HDL 40.60 07/15/2018   LDLDIRECT 82.0 02/28/2017   LDLCALC 61 07/15/2018   ALT 18 07/15/2018   AST 17 07/15/2018   NA 140 07/15/2018   K 3.9 07/15/2018   CL 103 07/15/2018   CREATININE 1.03 07/15/2018   BUN 18 07/15/2018   CO2 26 07/15/2018   TSH 1.84 07/15/2018   PSA 3.93 07/15/2018   HGBA1C 7.2 (H) 07/15/2018   MICROALBUR 1.3 07/15/2018       Assessment & Plan:

## 2018-07-21 NOTE — Assessment & Plan Note (Signed)

## 2018-07-22 DIAGNOSIS — F331 Major depressive disorder, recurrent, moderate: Secondary | ICD-10-CM | POA: Diagnosis not present

## 2018-09-18 ENCOUNTER — Other Ambulatory Visit: Payer: Self-pay | Admitting: Internal Medicine

## 2018-09-22 ENCOUNTER — Other Ambulatory Visit: Payer: Self-pay | Admitting: Internal Medicine

## 2018-10-14 DIAGNOSIS — H524 Presbyopia: Secondary | ICD-10-CM | POA: Diagnosis not present

## 2018-10-14 DIAGNOSIS — E119 Type 2 diabetes mellitus without complications: Secondary | ICD-10-CM | POA: Diagnosis not present

## 2018-10-14 DIAGNOSIS — H5203 Hypermetropia, bilateral: Secondary | ICD-10-CM | POA: Diagnosis not present

## 2018-10-14 DIAGNOSIS — H52223 Regular astigmatism, bilateral: Secondary | ICD-10-CM | POA: Diagnosis not present

## 2018-10-14 LAB — HM DIABETES EYE EXAM

## 2018-10-20 ENCOUNTER — Telehealth: Payer: Self-pay | Admitting: *Deleted

## 2018-10-20 MED ORDER — MECLIZINE HCL 25 MG PO TABS: 25.0000 mg | ORAL_TABLET | Freq: Three times a day (TID) | ORAL | 2 refills | Status: DC | PRN

## 2018-10-20 NOTE — Telephone Encounter (Signed)
Copied from [facility] [id number]. Topic: General - Other >> Oct 20, 2018  9:51 AM Leward Quan A wrote: Reason for CRM: Patient wife called to say that her husband is experiencing Vertigo symptoms and was prescribed a medication a while ago by Dr Jenny Reichmann and would like an Rx sent to the pharmacy file again for the Vertigo please. Ph# [phone]

## 2018-10-20 NOTE — Telephone Encounter (Signed)
Pt informed of below.  

## 2018-10-20 NOTE — Telephone Encounter (Signed)
Done erx 

## 2018-11-03 DIAGNOSIS — F411 Generalized anxiety disorder: Secondary | ICD-10-CM | POA: Diagnosis not present

## 2018-11-03 DIAGNOSIS — F332 Major depressive disorder, recurrent severe without psychotic features: Secondary | ICD-10-CM | POA: Diagnosis not present

## 2018-11-03 DIAGNOSIS — F341 Dysthymic disorder: Secondary | ICD-10-CM | POA: Diagnosis not present

## 2018-12-04 DIAGNOSIS — F411 Generalized anxiety disorder: Secondary | ICD-10-CM | POA: Diagnosis not present

## 2018-12-04 DIAGNOSIS — F341 Dysthymic disorder: Secondary | ICD-10-CM | POA: Diagnosis not present

## 2018-12-17 ENCOUNTER — Other Ambulatory Visit: Payer: Self-pay | Admitting: Internal Medicine

## 2018-12-17 NOTE — Telephone Encounter (Signed)
Done erx 

## 2018-12-31 DIAGNOSIS — F3341 Major depressive disorder, recurrent, in partial remission: Secondary | ICD-10-CM | POA: Diagnosis not present

## 2018-12-31 DIAGNOSIS — F331 Major depressive disorder, recurrent, moderate: Secondary | ICD-10-CM | POA: Diagnosis not present

## 2019-01-15 ENCOUNTER — Other Ambulatory Visit (INDEPENDENT_AMBULATORY_CARE_PROVIDER_SITE_OTHER): Payer: PPO

## 2019-01-15 DIAGNOSIS — E1340 Other specified diabetes mellitus with diabetic neuropathy, unspecified: Secondary | ICD-10-CM

## 2019-01-15 LAB — HEPATIC FUNCTION PANEL
ALT: 15 U/L (ref 0–53)
AST: 14 U/L (ref 0–37)
Albumin: 4.6 g/dL (ref 3.5–5.2)
Alkaline Phosphatase: 62 U/L (ref 39–117)
Bilirubin, Direct: 0.3 mg/dL (ref 0.0–0.3)
Total Bilirubin: 1.4 mg/dL — ABNORMAL HIGH (ref 0.2–1.2)
Total Protein: 7.6 g/dL (ref 6.0–8.3)

## 2019-01-15 LAB — BASIC METABOLIC PANEL
BUN: 17 mg/dL (ref 6–23)
CO2: 25 mEq/L (ref 19–32)
Calcium: 9.7 mg/dL (ref 8.4–10.5)
Chloride: 105 mEq/L (ref 96–112)
Creatinine, Ser: 1.11 mg/dL (ref 0.40–1.50)
GFR: 66.01 mL/min (ref >60.00)
Glucose, Bld: 154 mg/dL — ABNORMAL HIGH (ref 70–99)
Potassium: 3.6 mEq/L (ref 3.5–5.1)
Sodium: 140 mEq/L (ref 135–145)

## 2019-01-15 LAB — LIPID PANEL
Cholesterol: 125 mg/dL (ref 0–200)
HDL: 43.7 mg/dL (ref >39.00)
LDL Cholesterol: 58 mg/dL (ref 0–99)
NonHDL: 81.74
Total CHOL/HDL Ratio: 3
Triglycerides: 121 mg/dL (ref 0.0–149.0)
VLDL: 24.2 mg/dL (ref 0.0–40.0)

## 2019-01-15 LAB — HEMOGLOBIN A1C: Hgb A1c MFr Bld: 6.4 % (ref 4.6–6.5)

## 2019-01-21 ENCOUNTER — Encounter: Payer: Self-pay | Admitting: Internal Medicine

## 2019-01-21 ENCOUNTER — Ambulatory Visit (INDEPENDENT_AMBULATORY_CARE_PROVIDER_SITE_OTHER): Payer: PPO | Admitting: Internal Medicine

## 2019-01-21 DIAGNOSIS — E785 Hyperlipidemia, unspecified: Secondary | ICD-10-CM | POA: Diagnosis not present

## 2019-01-21 DIAGNOSIS — E611 Iron deficiency: Secondary | ICD-10-CM | POA: Diagnosis not present

## 2019-01-21 DIAGNOSIS — E538 Deficiency of other specified B group vitamins: Secondary | ICD-10-CM | POA: Diagnosis not present

## 2019-01-21 DIAGNOSIS — Z0000 Encounter for general adult medical examination without abnormal findings: Secondary | ICD-10-CM

## 2019-01-21 DIAGNOSIS — E1340 Other specified diabetes mellitus with diabetic neuropathy, unspecified: Secondary | ICD-10-CM | POA: Diagnosis not present

## 2019-01-21 DIAGNOSIS — F419 Anxiety disorder, unspecified: Secondary | ICD-10-CM

## 2019-01-21 DIAGNOSIS — E559 Vitamin D deficiency, unspecified: Secondary | ICD-10-CM | POA: Diagnosis not present

## 2019-01-21 NOTE — Assessment & Plan Note (Signed)
stable overall by history and exam, recent data reviewed with pt, and pt to continue medical treatment as before,  to f/u any worsening symptoms or concerns  

## 2019-01-21 NOTE — Patient Instructions (Signed)
Please continue all other medications as before, and refills have been done if requested.  Please have the pharmacy call with any other refills you may need.  Please continue your efforts at being more active, low cholesterol diet, and weight control..  Please keep your appointments with your specialists as you may have planned  Please return in 6 months, or sooner if needed, with Lab testing done 3-5 days before  

## 2019-01-21 NOTE — Progress Notes (Signed)
Patient ID: Paul Tucker, male   DOB: [birth year], 68 y.o.   MRN: [id number]  Virtual Visit via Video Note  I connected with Paul Tucker on 01/21/19 at  1:20 PM EDT by a video enabled telemedicine application and verified that I am speaking with the correct person using two identifiers.  Location: Patient: at home Provider: at office   I discussed the limitations of evaluation and management by telemedicine and the availability of in person appointments. The patient expressed understanding and agreed to proceed.  History of Present Illness: Here to f/u; overall doing ok,  Pt denies chest pain, increasing sob or doe, wheezing, orthopnea, PND, increased LE swelling, palpitations, dizziness or syncope.  Pt denies new neurological symptoms such as new headache, or facial or extremity weakness or numbness.  Pt denies polydipsia, polyuria, or low sugar episode.  Pt states overall good compliance with meds, mostly trying to follow appropriate diet, with wt overall stable,  but little exercise however.  Denies worsening depressive symptoms, suicidal ideation, or panic; has ongoing anxiety, stable per pt, still seeing Paul Tucker Past Medical History:  Diagnosis Date  . Acute prostatitis 04/04/2007  . ADD 01/17/2007  . DEPRESSION 01/17/2007  . Dermatophytosis of groin and perianal area 05/31/2008  . DIVERTICULOSIS, COLON 04/06/2007  . Dysuria 01/27/2009  . FLANK PAIN, LEFT 09/30/2007  . Flushing 08/26/2009  . GERD 01/20/2007  . GLUCOSE INTOLERANCE 04/04/2007  . Gross hematuria 01/27/2009  . HEMATOCHEZIA 07/26/2008  . HYPERLIPIDEMIA 01/20/2007  . HYPOGONADISM 06/13/2009  . Impaired glucose tolerance 12/10/2010  . INSOMNIA 04/04/2007  . LIBIDO, DECREASED 12/02/2008  . 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 LOW BACK PAIN 01/20/2007  . OBSTRUCTIVE SLEEP APNEA 04/04/2007  . OTITIS MEDIA, ACUTE, BILATERAL 06/13/2009  . PANCREATITIS, HX OF 01/17/2007  . PSA, INCREASED 11/18/2009  . RASH-NONVESICULAR 08/26/2009  . SINUSITIS-  ACUTE-NOS 04/04/2007  . SWEATING 09/30/2007  . THRUSH 04/04/2007  . TONSILLECTOMY AND ADENOIDECTOMY, HX OF 01/17/2007  . URI 07/25/2010  . URINARY RETENTION 01/27/2009  . VERTIGO 06/13/2009   Past Surgical History:  Procedure Laterality Date  . CHOLECYSTECTOMY  2005  . HEMORRHOID BANDING  2015  . HERNIA REPAIR  1957  . TONSILLECTOMY    . VASECTOMY  1987    reports that he has never smoked. He has never used smokeless tobacco. He reports current alcohol use. He reports that he does not use drugs. family history includes Coronary artery disease in an other family member; Diabetes in an other family member; Emphysema in his father; Heart disease in his father. Allergies  Allergen Reactions  . Atrovent [Ipratropium] Other (See Comments)    Throat swelling  . Pravastatin Sodium     REACTION: nausea  Pt does not recall this reaction   Current Outpatient Medications on File Prior to Visit  Medication Sig Dispense Refill  . aspirin 81 MG EC tablet Take 81 mg by mouth daily.      . Ciclopirox 0.77 % gel USE AS DIRECTED TOPCIALLY 45 g 1  . escitalopram (LEXAPRO) 10 MG tablet Take 1 tablet (10 mg total) by mouth daily. 90 tablet 3  . fluocinonide cream (LIDEX) [id number] % Apply 1 application topically 2 (two) times daily. 30 g 0  . ibuprofen (ADVIL,MOTRIN) 200 MG tablet 2-3 by mouth once daily as needed for back pain     . lovastatin (MEVACOR) 40 MG tablet TAKE ONE TABLET BY MOUTH AT BEDTIME 90 tablet 2  . meclizine (ANTIVERT) 25 MG tablet  Take 1 tablet (25 mg total) by mouth 3 (three) times daily as needed for dizziness. 30 tablet 2  . mometasone (ELOCON) 0.1 % cream Apply 1 application topically daily. 45 g 0  . OLANZapine (ZYPREXA) 5 MG tablet Take 5 mg by mouth at bedtime.    . tamsulosin (FLOMAX) 0.4 MG CAPS capsule TAKE ONE CAPSULE BY MOUTH DAILY 90 capsule 2  . vortioxetine HBr (TRINTELLIX) 10 MG TABS tablet Take 10 mg by mouth daily.    Marland Kitchen zolpidem (AMBIEN) 10 MG tablet TAKE ONE TABLET BY  MOUTH EVERY NIGHT AT BEDTIME 30 tablet 4   No current facility-administered medications on file prior to visit.     Observations/Objective: Alert, NAD, appropriate mood and affect, resps normal, cn 2-12 intact, moves all 4s, no visible rash or swelling Lab Results  Component Value Date   WBC 6.2 07/15/2018   HGB 14.6 07/15/2018   HCT 42.3 07/15/2018   PLT 209.0 07/15/2018   GLUCOSE 154 (H) 01/15/2019   CHOL 125 01/15/2019   TRIG 121.0 01/15/2019   HDL 43.70 01/15/2019   LDLDIRECT 82.0 02/28/2017   LDLCALC 58 01/15/2019   ALT 15 01/15/2019   AST 14 01/15/2019   NA 140 01/15/2019   K 3.6 01/15/2019   CL 105 01/15/2019   CREATININE 1.11 01/15/2019   BUN 17 01/15/2019   CO2 25 01/15/2019   TSH 1.84 07/15/2018   PSA 3.93 07/15/2018   HGBA1C 6.4 01/15/2019   MICROALBUR 1.3 07/15/2018    Assessment and Plan: see notes  Follow Up Instructions: See notes   I discussed the assessment and treatment plan with the patient. The patient was provided an opportunity to ask questions and all were answered. The patient agreed with the plan and demonstrated an understanding of the instructions.   The patient was advised to call back or seek an in-person evaluation if the symptoms worsen or if the condition fails to improve as anticipated.  Cathlean Cower, MD

## 2019-01-30 DIAGNOSIS — D2262 Melanocytic nevi of left upper limb, including shoulder: Secondary | ICD-10-CM | POA: Diagnosis not present

## 2019-01-30 DIAGNOSIS — L821 Other seborrheic keratosis: Secondary | ICD-10-CM | POA: Diagnosis not present

## 2019-01-30 DIAGNOSIS — D225 Melanocytic nevi of trunk: Secondary | ICD-10-CM | POA: Diagnosis not present

## 2019-01-30 DIAGNOSIS — D1801 Hemangioma of skin and subcutaneous tissue: Secondary | ICD-10-CM | POA: Diagnosis not present

## 2019-02-04 DIAGNOSIS — F332 Major depressive disorder, recurrent severe without psychotic features: Secondary | ICD-10-CM | POA: Diagnosis not present

## 2019-02-28 ENCOUNTER — Ambulatory Visit (INDEPENDENT_AMBULATORY_CARE_PROVIDER_SITE_OTHER): Payer: PPO

## 2019-02-28 ENCOUNTER — Other Ambulatory Visit: Payer: Self-pay

## 2019-02-28 DIAGNOSIS — Z23 Encounter for immunization: Secondary | ICD-10-CM | POA: Diagnosis not present

## 2019-03-03 DIAGNOSIS — F3341 Major depressive disorder, recurrent, in partial remission: Secondary | ICD-10-CM | POA: Diagnosis not present

## 2019-04-03 DIAGNOSIS — F3341 Major depressive disorder, recurrent, in partial remission: Secondary | ICD-10-CM | POA: Diagnosis not present

## 2019-04-30 DIAGNOSIS — F3341 Major depressive disorder, recurrent, in partial remission: Secondary | ICD-10-CM | POA: Diagnosis not present

## 2019-05-17 ENCOUNTER — Other Ambulatory Visit: Payer: Self-pay | Admitting: Internal Medicine

## 2019-05-17 NOTE — Telephone Encounter (Signed)
Done erx 

## 2019-06-01 DIAGNOSIS — M25512 Pain in left shoulder: Secondary | ICD-10-CM | POA: Diagnosis not present

## 2019-06-09 ENCOUNTER — Other Ambulatory Visit: Payer: PPO

## 2019-06-16 ENCOUNTER — Ambulatory Visit: Payer: PPO

## 2019-06-17 ENCOUNTER — Other Ambulatory Visit: Payer: Self-pay | Admitting: Internal Medicine

## 2019-06-18 ENCOUNTER — Other Ambulatory Visit: Payer: Self-pay | Admitting: Internal Medicine

## 2019-06-25 ENCOUNTER — Ambulatory Visit: Payer: PPO | Attending: Internal Medicine

## 2019-06-25 DIAGNOSIS — Z23 Encounter for immunization: Secondary | ICD-10-CM | POA: Insufficient documentation

## 2019-06-25 NOTE — Progress Notes (Signed)
   Covid-19 Vaccination Clinic  Name:  Paul Tucker    MRN: [id number] DOB: 09-Nov-1950  06/25/2019  [name] was observed post Covid-19 immunization for 15 minutes without incidence. He was provided with Vaccine Information Sheet and instruction to access the V-Safe system.   [name] was instructed to call 911 with any severe reactions post vaccine: Marland Kitchen Difficulty breathing  . Swelling of your face and throat  . A fast heartbeat  . A bad rash all over your body  . Dizziness and weakness    Immunizations Administered    Name Date Dose VIS Date Route   Pfizer COVID-19 Vaccine 06/25/2019 10:27 AM 0.3 mL 05/01/2019 Intramuscular   Manufacturer: [place]   Lot: [id number]   [place]: [id number]

## 2019-07-01 DIAGNOSIS — F3341 Major depressive disorder, recurrent, in partial remission: Secondary | ICD-10-CM | POA: Diagnosis not present

## 2019-07-01 DIAGNOSIS — F3342 Major depressive disorder, recurrent, in full remission: Secondary | ICD-10-CM | POA: Diagnosis not present

## 2019-07-14 ENCOUNTER — Other Ambulatory Visit (INDEPENDENT_AMBULATORY_CARE_PROVIDER_SITE_OTHER): Payer: PPO

## 2019-07-14 DIAGNOSIS — E1340 Other specified diabetes mellitus with diabetic neuropathy, unspecified: Secondary | ICD-10-CM | POA: Diagnosis not present

## 2019-07-14 DIAGNOSIS — E611 Iron deficiency: Secondary | ICD-10-CM | POA: Diagnosis not present

## 2019-07-14 DIAGNOSIS — Z0000 Encounter for general adult medical examination without abnormal findings: Secondary | ICD-10-CM

## 2019-07-14 DIAGNOSIS — E559 Vitamin D deficiency, unspecified: Secondary | ICD-10-CM | POA: Diagnosis not present

## 2019-07-14 DIAGNOSIS — E538 Deficiency of other specified B group vitamins: Secondary | ICD-10-CM

## 2019-07-14 LAB — URINALYSIS, ROUTINE W REFLEX MICROSCOPIC
Bilirubin Urine: NEGATIVE
Ketones, ur: NEGATIVE
Leukocytes,Ua: NEGATIVE
Nitrite: NEGATIVE
Specific Gravity, Urine: 1.025 (ref 1.000–1.030)
Total Protein, Urine: NEGATIVE
Urine Glucose: NEGATIVE
Urobilinogen, UA: 0.2 (ref 0.0–1.0)
pH: 6 (ref 5.0–8.0)

## 2019-07-14 LAB — BASIC METABOLIC PANEL
BUN: 13 mg/dL (ref 6–23)
CO2: 27 mEq/L (ref 19–32)
Calcium: 9.7 mg/dL (ref 8.4–10.5)
Chloride: 103 mEq/L (ref 96–112)
Creatinine, Ser: 1.05 mg/dL (ref 0.40–1.50)
GFR: 70.28 mL/min (ref >60.00)
Glucose, Bld: 215 mg/dL — ABNORMAL HIGH (ref 70–99)
Potassium: 3.7 mEq/L (ref 3.5–5.1)
Sodium: 138 mEq/L (ref 135–145)

## 2019-07-14 LAB — CBC WITH DIFFERENTIAL/PLATELET
Basophils Absolute: 0 10*3/uL (ref 0.0–0.1)
Basophils Relative: 0.4 % (ref 0.0–3.0)
Eosinophils Absolute: 0.3 10*3/uL (ref 0.0–0.7)
Eosinophils Relative: 4 % (ref 0.0–5.0)
HCT: 39.2 % (ref 39.0–52.0)
Hemoglobin: 13.6 g/dL (ref 13.0–17.0)
Lymphocytes Relative: 21.5 % (ref 12.0–46.0)
Lymphs Abs: 1.6 10*3/uL (ref 0.7–4.0)
MCHC: 34.8 g/dL (ref 30.0–36.0)
MCV: 96.5 fl (ref 78.0–100.0)
Monocytes Absolute: 0.5 10*3/uL (ref 0.1–1.0)
Monocytes Relative: 6.7 % (ref 3.0–12.0)
Neutro Abs: 5 10*3/uL (ref 1.4–7.7)
Neutrophils Relative %: 67.4 % (ref 43.0–77.0)
Platelets: 221 10*3/uL (ref 150.0–400.0)
RBC: 4.06 Mil/uL — ABNORMAL LOW (ref 4.22–5.81)
RDW: 12.6 % (ref 11.5–15.5)
WBC: 7.4 10*3/uL (ref 4.0–10.5)

## 2019-07-14 LAB — HEPATIC FUNCTION PANEL
ALT: 15 U/L (ref 0–53)
AST: 13 U/L (ref 0–37)
Albumin: 4.6 g/dL (ref 3.5–5.2)
Alkaline Phosphatase: 69 U/L (ref 39–117)
Bilirubin, Direct: 0.3 mg/dL (ref 0.0–0.3)
Total Bilirubin: 1.4 mg/dL — ABNORMAL HIGH (ref 0.2–1.2)
Total Protein: 7.3 g/dL (ref 6.0–8.3)

## 2019-07-14 LAB — LIPID PANEL
Cholesterol: 138 mg/dL (ref 0–200)
HDL: 43.3 mg/dL (ref >39.00)
LDL Cholesterol: 66 mg/dL (ref 0–99)
NonHDL: 94.28
Total CHOL/HDL Ratio: 3
Triglycerides: 141 mg/dL (ref 0.0–149.0)
VLDL: 28.2 mg/dL (ref 0.0–40.0)

## 2019-07-14 LAB — MICROALBUMIN / CREATININE URINE RATIO
Creatinine,U: 194 mg/dL
Microalb Creat Ratio: 0.9 mg/g (ref 0.0–30.0)
Microalb, Ur: 1.8 mg/dL (ref 0.0–1.9)

## 2019-07-14 LAB — IBC PANEL
Iron: 146 ug/dL (ref 42–165)
Saturation Ratios: 54.3 % — ABNORMAL HIGH (ref 20.0–50.0)
Transferrin: 192 mg/dL — ABNORMAL LOW (ref 212.0–360.0)

## 2019-07-14 LAB — HEMOGLOBIN A1C: Hgb A1c MFr Bld: 7 % — ABNORMAL HIGH (ref 4.6–6.5)

## 2019-07-14 LAB — VITAMIN B12: Vitamin B-12: 237 pg/mL (ref 211–911)

## 2019-07-14 LAB — VITAMIN D 25 HYDROXY (VIT D DEFICIENCY, FRACTURES): VITD: 25.03 ng/mL — ABNORMAL LOW (ref 30.00–100.00)

## 2019-07-14 LAB — TSH: TSH: 3.27 u[IU]/mL (ref 0.35–4.50)

## 2019-07-14 LAB — PSA: PSA: 4.63 ng/mL — ABNORMAL HIGH (ref 0.10–4.00)

## 2019-07-20 ENCOUNTER — Ambulatory Visit: Payer: PPO | Attending: Internal Medicine

## 2019-07-20 DIAGNOSIS — Z23 Encounter for immunization: Secondary | ICD-10-CM

## 2019-07-20 NOTE — Progress Notes (Signed)
   Covid-19 Vaccination Clinic  Name:  Paul Tucker    MRN: [id number] DOB: 1950-09-03  07/20/2019  Mr. Kerby was observed post Covid-19 immunization for 15 minutes without incidence. He was provided with Vaccine Information Sheet and instruction to access the V-Safe system.   Mr. Leve was instructed to call 911 with any severe reactions post vaccine: Marland Kitchen Difficulty breathing  . Swelling of your face and throat  . A fast heartbeat  . A bad rash all over your body  . Dizziness and weakness    Immunizations Administered    Name Date Dose VIS Date Route   Pfizer COVID-19 Vaccine 07/20/2019  2:58 PM 0.3 mL 05/01/2019 Intramuscular   Manufacturer: [place]   Lot: [id number]   [place]: [id number]

## 2019-07-23 ENCOUNTER — Encounter: Payer: Self-pay | Admitting: Internal Medicine

## 2019-07-23 ENCOUNTER — Other Ambulatory Visit: Payer: Self-pay

## 2019-07-23 ENCOUNTER — Ambulatory Visit (INDEPENDENT_AMBULATORY_CARE_PROVIDER_SITE_OTHER): Payer: PPO | Admitting: Internal Medicine

## 2019-07-23 DIAGNOSIS — E785 Hyperlipidemia, unspecified: Secondary | ICD-10-CM | POA: Diagnosis not present

## 2019-07-23 DIAGNOSIS — E538 Deficiency of other specified B group vitamins: Secondary | ICD-10-CM | POA: Diagnosis not present

## 2019-07-23 DIAGNOSIS — Z0001 Encounter for general adult medical examination with abnormal findings: Secondary | ICD-10-CM

## 2019-07-23 DIAGNOSIS — Z0000 Encounter for general adult medical examination without abnormal findings: Secondary | ICD-10-CM

## 2019-07-23 DIAGNOSIS — E559 Vitamin D deficiency, unspecified: Secondary | ICD-10-CM

## 2019-07-23 DIAGNOSIS — E1340 Other specified diabetes mellitus with diabetic neuropathy, unspecified: Secondary | ICD-10-CM

## 2019-07-23 DIAGNOSIS — R8290 Unspecified abnormal findings in urine: Secondary | ICD-10-CM | POA: Diagnosis not present

## 2019-07-23 DIAGNOSIS — R972 Elevated prostate specific antigen [PSA]: Secondary | ICD-10-CM | POA: Insufficient documentation

## 2019-07-23 MED ORDER — DOXYCYCLINE HYCLATE 100 MG PO TABS: 100.0000 mg | ORAL_TABLET | Freq: Two times a day (BID) | ORAL | 0 refills | Status: DC

## 2019-07-23 MED ORDER — VITAMIN D (ERGOCALCIFEROL) 1.25 MG (50000 UNIT) PO CAPS: 50000.0000 [IU] | ORAL_CAPSULE | ORAL | 0 refills | Status: DC

## 2019-07-23 NOTE — Progress Notes (Signed)
Subjective:    Patient ID: Paul Tucker, male    DOB: 01-06-1951, 69 y.o.   MRN: [id number]  HPI  Here for wellness and f/u;  Overall doing ok;  Pt denies Chest pain, worsening SOB, DOE, wheezing, orthopnea, PND, worsening LE edema, palpitations, dizziness or syncope.  Pt denies neurological change such as new headache, facial or extremity weakness.  Pt denies polydipsia, polyuria, or low sugar symptoms. Pt states overall good compliance with treatment and medications, good tolerability, and has been trying to follow appropriate diet.  Pt denies worsening depressive symptoms, suicidal ideation or panic. No fever, night sweats, wt loss, loss of appetite, or other constitutional symptoms.  Pt states good ability with ADL's, has low fall risk, home safety reviewed and adequate, no other significant changes in hearing or vision, and only occasionally active with exercise. Seeing Dr [facility] for depression Already taking vit d 2000 per day.  Denies urinary symptoms such as dysuria, frequency, urgency, flank pain, hematuria or n/v, fever, chills, but has had some unusual brown urinary leakage at night several nights recently.   Past Medical History:  Diagnosis Date  . Acute prostatitis 04/04/2007  . ADD 01/17/2007  . DEPRESSION 01/17/2007  . Dermatophytosis of groin and perianal area 05/31/2008  . DIVERTICULOSIS, COLON 04/06/2007  . Dysuria 01/27/2009  . FLANK PAIN, LEFT 09/30/2007  . Flushing 08/26/2009  . GERD 01/20/2007  . GLUCOSE INTOLERANCE 04/04/2007  . Gross hematuria 01/27/2009  . HEMATOCHEZIA 07/26/2008  . HYPERLIPIDEMIA 01/20/2007  . HYPOGONADISM 06/13/2009  . Impaired glucose tolerance 12/10/2010  . INSOMNIA 04/04/2007  . LIBIDO, DECREASED 12/02/2008  . 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 LOW BACK PAIN 01/20/2007  . OBSTRUCTIVE SLEEP APNEA 04/04/2007  . OTITIS MEDIA, ACUTE, BILATERAL 06/13/2009  . PANCREATITIS, HX OF 01/17/2007  . PSA, INCREASED 11/18/2009  . RASH-NONVESICULAR 08/26/2009  . SINUSITIS-  ACUTE-NOS 04/04/2007  . SWEATING 09/30/2007  . THRUSH 04/04/2007  . TONSILLECTOMY AND ADENOIDECTOMY, HX OF 01/17/2007  . URI 07/25/2010  . URINARY RETENTION 01/27/2009  . VERTIGO 06/13/2009   Past Surgical History:  Procedure Laterality Date  . CHOLECYSTECTOMY  2005  . HEMORRHOID BANDING  2015  . HERNIA REPAIR  1957  . TONSILLECTOMY    . VASECTOMY  1987    reports that he has never smoked. He has never used smokeless tobacco. He reports current alcohol use. He reports that he does not use drugs. family history includes Coronary artery disease in an other family member; Diabetes in an other family member; Emphysema in his father; Heart disease in his father. Allergies  Allergen Reactions  . Atrovent [Ipratropium] Other (See Comments)    Throat swelling  . Pravastatin Sodium     REACTION: nausea  Pt does not recall this reaction   Current Outpatient Medications on File Prior to Visit  Medication Sig Dispense Refill  . aspirin 81 MG EC tablet Take 81 mg by mouth daily.      . Ciclopirox 0.77 % gel USE AS DIRECTED TOPCIALLY 45 g 1  . fluocinonide cream (LIDEX) [id number] % Apply 1 application topically 2 (two) times daily. 30 g 0  . ibuprofen (ADVIL,MOTRIN) 200 MG tablet 2-3 by mouth once daily as needed for back pain     . lovastatin (MEVACOR) 40 MG tablet Take 1 tablet (40 mg total) by mouth at bedtime. Annual appt due in March must see provider for future refills 90 tablet 0  . meclizine (ANTIVERT) 25 MG tablet Take 1 tablet (25  mg total) by mouth 3 (three) times daily as needed for dizziness. 30 tablet 2  . mometasone (ELOCON) 0.1 % cream Apply 1 application topically daily. 45 g 0  . tamsulosin (FLOMAX) 0.4 MG CAPS capsule Take 1 capsule (0.4 mg total) by mouth daily. Annual appt due in March must see provider for future refills 90 capsule 0  . zolpidem (AMBIEN) 10 MG tablet TAKE ONE TABLET BY MOUTH EVERY NIGHT AT BEDTIME 90 tablet 1  . escitalopram (LEXAPRO) 10 MG tablet Take 1 tablet (10  mg total) by mouth daily. 90 tablet 3  . OLANZapine (ZYPREXA) 5 MG tablet Take 5 mg by mouth at bedtime.    . vortioxetine HBr (TRINTELLIX) 10 MG TABS tablet Take 10 mg by mouth daily.     No current facility-administered medications on file prior to visit.   Review of Systems All otherwise neg per pt     Objective:   Physical Exam There were no vitals taken for this visit. VS noted,  Constitutional: Pt appears in NAD HENT: Head: NCAT.  Right Ear: External ear normal.  Left Ear: External ear normal.  Eyes: . Pupils are equal, round, and reactive to light. Conjunctivae and EOM are normal Nose: without d/c or deformity Neck: Neck supple. Gross normal ROM Cardiovascular: Normal rate and regular rhythm.   Pulmonary/Chest: Effort normal and breath sounds without rales or wheezing.  Abd:  Soft, NT, ND, + BS, no organomegaly Neurological: Pt is alert. At baseline orientation, motor grossly intact Skin: Skin is warm. No rashes, other new lesions, no LE edema Psychiatric: Pt behavior is normal without agitation  All otherwise neg per pt Lab Results  Component Value Date   WBC 7.4 07/14/2019   HGB 13.6 07/14/2019   HCT 39.2 07/14/2019   PLT 221.0 07/14/2019   GLUCOSE 215 (H) 07/14/2019   CHOL 138 07/14/2019   TRIG 141.0 07/14/2019   HDL 43.30 07/14/2019   LDLDIRECT 82.0 02/28/2017   LDLCALC 66 07/14/2019   ALT 15 07/14/2019   AST 13 07/14/2019   NA 138 07/14/2019   K 3.7 07/14/2019   CL 103 07/14/2019   CREATININE 1.05 07/14/2019   BUN 13 07/14/2019   CO2 27 07/14/2019   TSH 3.27 07/14/2019   PSA 4.63 (H) 07/14/2019   HGBA1C 7.0 (H) 07/14/2019   MICROALBUR 1.8 07/14/2019      Assessment & Plan:

## 2019-07-23 NOTE — Patient Instructions (Addendum)
Please take all new medication as prescribed - the antibiotic for 4 weeks  Please go to the LAB at the blood drawing area for the tests to be done in 4 weeks after the antibiotic (for URINE and PSA only)  Please take Vitamin D 50000 units weekly for 12 weeks, then plan to change to OTC Vitamin D3 at 5000 units per day, indefinitely.  Please continue all other medications as before, and refills have been done if requested.  Please have the pharmacy call with any other refills you may need.  Please continue your efforts at being more active, low cholesterol diet, and weight control.  You are otherwise up to date with prevention measures today.  Please keep your appointments with your specialists as you may have planned  Please make an Appointment to return in 6 months, or sooner if needed, also with Lab Appointment for testing done 3-5 days before at the [facility] (so this is for TWO appointments - please see the scheduling desk as you leave)

## 2019-07-25 ENCOUNTER — Encounter: Payer: Self-pay | Admitting: Internal Medicine

## 2019-07-25 DIAGNOSIS — E559 Vitamin D deficiency, unspecified: Secondary | ICD-10-CM | POA: Insufficient documentation

## 2019-07-25 NOTE — Assessment & Plan Note (Addendum)
For f/u UA r/o hematauria  I spent 30 minutes in preparing to see the patient by review of recent labs, imaging and procedures, obtaining and reviewing separately obtained history, communicating with the patient and family or caregiver, ordering medications, tests or procedures, and documenting clinical information in the EHR including the differential Dx, treatment, and any further evaluation and other management of abnormal UA, elevated PSA, Vit D deficiency, DM , HLD

## 2019-07-25 NOTE — Assessment & Plan Note (Signed)

## 2019-07-25 NOTE — Assessment & Plan Note (Signed)
stable overall by history and exam, recent data reviewed with pt, and pt to continue medical treatment as before,  to f/u any worsening symptoms or concerns  

## 2019-07-25 NOTE — Assessment & Plan Note (Signed)
To increase the vit d to 5000 u qd

## 2019-07-25 NOTE — Assessment & Plan Note (Signed)
For replacement 

## 2019-07-25 NOTE — Assessment & Plan Note (Addendum)
For f/u psa after 4 wks doxy course,,  to f/u any worsening symptoms or concerns

## 2019-07-27 ENCOUNTER — Telehealth: Payer: Self-pay

## 2019-07-27 ENCOUNTER — Encounter: Payer: Self-pay | Admitting: Internal Medicine

## 2019-07-27 NOTE — Telephone Encounter (Signed)
Patient is requesting a call back, He has questions because of what the AVS states. He states this appointment is wrong from him because it states 30days after the RX is finished. He did not want to change the lab appointment until they spoke to [place].

## 2019-07-27 NOTE — Telephone Encounter (Signed)
Pt to change appt to 30 days out from April Appt.

## 2019-07-27 NOTE — Telephone Encounter (Signed)
Called LVM for lab visit scheduled for 08-24-2019 @8 :30

## 2019-07-28 NOTE — Telephone Encounter (Signed)
PCP sent a message directly to patient.

## 2019-08-24 ENCOUNTER — Encounter: Payer: Self-pay | Admitting: Internal Medicine

## 2019-08-24 ENCOUNTER — Other Ambulatory Visit: Payer: Self-pay

## 2019-08-24 ENCOUNTER — Other Ambulatory Visit (INDEPENDENT_AMBULATORY_CARE_PROVIDER_SITE_OTHER): Payer: PPO

## 2019-08-24 DIAGNOSIS — E538 Deficiency of other specified B group vitamins: Secondary | ICD-10-CM

## 2019-08-24 DIAGNOSIS — E1340 Other specified diabetes mellitus with diabetic neuropathy, unspecified: Secondary | ICD-10-CM | POA: Diagnosis not present

## 2019-08-24 DIAGNOSIS — R972 Elevated prostate specific antigen [PSA]: Secondary | ICD-10-CM | POA: Diagnosis not present

## 2019-08-24 DIAGNOSIS — E559 Vitamin D deficiency, unspecified: Secondary | ICD-10-CM | POA: Diagnosis not present

## 2019-08-24 DIAGNOSIS — R8290 Unspecified abnormal findings in urine: Secondary | ICD-10-CM | POA: Diagnosis not present

## 2019-08-24 LAB — HEPATIC FUNCTION PANEL
ALT: 22 U/L (ref 0–53)
AST: 19 U/L (ref 0–37)
Albumin: 4.5 g/dL (ref 3.5–5.2)
Alkaline Phosphatase: 69 U/L (ref 39–117)
Bilirubin, Direct: 0.2 mg/dL (ref 0.0–0.3)
Total Bilirubin: 1 mg/dL (ref 0.2–1.2)
Total Protein: 7 g/dL (ref 6.0–8.3)

## 2019-08-24 LAB — LIPID PANEL
Cholesterol: 127 mg/dL (ref 0–200)
HDL: 45.5 mg/dL (ref >39.00)
LDL Cholesterol: 58 mg/dL (ref 0–99)
NonHDL: 81.71
Total CHOL/HDL Ratio: 3
Triglycerides: 118 mg/dL (ref 0.0–149.0)
VLDL: 23.6 mg/dL (ref 0.0–40.0)

## 2019-08-24 LAB — URINALYSIS, ROUTINE W REFLEX MICROSCOPIC
Bilirubin Urine: NEGATIVE
Hgb urine dipstick: NEGATIVE
Ketones, ur: NEGATIVE
Leukocytes,Ua: NEGATIVE
Nitrite: NEGATIVE
RBC / HPF: NONE SEEN (ref 0–?)
Specific Gravity, Urine: 1.025 (ref 1.000–1.030)
Total Protein, Urine: NEGATIVE
Urine Glucose: NEGATIVE
Urobilinogen, UA: 0.2 (ref 0.0–1.0)
WBC, UA: NONE SEEN (ref 0–?)
pH: 5.5 (ref 5.0–8.0)

## 2019-08-24 LAB — BASIC METABOLIC PANEL
BUN: 16 mg/dL (ref 6–23)
CO2: 25 mEq/L (ref 19–32)
Calcium: 9.4 mg/dL (ref 8.4–10.5)
Chloride: 105 mEq/L (ref 96–112)
Creatinine, Ser: 1.08 mg/dL (ref 0.40–1.50)
GFR: 68.01 mL/min (ref >60.00)
Glucose, Bld: 172 mg/dL — ABNORMAL HIGH (ref 70–99)
Potassium: 3.7 mEq/L (ref 3.5–5.1)
Sodium: 140 mEq/L (ref 135–145)

## 2019-08-24 LAB — VITAMIN D 25 HYDROXY (VIT D DEFICIENCY, FRACTURES): VITD: 36.31 ng/mL (ref 30.00–100.00)

## 2019-08-24 LAB — HEMOGLOBIN A1C: Hgb A1c MFr Bld: 6.4 % (ref 4.6–6.5)

## 2019-08-24 LAB — PSA: PSA: 4.19 ng/mL — ABNORMAL HIGH (ref 0.10–4.00)

## 2019-08-24 LAB — VITAMIN B12: Vitamin B-12: 229 pg/mL (ref 211–911)

## 2019-09-14 ENCOUNTER — Other Ambulatory Visit: Payer: Self-pay | Admitting: Internal Medicine

## 2019-09-30 DIAGNOSIS — F3342 Major depressive disorder, recurrent, in full remission: Secondary | ICD-10-CM | POA: Diagnosis not present

## 2019-10-12 DIAGNOSIS — R3912 Poor urinary stream: Secondary | ICD-10-CM | POA: Diagnosis not present

## 2019-10-12 DIAGNOSIS — R972 Elevated prostate specific antigen [PSA]: Secondary | ICD-10-CM | POA: Diagnosis not present

## 2019-10-12 DIAGNOSIS — R310 Gross hematuria: Secondary | ICD-10-CM | POA: Diagnosis not present

## 2019-10-12 DIAGNOSIS — N401 Enlarged prostate with lower urinary tract symptoms: Secondary | ICD-10-CM | POA: Diagnosis not present

## 2019-10-21 DIAGNOSIS — R310 Gross hematuria: Secondary | ICD-10-CM | POA: Diagnosis not present

## 2019-10-21 DIAGNOSIS — N281 Cyst of kidney, acquired: Secondary | ICD-10-CM | POA: Diagnosis not present

## 2019-11-09 ENCOUNTER — Other Ambulatory Visit: Payer: Self-pay | Admitting: Internal Medicine

## 2019-11-09 NOTE — Telephone Encounter (Signed)
Donee erx 

## 2019-11-25 DIAGNOSIS — F331 Major depressive disorder, recurrent, moderate: Secondary | ICD-10-CM | POA: Diagnosis not present

## 2019-12-17 DIAGNOSIS — R310 Gross hematuria: Secondary | ICD-10-CM | POA: Diagnosis not present

## 2019-12-17 DIAGNOSIS — R972 Elevated prostate specific antigen [PSA]: Secondary | ICD-10-CM | POA: Diagnosis not present

## 2019-12-25 ENCOUNTER — Other Ambulatory Visit: Payer: Self-pay | Admitting: Internal Medicine

## 2020-01-08 DIAGNOSIS — F3341 Major depressive disorder, recurrent, in partial remission: Secondary | ICD-10-CM | POA: Diagnosis not present

## 2020-01-20 ENCOUNTER — Other Ambulatory Visit: Payer: PPO

## 2020-01-20 ENCOUNTER — Other Ambulatory Visit: Payer: Self-pay | Admitting: Internal Medicine

## 2020-01-20 ENCOUNTER — Other Ambulatory Visit: Payer: Self-pay

## 2020-01-20 DIAGNOSIS — R972 Elevated prostate specific antigen [PSA]: Secondary | ICD-10-CM

## 2020-01-21 ENCOUNTER — Encounter: Payer: Self-pay | Admitting: Internal Medicine

## 2020-01-21 ENCOUNTER — Other Ambulatory Visit: Payer: Self-pay | Admitting: Internal Medicine

## 2020-01-21 ENCOUNTER — Telehealth: Payer: Self-pay | Admitting: Internal Medicine

## 2020-01-21 DIAGNOSIS — E1340 Other specified diabetes mellitus with diabetic neuropathy, unspecified: Secondary | ICD-10-CM

## 2020-01-21 LAB — PSA: PSA: 3.4 ng/mL (ref <=4.0)

## 2020-01-21 NOTE — Telephone Encounter (Signed)
Patient has appointment on Tuesday 01/26/20, needs the following lab orders entered:  Blood sugar Thyroid Full set of blood work  Please call the patient to let him know so he can go get labs...either at [phone] [place] or his wife Bethena Roys @ [phone]

## 2020-01-22 ENCOUNTER — Other Ambulatory Visit (INDEPENDENT_AMBULATORY_CARE_PROVIDER_SITE_OTHER): Payer: PPO

## 2020-01-22 DIAGNOSIS — E1340 Other specified diabetes mellitus with diabetic neuropathy, unspecified: Secondary | ICD-10-CM | POA: Diagnosis not present

## 2020-01-22 LAB — HEMOGLOBIN A1C: Hgb A1c MFr Bld: 5.7 % (ref 4.6–6.5)

## 2020-01-22 LAB — LIPID PANEL
Cholesterol: 117 mg/dL (ref 0–200)
HDL: 40.6 mg/dL (ref >39.00)
LDL Cholesterol: 55 mg/dL (ref 0–99)
NonHDL: 76.65
Total CHOL/HDL Ratio: 3
Triglycerides: 110 mg/dL (ref 0.0–149.0)
VLDL: 22 mg/dL (ref 0.0–40.0)

## 2020-01-22 LAB — COMPREHENSIVE METABOLIC PANEL
ALT: 16 U/L (ref 0–53)
AST: 15 U/L (ref 0–37)
Albumin: 4.6 g/dL (ref 3.5–5.2)
Alkaline Phosphatase: 61 U/L (ref 39–117)
BUN: 16 mg/dL (ref 6–23)
CO2: 24 mEq/L (ref 19–32)
Calcium: 9.6 mg/dL (ref 8.4–10.5)
Chloride: 105 mEq/L (ref 96–112)
Creatinine, Ser: 1.15 mg/dL (ref 0.40–1.50)
GFR: 63.18 mL/min (ref >60.00)
Glucose, Bld: 161 mg/dL — ABNORMAL HIGH (ref 70–99)
Potassium: 3.7 mEq/L (ref 3.5–5.1)
Sodium: 139 mEq/L (ref 135–145)
Total Bilirubin: 1.1 mg/dL (ref 0.2–1.2)
Total Protein: 7.4 g/dL (ref 6.0–8.3)

## 2020-01-22 LAB — TSH: TSH: 1.36 u[IU]/mL (ref 0.35–4.50)

## 2020-01-22 NOTE — Addendum Note (Signed)
Addended by: Jacob Moores on: 01/22/2020 08:08 AM   Modules accepted: Orders

## 2020-01-22 NOTE — Telephone Encounter (Signed)
Pt had labs done today, so I dont think needs more

## 2020-01-22 NOTE — Telephone Encounter (Signed)
Sent to Dr. John. 

## 2020-01-26 ENCOUNTER — Ambulatory Visit (INDEPENDENT_AMBULATORY_CARE_PROVIDER_SITE_OTHER): Payer: PPO | Admitting: Internal Medicine

## 2020-01-26 ENCOUNTER — Encounter: Payer: Self-pay | Admitting: Internal Medicine

## 2020-01-26 ENCOUNTER — Other Ambulatory Visit: Payer: Self-pay

## 2020-01-26 VITALS — BP 120/70 | HR 94 | Temp 98.1°F | Ht 73.0 in | Wt 206.0 lb

## 2020-01-26 DIAGNOSIS — E785 Hyperlipidemia, unspecified: Secondary | ICD-10-CM | POA: Diagnosis not present

## 2020-01-26 DIAGNOSIS — F32A Depression, unspecified: Secondary | ICD-10-CM

## 2020-01-26 DIAGNOSIS — F329 Major depressive disorder, single episode, unspecified: Secondary | ICD-10-CM

## 2020-01-26 DIAGNOSIS — K219 Gastro-esophageal reflux disease without esophagitis: Secondary | ICD-10-CM | POA: Diagnosis not present

## 2020-01-26 DIAGNOSIS — E538 Deficiency of other specified B group vitamins: Secondary | ICD-10-CM

## 2020-01-26 DIAGNOSIS — E559 Vitamin D deficiency, unspecified: Secondary | ICD-10-CM | POA: Diagnosis not present

## 2020-01-26 DIAGNOSIS — Z0000 Encounter for general adult medical examination without abnormal findings: Secondary | ICD-10-CM

## 2020-01-26 DIAGNOSIS — E1340 Other specified diabetes mellitus with diabetic neuropathy, unspecified: Secondary | ICD-10-CM | POA: Diagnosis not present

## 2020-01-26 NOTE — Progress Notes (Signed)
Subjective:    Patient ID: Paul Tucker, male    DOB: [birth year], 69 y.o.   MRN: [id number]  HPI  Here to f/u; overall doing ok,  Pt denies chest pain, increasing sob or doe, wheezing, orthopnea, PND, increased LE swelling, palpitations, dizziness or syncope.  Pt denies new neurological symptoms such as new headache, or facial or extremity weakness or numbness.  Pt denies polydipsia, polyuria, or low sugar episode.  Pt states overall good compliance with meds, mostly trying to follow appropriate diet, with wt overall stable,  but little exercise however.  Overall lost about 40 lbs  Peak wt has been at least 250 in past.  Wt Readings from Last 3 Encounters:  01/26/20 206 lb (93.4 kg)  07/21/18 227 lb (103 kg)  01/01/18 232 lb (105.2 kg)  Denies worsening depressive symptoms, suicidal ideation, or panic.  On very low dose thyroid med per psychiatry as adjunctive tx only.  Denies worsening reflux, abd pain, dysphagia, n/v, bowel change or blood. Past Medical History:  Diagnosis Date   Acute prostatitis 04/04/2007   ADD 01/17/2007   DEPRESSION 01/17/2007   Dermatophytosis of groin and perianal area 05/31/2008   DIVERTICULOSIS, COLON 04/06/2007   Dysuria 01/27/2009   FLANK PAIN, LEFT 09/30/2007   Flushing 08/26/2009   GERD 01/20/2007   GLUCOSE INTOLERANCE 04/04/2007   Gross hematuria 01/27/2009   HEMATOCHEZIA 07/26/2008   HYPERLIPIDEMIA 01/20/2007   HYPOGONADISM 06/13/2009   Impaired glucose tolerance 12/10/2010   INSOMNIA 04/04/2007   LIBIDO, DECREASED 0/86/5784   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   LOW BACK PAIN 01/20/2007   OBSTRUCTIVE SLEEP APNEA 04/04/2007   OTITIS MEDIA, ACUTE, BILATERAL 06/13/2009   PANCREATITIS, HX OF 01/17/2007   PSA, INCREASED 11/18/2009   RASH-NONVESICULAR 08/26/2009   SINUSITIS- ACUTE-NOS 04/04/2007   SWEATING 09/30/2007   THRUSH 04/04/2007   TONSILLECTOMY AND ADENOIDECTOMY, HX OF 01/17/2007   URI 07/25/2010   URINARY RETENTION 01/27/2009   VERTIGO  06/13/2009   Past Surgical History:  Procedure Laterality Date   CHOLECYSTECTOMY  2005   HEMORRHOID BANDING  2015   [place]    reports that he has never smoked. He has never used smokeless tobacco. He reports current alcohol use. He reports that he does not use drugs. family history includes Coronary artery disease in an other family member; Diabetes in an other family member; Emphysema in his father; Heart disease in his father. Allergies  Allergen Reactions   Atrovent [Ipratropium] Other (See Comments)    Throat swelling   Pravastatin Sodium     REACTION: nausea  Pt does not recall this reaction   Current Outpatient Medications on File Prior to Visit  Medication Sig Dispense Refill   aspirin 81 MG EC tablet Take 81 mg by mouth daily.       Ciclopirox 0.77 % gel USE AS DIRECTED TOPCIALLY 45 g 1   doxycycline (VIBRA-TABS) 100 MG tablet Take 1 tablet (100 mg total) by mouth 2 (two) times daily. 60 tablet 0   fluocinonide cream (LIDEX) 6.96 % Apply 1 application topically 2 (two) times daily. 30 g 0   lithium carbonate (ESKALITH) 450 MG CR tablet Take 450 mg by mouth at bedtime.     lovastatin (MEVACOR) 40 MG tablet Take 1 tablet (40 mg total) by mouth at bedtime. 90 tablet 3   meclizine (ANTIVERT) 25 MG tablet TAKE ONE TABLET BY MOUTH THREE TIMES A DAY AS  NEEDED FOR DIZZINESS 30 tablet 2   mometasone (ELOCON) 0.1 % cream Apply 1 application topically daily. 45 g 0   OLANZapine (ZYPREXA) 7.5 MG tablet Take 7.5 mg by mouth at bedtime.     Solriamfetol HCl (SUNOSI) 75 MG TABS      SYNTHROID 25 MCG tablet Take 25 mcg by mouth daily.     tamsulosin (FLOMAX) 0.4 MG CAPS capsule Take 1 capsule (0.4 mg total) by mouth daily. 90 capsule 3   TRINTELLIX 20 MG TABS tablet Take 20 mg by mouth daily.     zolpidem (AMBIEN) 10 MG tablet TAKE ONE TABLET BY MOUTH EVERY NIGHT AT BEDTIME 90 tablet 1   No current  facility-administered medications on file prior to visit.   Review of Systems All otherwise neg per pt    Objective:   Physical Exam BP 120/70 (BP Location: Left Arm, Patient Position: Sitting, Cuff Size: Large)    Pulse 94    Temp 98.1 F (36.7 C) (Oral)    Ht 6\' 1"  (1.854 m)    Wt 206 lb (93.4 kg)    SpO2 95%    BMI 27.18 kg/m  VS noted,  Constitutional: Pt appears in NAD HENT: Head: NCAT.  Right Ear: External ear normal.  Left Ear: External ear normal.  Eyes: . Pupils are equal, round, and reactive to light. Conjunctivae and EOM are normal Nose: without d/c or deformity Neck: Neck supple. Gross normal ROM Cardiovascular: Normal rate and regular rhythm.   Pulmonary/Chest: Effort normal and breath sounds without rales or wheezing.  Abd:  Soft, NT, ND, + BS, no organomegaly Neurological: Pt is alert. At baseline orientation, motor grossly intact Skin: Skin is warm. No rashes, other new lesions, no LE edema Psychiatric: Pt behavior is normal without agitation  All otherwise neg per pt Lab Results  Component Value Date   WBC 7.4 07/14/2019   HGB 13.6 07/14/2019   HCT 39.2 07/14/2019   PLT 221.0 07/14/2019   GLUCOSE 161 (H) 01/22/2020   CHOL 117 01/22/2020   TRIG 110.0 01/22/2020   HDL 40.60 01/22/2020   LDLDIRECT 82.0 02/28/2017   LDLCALC 55 01/22/2020   ALT 16 01/22/2020   AST 15 01/22/2020   NA 139 01/22/2020   K 3.7 01/22/2020   CL 105 01/22/2020   CREATININE 1.15 01/22/2020   BUN 16 01/22/2020   CO2 24 01/22/2020   TSH 1.36 01/22/2020   PSA 3.4 01/20/2020   HGBA1C 5.7 01/22/2020   MICROALBUR 1.8 07/14/2019      Assessment & Plan:

## 2020-01-26 NOTE — Patient Instructions (Signed)
Please continue all other medications as before, and refills have been done if requested.  Please have the pharmacy call with any other refills you may need.  Please continue your efforts at being more active, low cholesterol diet, and weight control.  Please keep your appointments with your specialists as you may have planned  Please make an Appointment to return in 6 months, or sooner if needed, also with Lab Appointment for testing done 3-5 days before at the [facility] (so this is for TWO appointments - please see the scheduling desk as you leave)  

## 2020-01-31 ENCOUNTER — Encounter: Payer: Self-pay | Admitting: Internal Medicine

## 2020-01-31 NOTE — Assessment & Plan Note (Signed)
stable overall by history and exam, recent data reviewed with pt, and pt to continue medical treatment as before,  to f/u any worsening symptoms or concerns  

## 2020-01-31 NOTE — Assessment & Plan Note (Addendum)
stable overall by history and exam, recent data reviewed with pt, and pt to continue medical treatment as before,  to f/u any worsening symptoms or concerns  I spent 31 minutes in preparing to see the patient by review of recent labs, imaging and procedures, obtaining and reviewing separately obtained history, communicating with the patient and family or caregiver, ordering medications, tests or procedures, and documenting clinical information in the EHR including the differential Dx, treatment, and any further evaluation and other management of dm, depression, gerd, hld, vit d def

## 2020-01-31 NOTE — Assessment & Plan Note (Signed)
Cont oral replacement 

## 2020-02-12 DIAGNOSIS — D225 Melanocytic nevi of trunk: Secondary | ICD-10-CM | POA: Diagnosis not present

## 2020-02-12 DIAGNOSIS — Q825 Congenital non-neoplastic nevus: Secondary | ICD-10-CM | POA: Diagnosis not present

## 2020-02-12 DIAGNOSIS — L821 Other seborrheic keratosis: Secondary | ICD-10-CM | POA: Diagnosis not present

## 2020-02-12 DIAGNOSIS — D2262 Melanocytic nevi of left upper limb, including shoulder: Secondary | ICD-10-CM | POA: Diagnosis not present

## 2020-03-09 ENCOUNTER — Ambulatory Visit (INDEPENDENT_AMBULATORY_CARE_PROVIDER_SITE_OTHER): Payer: PPO | Admitting: *Deleted

## 2020-03-09 ENCOUNTER — Other Ambulatory Visit: Payer: Self-pay

## 2020-03-09 DIAGNOSIS — Z23 Encounter for immunization: Secondary | ICD-10-CM

## 2020-03-10 DIAGNOSIS — F3342 Major depressive disorder, recurrent, in full remission: Secondary | ICD-10-CM | POA: Diagnosis not present

## 2020-03-11 DIAGNOSIS — R972 Elevated prostate specific antigen [PSA]: Secondary | ICD-10-CM | POA: Diagnosis not present

## 2020-03-14 ENCOUNTER — Ambulatory Visit (INDEPENDENT_AMBULATORY_CARE_PROVIDER_SITE_OTHER): Payer: PPO

## 2020-03-14 ENCOUNTER — Other Ambulatory Visit: Payer: Self-pay

## 2020-03-14 VITALS — BP 110/70 | HR 86 | Temp 98.2°F | Resp 16 | Ht 73.0 in | Wt 206.6 lb

## 2020-03-14 DIAGNOSIS — Z0000 Encounter for general adult medical examination without abnormal findings: Secondary | ICD-10-CM

## 2020-03-14 NOTE — Progress Notes (Signed)
Subjective:   Paul Tucker is a 69 y.o. male who presents for Medicare Annual/Subsequent preventive examination.  Review of Systems    No ROS. Medicare Wellness Visit. Cardiac Risk Factors include: advanced age (>5men, >23 women);dyslipidemia;family history of premature cardiovascular disease;male gender     Objective:    Today's Vitals   03/14/20 1351  BP: 110/70  Pulse: 86  Resp: 16  Temp: 98.2 F (36.8 C)  SpO2: 95%  Weight: 206 lb 9.6 oz (93.7 kg)  Height: 6\' 1"  (1.854 m)  PainSc: 0-No pain   Body mass index is 27.26 kg/m.  Advanced Directives 03/14/2020  Does Patient Have a Medical Advance Directive? Yes  Type of Paramedic of [place];Living will  Does patient want to make changes to medical advance directive? No - Patient declined  Copy of [place] in Chart? No - copy requested    Current Medications (verified) Outpatient Encounter Medications as of 03/14/2020  Medication Sig  . aspirin 81 MG EC tablet Take 81 mg by mouth daily.    . Ciclopirox 0.77 % gel USE AS DIRECTED TOPCIALLY  . doxycycline (VIBRA-TABS) 100 MG tablet Take 1 tablet (100 mg total) by mouth 2 (two) times daily.  . fluocinonide cream (LIDEX) 3.26 % Apply 1 application topically 2 (two) times daily.  Marland Kitchen lithium carbonate (ESKALITH) 450 MG CR tablet Take 450 mg by mouth at bedtime.  . lovastatin (MEVACOR) 40 MG tablet Take 1 tablet (40 mg total) by mouth at bedtime.  . meclizine (ANTIVERT) 25 MG tablet TAKE ONE TABLET BY MOUTH THREE TIMES A DAY AS NEEDED FOR DIZZINESS  . mometasone (ELOCON) 0.1 % cream Apply 1 application topically daily.  Marland Kitchen OLANZapine (ZYPREXA) 7.5 MG tablet Take 7.5 mg by mouth at bedtime.  . Solriamfetol HCl (SUNOSI) 75 MG TABS   . SYNTHROID 25 MCG tablet Take 25 mcg by mouth daily.  . tamsulosin (FLOMAX) 0.4 MG CAPS capsule Take 1 capsule (0.4 mg total) by mouth daily.  . TRINTELLIX 20 MG TABS tablet Take 20 mg by mouth daily.   Marland Kitchen zolpidem (AMBIEN) 10 MG tablet TAKE ONE TABLET BY MOUTH EVERY NIGHT AT BEDTIME   No facility-administered encounter medications on file as of 03/14/2020.    Allergies (verified) Atrovent [ipratropium] and Pravastatin sodium   History: Past Medical History:  Diagnosis Date  . Acute prostatitis 04/04/2007  . ADD 01/17/2007  . DEPRESSION 01/17/2007  . Dermatophytosis of groin and perianal area 05/31/2008  . DIVERTICULOSIS, COLON 04/06/2007  . Dysuria 01/27/2009  . FLANK PAIN, LEFT 09/30/2007  . Flushing 08/26/2009  . GLUCOSE INTOLERANCE 04/04/2007  . Gross hematuria 01/27/2009  . HEMATOCHEZIA 07/26/2008  . HYPERLIPIDEMIA 01/20/2007  . HYPOGONADISM 06/13/2009  . Impaired glucose tolerance 12/10/2010  . INSOMNIA 04/04/2007  . LIBIDO, DECREASED 12/02/2008  . 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 LOW BACK PAIN 01/20/2007  . OBSTRUCTIVE SLEEP APNEA 04/04/2007  . OTITIS MEDIA, ACUTE, BILATERAL 06/13/2009  . PANCREATITIS, HX OF 01/17/2007  . PSA, INCREASED 11/18/2009  . RASH-NONVESICULAR 08/26/2009  . SINUSITIS- ACUTE-NOS 04/04/2007  . SWEATING 09/30/2007  . THRUSH 04/04/2007  . TONSILLECTOMY AND ADENOIDECTOMY, HX OF 01/17/2007  . URI 07/25/2010  . URINARY RETENTION 01/27/2009  . VERTIGO 06/13/2009   Past Surgical History:  Procedure Laterality Date  . CHOLECYSTECTOMY  2005  . HEMORRHOID BANDING  2015  . HERNIA REPAIR  1957  . TONSILLECTOMY    . [place] History  Problem Relation Age of Onset  . Emphysema Father   . Heart disease Father   . Coronary artery disease Other        male, 1st degree relative  . Diabetes Other        1st degree relative  . Colon cancer Neg Hx   . Esophageal cancer Neg Hx   . Rectal cancer Neg Hx   . Stomach cancer Neg Hx    Social History   Socioeconomic History  . Marital status: Married    Spouse name: Not on file  . Number of children: 3  . Years of education: Not on file  . Highest education level: Not on file  Occupational History  .  Occupation: Brewing technologist  Tobacco Use  . Smoking status: Never Smoker  . Smokeless tobacco: Never Used  Substance and Sexual Activity  . Alcohol use: Never    Alcohol/week: 0.0 standard drinks  . Drug use: No  . Sexual activity: Not on file  Other Topics Concern  . Not on file  Social History Narrative  . Not on file   Social Determinants of Health   Financial Resource Strain: Low Risk   . Difficulty of Paying Living Expenses: Not hard at all  Food Insecurity: No Food Insecurity  . Worried About Charity fundraiser in the Last Year: Never true  . Ran Out of Food in the Last Year: Never true  Transportation Needs: No Transportation Needs  . Lack of Transportation (Medical): No  . Lack of Transportation (Non-Medical): No  Physical Activity: Inactive  . Days of Exercise per Week: 0 days  . Minutes of Exercise per Session: 0 min  Stress: No Stress Concern Present  . Feeling of Stress : Not at all  Social Connections:   . Frequency of Communication with Friends and Family: Not on file  . Frequency of Social Gatherings with Friends and Family: Not on file  . Attends Religious Services: Not on file  . Active Member of Clubs or Organizations: Not on file  . Attends Archivist Meetings: Not on file  . Marital Status: Not on file    Tobacco Counseling Counseling given: Not Answered   Clinical Intake:  Pre-visit preparation completed: Yes  Pain : No/denies pain Pain Score: 0-No pain     BMI - recorded: 27.26 Nutritional Status: BMI 25 -29 Overweight Nutritional Risks: None Diabetes: No  How often do you need to have someone help you when you read instructions, pamphlets, or other written materials from your doctor or pharmacy?: 1 - Never What is the last grade level you completed in school?: Some College  Diabetic? no  Interpreter Needed?: No  Information entered by :: [name], LPN   Activities of Daily Living In your present state  of health, do you have any difficulty performing the following activities: 03/14/2020  Hearing? N  Vision? N  Difficulty concentrating or making decisions? N  Walking or climbing stairs? N  Dressing or bathing? N  Doing errands, shopping? N  Preparing Food and eating ? N  Using the Toilet? N  In the past six months, have you accidently leaked urine? N  Do you have problems with loss of bowel control? N  Managing your Medications? N  Managing your Finances? N  Housekeeping or managing your Housekeeping? N  Some recent data might be hidden    Patient Care Team: Biagio Borg, MD as PCP - General  Indicate any recent Medical  Services you may have received from other than [facility] providers in the past year (date may be approximate).     Assessment:   This is a routine wellness examination for Manuelito.  Hearing/Vision screen No exam data present  Dietary issues and exercise activities discussed: Current Exercise Habits: The patient does not participate in regular exercise at present, Exercise limited by: psychological condition(s)  Goals   None    Depression Screen PHQ 2/9 Scores 03/14/2020 07/23/2019 07/21/2018 07/03/2017 03/06/2017 03/13/2016 03/10/2015  PHQ - 2 Score 2 1 1 2 2  0 0  PHQ- 9 Score - - - - 2 - -    Fall Risk Fall Risk  01/26/2020 07/23/2019 07/21/2018 07/03/2017 06/04/2017  Falls in the past year? 0 0 0 No No  Number falls in past yr: 0 - - - -  Injury with Fall? 0 - - - -  Risk for fall due to : No Fall Risks - - - -  Follow up Falls evaluation completed - - - -    Any stairs in or around the home? Yes  If so, are there any without handrails? No  Home free of loose throw rugs in walkways, pet beds, electrical cords, etc? Yes  Adequate lighting in your home to reduce risk of falls? Yes   ASSISTIVE DEVICES UTILIZED TO PREVENT FALLS:  Life alert? No  Use of a cane, walker or w/c? No  Grab bars in the bathroom? Yes  Shower chair or bench in shower? Yes  Elevated  toilet seat or a handicapped toilet? Yes   TIMED UP AND GO:  Was the test performed? No .  Length of time to ambulate 10 feet: 0 sec.   Gait steady and fast without use of assistive device  Cognitive Function: Patient is cogitatively intact.        Immunizations Immunization History  Administered Date(s) Administered  . Fluad Quad(high Dose 65+) 02/28/2019, 03/09/2020  . Influenza Whole 02/28/2006, 04/04/2007  . Influenza, High Dose Seasonal PF 03/06/2017, 02/07/2018  . Influenza,inj,Quad PF,6+ Mos 02/25/2014, 03/10/2015, 03/13/2016  . Influenza-Unspecified 02/19/2012  . PFIZER SARS-COV-2 Vaccination 06/25/2019, 07/20/2019  . Pneumococcal Conjugate-13 03/06/2017  . Pneumococcal Polysaccharide-23 02/25/2014  . Td 12/02/2008  . Zoster 12/04/2011  . Zoster Recombinat (Shingrix) 07/24/2018, 11/26/2018    TDAP status: Due, Education has been provided regarding the importance of this vaccine. Advised may receive this vaccine at local pharmacy or Health Dept. Aware to provide a copy of the vaccination record if obtained from local pharmacy or Health Dept. Verbalized acceptance and understanding. Flu Vaccine status: Up to date Pneumococcal vaccine status: Up to date Covid-19 vaccine status: Completed vaccines  Qualifies for Shingles Vaccine? Yes   Zostavax completed Yes   Shingrix Completed?: Yes  Screening Tests Health Maintenance  Topic Date Due  . TETANUS/TDAP  07/22/2020 (Originally 12/03/2018)  . PNA vac Low Risk Adult (2 of 2 - PPSV23) 07/22/2020 (Originally 02/26/2019)  . URINE MICROALBUMIN  07/13/2020  . HEMOGLOBIN A1C  07/21/2020  . OPHTHALMOLOGY EXAM  10/11/2020  . FOOT EXAM  01/25/2021  . COLONOSCOPY  07/05/2024  . INFLUENZA VACCINE  Completed  . COVID-19 Vaccine  Completed  . Hepatitis C Screening  Completed    Health Maintenance  There are no preventive care reminders to display for this patient.  Colorectal cancer screening: Completed 07/05/2014. Repeat  every 10 years  Lung Cancer Screening: (Low Dose CT Chest recommended if Age 29-80 years, 30 pack-year currently smoking OR have quit w/in  15years.) does not qualify.   Lung Cancer Screening Referral: no  Additional Screening:  Hepatitis C Screening: does qualify; Completed yes  Vision Screening: Recommended annual ophthalmology exams for early detection of glaucoma and other disorders of the eye. Is the patient up to date with their annual eye exam?  Yes  Who is the provider or what is the name of the office in which the patient attends annual eye exams? Bufford Buttner, DO If pt is not established with a provider, would they like to be referred to a provider to establish care? No .   Dental Screening: Recommended annual dental exams for proper oral hygiene  Community Resource Referral / Chronic Care Management: CRR required this visit?  No   CCM required this visit?  No      Plan:     I have personally reviewed and noted the following in the patient's chart:   . Medical and social history . Use of alcohol, tobacco or illicit drugs  . Current medications and supplements . Functional ability and status . Nutritional status . Physical activity . Advanced directives . List of other physicians . Hospitalizations, surgeries, and ER visits in previous 12 months . Vitals . Screenings to include cognitive, depression, and falls . Referrals and appointments  In addition, I have reviewed and discussed with patient certain preventive protocols, quality metrics, and best practice recommendations. A written personalized care plan for preventive services as well as general preventive health recommendations were provided to patient.     Sheral Flow, LPN   [id number]   Nurse Notes: n/a

## 2020-03-14 NOTE — Patient Instructions (Signed)
[name] , Thank you for taking time to come for your Medicare Wellness Visit. I appreciate your ongoing commitment to your health goals. Please review the following plan we discussed and let me know if I can assist you in the future.   Screening recommendations/referrals: Colonoscopy: 07/05/2014; due every 10 years Recommended yearly ophthalmology/optometry visit for glaucoma screening and checkup Recommended yearly dental visit for hygiene and checkup  Vaccinations: Influenza vaccine: 02/28/2020 Pneumococcal vaccine: up to date Tdap vaccine: 12/02/2008; overdue Shingles vaccine: up to date   Covid-19: up to date  Advanced directives: Please bring a copy of your health care power of attorney and living will to the office at your convenience.  Conditions/risks identified: Yes; Reviewed health maintenance screenings with patient today and relevant education, vaccines, and/or referrals were provided. Please continue to do your personal lifestyle choices by: daily care of teeth and gums, regular physical activity (goal should be 5 days a week for 30 minutes), eat a healthy diet, avoid tobacco and drug use, limiting any alcohol intake, taking a low-dose aspirin (if not allergic or have been advised by your provider otherwise) and taking vitamins and minerals as recommended by your provider. Continue doing brain stimulating activities (puzzles, reading, adult coloring books, staying active) to keep memory sharp. Continue to eat heart healthy diet (full of fruits, vegetables, whole grains, lean protein, water--limit salt, fat, and sugar intake) and increase physical activity as tolerated.  Next appointment: Please schedule your next Medicare Wellness Visit with your Nurse Health Advisor in 1 year by calling [phone].   Preventive Care 70 Years and Older, Male Preventive care refers to lifestyle choices and visits with your health care provider that can promote health and wellness. What does  preventive care include?  A yearly physical exam. This is also called an annual well check.  Dental exams once or twice a year.  Routine eye exams. Ask your health care provider how often you should have your eyes checked.  Personal lifestyle choices, including:  Daily care of your teeth and gums.  Regular physical activity.  Eating a healthy diet.  Avoiding tobacco and drug use.  Limiting alcohol use.  Practicing safe sex.  Taking low doses of aspirin every day.  Taking vitamin and mineral supplements as recommended by your health care provider. What happens during an annual well check? The services and screenings done by your health care provider during your annual well check will depend on your age, overall health, lifestyle risk factors, and family history of disease. Counseling  Your health care provider may ask you questions about your:  Alcohol use.  Tobacco use.  Drug use.  Emotional well-being.  Home and relationship well-being.  Sexual activity.  Eating habits.  History of falls.  Memory and ability to understand (cognition).  Work and work Statistician. Screening  You may have the following tests or measurements:  Height, weight, and BMI.  Blood pressure.  Lipid and cholesterol levels. These may be checked every 5 years, or more frequently if you are over 68 years old.  Skin check.  Lung cancer screening. You may have this screening every year starting at age 64 if you have a 30-pack-year history of smoking and currently smoke or have quit within the past 15 years.  Fecal occult blood test (FOBT) of the stool. You may have this test every year starting at age 47.  Flexible sigmoidoscopy or colonoscopy. You may have a sigmoidoscopy every 5 years or a colonoscopy every 10 years starting  at age 58.  Prostate cancer screening. Recommendations will vary depending on your family history and other risks.  Hepatitis C blood test.  Hepatitis B  blood test.  Sexually transmitted disease (STD) testing.  Diabetes screening. This is done by checking your blood sugar (glucose) after you have not eaten for a while (fasting). You may have this done every 1-3 years.  Abdominal aortic aneurysm (AAA) screening. You may need this if you are a current or former smoker.  Osteoporosis. You may be screened starting at age 82 if you are at high risk. Talk with your health care provider about your test results, treatment options, and if necessary, the need for more tests. Vaccines  Your health care provider may recommend certain vaccines, such as:  Influenza vaccine. This is recommended every year.  Tetanus, diphtheria, and acellular pertussis (Tdap, Td) vaccine. You may need a Td booster every 10 years.  Zoster vaccine. You may need this after age 29.  Pneumococcal 13-valent conjugate (PCV13) vaccine. One dose is recommended after age 3.  Pneumococcal polysaccharide (PPSV23) vaccine. One dose is recommended after age 8. Talk to your health care provider about which screenings and vaccines you need and how often you need them. This information is not intended to replace advice given to you by your health care provider. Make sure you discuss any questions you have with your health care provider. Document Released: 06/03/2015 Document Revised: 01/25/2016 Document Reviewed: 03/08/2015 Elsevier Interactive Patient Education  2017 [place] Prevention in the Home Falls can cause injuries. They can happen to people of all ages. There are many things you can do to make your home safe and to help prevent falls. What can I do on the outside of my home?  Regularly fix the edges of walkways and driveways and fix any cracks.  Remove anything that might make you trip as you walk through a door, such as a raised step or threshold.  Trim any bushes or trees on the path to your home.  Use bright outdoor lighting.  Clear any walking paths  of anything that might make someone trip, such as rocks or tools.  Regularly check to see if handrails are loose or broken. Make sure that both sides of any steps have handrails.  Any raised decks and porches should have guardrails on the edges.  Have any leaves, snow, or ice cleared regularly.  Use sand or salt on walking paths during winter.  Clean up any spills in your garage right away. This includes oil or grease spills. What can I do in the bathroom?  Use night lights.  Install grab bars by the toilet and in the tub and shower. Do not use towel bars as grab bars.  Use non-skid mats or decals in the tub or shower.  If you need to sit down in the shower, use a plastic, non-slip stool.  Keep the floor dry. Clean up any water that spills on the floor as soon as it happens.  Remove soap buildup in the tub or shower regularly.  Attach bath mats securely with double-sided non-slip rug tape.  Do not have throw rugs and other things on the floor that can make you trip. What can I do in the bedroom?  Use night lights.  Make sure that you have a light by your bed that is easy to reach.  Do not use any sheets or blankets that are too big for your bed. They should not hang down onto  the floor.  Have a firm chair that has side arms. You can use this for support while you get dressed.  Do not have throw rugs and other things on the floor that can make you trip. What can I do in the kitchen?  Clean up any spills right away.  Avoid walking on wet floors.  Keep items that you use a lot in easy-to-reach places.  If you need to reach something above you, use a strong step stool that has a grab bar.  Keep electrical cords out of the way.  Do not use floor polish or wax that makes floors slippery. If you must use wax, use non-skid floor wax.  Do not have throw rugs and other things on the floor that can make you trip. What can I do with my stairs?  Do not leave any items on  the stairs.  Make sure that there are handrails on both sides of the stairs and use them. Fix handrails that are broken or loose. Make sure that handrails are as [place] as the stairways.  Check any carpeting to make sure that it is firmly attached to the stairs. Fix any carpet that is loose or worn.  Avoid having throw rugs at the top or bottom of the stairs. If you do have throw rugs, attach them to the floor with carpet tape.  Make sure that you have a light switch at the top of the stairs and the bottom of the stairs. If you do not have them, ask someone to add them for you. What else can I do to help prevent falls?  Wear shoes that:  Do not have high heels.  Have rubber bottoms.  Are comfortable and fit you well.  Are closed at the toe. Do not wear sandals.  If you use a stepladder:  Make sure that it is fully opened. Do not climb a closed stepladder.  Make sure that both sides of the stepladder are locked into place.  Ask someone to hold it for you, if possible.  Clearly mark and make sure that you can see:  Any grab bars or handrails.  First and last steps.  Where the edge of each step is.  Use tools that help you move around (mobility aids) if they are needed. These include:  Canes.  Walkers.  Scooters.  Crutches.  Turn on the lights when you go into a dark area. Replace any light bulbs as soon as they burn out.  Set up your furniture so you have a clear path. Avoid moving your furniture around.  If any of your floors are uneven, fix them.  If there are any pets around you, be aware of where they are.  Review your medicines with your doctor. Some medicines can make you feel dizzy. This can increase your chance of falling. Ask your doctor what other things that you can do to help prevent falls. This information is not intended to replace advice given to you by your health care provider. Make sure you discuss any questions you have with your health care  provider. Document Released: 03/03/2009 Document Revised: 10/13/2015 Document Reviewed: 06/11/2014 Elsevier Interactive Patient Education  2017 [employer].

## 2020-03-17 DIAGNOSIS — R3912 Poor urinary stream: Secondary | ICD-10-CM | POA: Diagnosis not present

## 2020-03-17 DIAGNOSIS — N401 Enlarged prostate with lower urinary tract symptoms: Secondary | ICD-10-CM | POA: Diagnosis not present

## 2020-03-17 DIAGNOSIS — R972 Elevated prostate specific antigen [PSA]: Secondary | ICD-10-CM | POA: Diagnosis not present

## 2020-05-08 ENCOUNTER — Other Ambulatory Visit: Payer: Self-pay | Admitting: Internal Medicine

## 2020-05-24 DIAGNOSIS — F3342 Major depressive disorder, recurrent, in full remission: Secondary | ICD-10-CM | POA: Diagnosis not present

## 2020-06-09 ENCOUNTER — Encounter: Payer: Self-pay | Admitting: Internal Medicine

## 2020-06-09 ENCOUNTER — Ambulatory Visit (INDEPENDENT_AMBULATORY_CARE_PROVIDER_SITE_OTHER): Payer: PPO | Admitting: Internal Medicine

## 2020-06-09 ENCOUNTER — Other Ambulatory Visit: Payer: Self-pay

## 2020-06-09 VITALS — BP 138/72 | HR 88 | Temp 98.5°F | Ht 73.0 in | Wt 206.0 lb

## 2020-06-09 DIAGNOSIS — K4090 Unilateral inguinal hernia, without obstruction or gangrene, not specified as recurrent: Secondary | ICD-10-CM | POA: Diagnosis not present

## 2020-06-09 DIAGNOSIS — E1340 Other specified diabetes mellitus with diabetic neuropathy, unspecified: Secondary | ICD-10-CM

## 2020-06-09 DIAGNOSIS — F3289 Other specified depressive episodes: Secondary | ICD-10-CM

## 2020-06-09 NOTE — Progress Notes (Signed)
Established Patient Office Visit  Subjective:  Patient ID: Paul Tucker, male    DOB: February 26, 1951  Age: 70 y.o. MRN: [id number]       Chief Complaint:  Right groin swelling, DM and depression       HPI:  Paul Tucker is a 70 y.o. male here to c/o 2 wk onset new onset swelling to the right groin without significant discomfort; seems to come and go, worse to stand, better to lie down,and Denies worsening reflux, abd pain, dysphagia, n/v, bowel change or blood.   Pt denies polydipsia, polyuria, Also, Denies worsening depressive symptoms, suicidal ideation, or panic;.        Wt Readings from Last 3 Encounters:  06/09/20 206 lb (93.4 kg)  03/14/20 206 lb 9.6 oz (93.7 kg)  01/26/20 206 lb (93.4 kg)   BP Readings from Last 3 Encounters:  06/09/20 138/72  03/14/20 110/70  01/26/20 120/70    Past Medical History:  Diagnosis Date  . Acute prostatitis 04/04/2007  . ADD 01/17/2007  . DEPRESSION 01/17/2007  . Dermatophytosis of groin and perianal area 05/31/2008  . DIVERTICULOSIS, COLON 04/06/2007  . Dysuria 01/27/2009  . FLANK PAIN, LEFT 09/30/2007  . Flushing 08/26/2009  . GLUCOSE INTOLERANCE 04/04/2007  . Gross hematuria 01/27/2009  . HEMATOCHEZIA 07/26/2008  . HYPERLIPIDEMIA 01/20/2007  . HYPOGONADISM 06/13/2009  . Impaired glucose tolerance 12/10/2010  . INSOMNIA 04/04/2007  . LIBIDO, DECREASED 12/02/2008  . 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 LOW BACK PAIN 01/20/2007  . OBSTRUCTIVE SLEEP APNEA 04/04/2007  . OTITIS MEDIA, ACUTE, BILATERAL 06/13/2009  . PANCREATITIS, HX OF 01/17/2007  . PSA, INCREASED 11/18/2009  . RASH-NONVESICULAR 08/26/2009  . SINUSITIS- ACUTE-NOS 04/04/2007  . SWEATING 09/30/2007  . THRUSH 04/04/2007  . TONSILLECTOMY AND ADENOIDECTOMY, HX OF 01/17/2007  . URI 07/25/2010  . URINARY RETENTION 01/27/2009  . VERTIGO 06/13/2009   Past Surgical History:  Procedure Laterality Date  . CHOLECYSTECTOMY  2005  . HEMORRHOID BANDING  2015  . HERNIA REPAIR  1957  . TONSILLECTOMY    .  VASECTOMY  1987    reports that he has never smoked. He has never used smokeless tobacco. He reports that he does not drink alcohol and does not use drugs. family history includes Coronary artery disease in an other family member; Diabetes in an other family member; Emphysema in his father; Heart disease in his father. Allergies  Allergen Reactions  . Atrovent [Ipratropium] Other (See Comments)    Throat swelling  . Pravastatin Sodium     REACTION: nausea  Pt does not recall this reaction   Current Outpatient Medications on File Prior to Visit  Medication Sig Dispense Refill  . aspirin 81 MG EC tablet Take 81 mg by mouth daily.    . Ciclopirox 0.77 % gel USE AS DIRECTED TOPCIALLY 45 g 1  . fluocinonide cream (LIDEX) [id number] % Apply 1 application topically 2 (two) times daily. 30 g 0  . lovastatin (MEVACOR) 40 MG tablet Take 1 tablet (40 mg total) by mouth at bedtime. 90 tablet 3  . meclizine (ANTIVERT) 25 MG tablet TAKE ONE TABLET BY MOUTH THREE TIMES A DAY AS NEEDED FOR DIZZINESS 30 tablet 2  . mometasone (ELOCON) 0.1 % cream Apply 1 application topically daily. 45 g 0  . OLANZapine (ZYPREXA) 7.5 MG tablet Take 7.5 mg by mouth at bedtime.    Marland Kitchen SYNTHROID 25 MCG tablet Take 25 mcg by mouth daily.    . tamsulosin (FLOMAX) 0.4 MG  CAPS capsule Take 1 capsule (0.4 mg total) by mouth daily. 90 capsule 3  . TRINTELLIX 20 MG TABS tablet Take 20 mg by mouth daily.    Marland Kitchen zolpidem (AMBIEN) 10 MG tablet TAKE ONE TABLET BY MOUTH EVERY NIGHT AT BEDTIME 90 tablet 1  . doxycycline (VIBRA-TABS) 100 MG tablet Take 1 tablet (100 mg total) by mouth 2 (two) times daily. 60 tablet 0  . lithium carbonate (ESKALITH) 450 MG CR tablet Take 450 mg by mouth at bedtime.    . Solriamfetol HCl (SUNOSI) 75 MG TABS      No current facility-administered medications on file prior to visit.        ROS:  All others reviewed and negative.  Objective        PE:  BP 138/72   Pulse 88   Temp 98.5 F (36.9 C) (Oral)   Ht  6\' 1"  (1.854 m)   Wt 206 lb (93.4 kg)   SpO2 96%   BMI 27.18 kg/m                 Constitutional: Pt appears in NAD               HENT: Head: NCAT.                Right Ear: External ear normal.                 Left Ear: External ear normal.                Eyes: . Pupils are equal, round, and reactive to light. Conjunctivae and EOM are normal               Nose: without d/c or deformity               Neck: Neck supple. Gross normal ROM               Cardiovascular: Normal rate and regular rhythm.                 Pulmonary/Chest: Effort normal and breath sounds without rales or wheezing.                Abd:  Soft, NT, ND, + BS, no organomegaly, right groin with moderate inguinal swelling, minor tender, and reducible               Neurological: Pt is alert. At baseline orientation, motor grossly intact               Skin: Skin is warm. No rashes, no other new lesions, LE edema - none               Psychiatric: Pt behavior is normal without agitation   Assessment/Plan:  Paul Tucker is a 70 y.o. White or Caucasian [1] male with  has a past medical history of Acute prostatitis (04/04/2007), ADD (01/17/2007), DEPRESSION (01/17/2007), Dermatophytosis of groin and perianal area (05/31/2008), DIVERTICULOSIS, COLON (04/06/2007), Dysuria (01/27/2009), FLANK PAIN, LEFT (09/30/2007), Flushing (08/26/2009), GLUCOSE INTOLERANCE (04/04/2007), Gross hematuria (01/27/2009), HEMATOCHEZIA (07/26/2008), HYPERLIPIDEMIA (01/20/2007), HYPOGONADISM (06/13/2009), Impaired glucose tolerance (12/10/2010), INSOMNIA (04/04/2007), LIBIDO, DECREASED ([id number]), LICHEN SIMPLEX CHRONICUS (05/31/2008), LOW BACK PAIN (01/20/2007), OBSTRUCTIVE SLEEP APNEA (04/04/2007), OTITIS MEDIA, ACUTE, BILATERAL (06/13/2009), PANCREATITIS, HX OF (01/17/2007), PSA, INCREASED (11/18/2009), RASH-NONVESICULAR (08/26/2009), SINUSITIS- ACUTE-NOS (04/04/2007), SWEATING (09/30/2007), THRUSH (04/04/2007), TONSILLECTOMY AND ADENOIDECTOMY, HX OF (01/17/2007), URI (07/25/2010),  URINARY RETENTION (01/27/2009), and VERTIGO (06/13/2009).   Assessment Plan  See problem oriented  assessment and plan Labs/data reviewed for each problem:  Micro: none  Cardiac tracings I have personally interpreted today:  none  Pertinent Radiological findings (summarize): none     Health Maintenance Due  Topic Date Due  . URINE MICROALBUMIN  07/13/2020    There are no preventive care reminders to display for this patient.  Lab Results  Component Value Date   TSH 1.36 01/22/2020   Lab Results  Component Value Date   WBC 7.4 07/14/2019   HGB 13.6 07/14/2019   HCT 39.2 07/14/2019   MCV 96.5 07/14/2019   PLT 221.0 07/14/2019   Lab Results  Component Value Date   NA 139 01/22/2020   K 3.7 01/22/2020   CO2 24 01/22/2020   GLUCOSE 161 (H) 01/22/2020   BUN 16 01/22/2020   CREATININE 1.15 01/22/2020   BILITOT 1.1 01/22/2020   ALKPHOS 61 01/22/2020   AST 15 01/22/2020   ALT 16 01/22/2020   PROT 7.4 01/22/2020   ALBUMIN 4.6 01/22/2020   CALCIUM 9.6 01/22/2020   GFR 63.18 01/22/2020   Lab Results  Component Value Date   CHOL 117 01/22/2020   Lab Results  Component Value Date   HDL 40.60 01/22/2020   Lab Results  Component Value Date   LDLCALC 55 01/22/2020   Lab Results  Component Value Date   TRIG 110.0 01/22/2020   Lab Results  Component Value Date   CHOLHDL 3 01/22/2020   Lab Results  Component Value Date   HGBA1C 5.7 01/22/2020      Assessment & Plan:   Problem List Items Addressed This Visit      High   Right inguinal hernia    New onest, reducible, mild symptoms, pt reqeusts general surgury referral      Relevant Orders   Ambulatory referral to General Surgery     Medium   Diabetes with neurologic complications [facility]) - Primary    Lab Results  Component Value Date   HGBA1C 5.7 01/22/2020   Stable, pt to continue current medical treatment  - diet  Current Outpatient Medications (Endocrine & Metabolic):  .  SYNTHROID 25 MCG  tablet, Take 25 mcg by mouth daily.  Current Outpatient Medications (Cardiovascular):  .  lovastatin (MEVACOR) 40 MG tablet, Take 1 tablet (40 mg total) by mouth at bedtime.   Current Outpatient Medications (Analgesics):  .  aspirin 81 MG EC tablet, Take 81 mg by mouth daily.   Current Outpatient Medications (Other):  Marland Kitchen  Ciclopirox 0.77 % gel, USE AS DIRECTED TOPCIALLY .  fluocinonide cream (LIDEX) 4.76 %, Apply 1 application topically 2 (two) times daily. .  meclizine (ANTIVERT) 25 MG tablet, TAKE ONE TABLET BY MOUTH THREE TIMES A DAY AS NEEDED FOR DIZZINESS .  mometasone (ELOCON) 0.1 % cream, Apply 1 application topically daily. Marland Kitchen  OLANZapine (ZYPREXA) 7.5 MG tablet, Take 7.5 mg by mouth at bedtime. .  tamsulosin (FLOMAX) 0.4 MG CAPS capsule, Take 1 capsule (0.4 mg total) by mouth daily. .  TRINTELLIX 20 MG TABS tablet, Take 20 mg by mouth daily. Marland Kitchen  zolpidem (AMBIEN) 10 MG tablet, TAKE ONE TABLET BY MOUTH EVERY NIGHT AT BEDTIME .  doxycycline (VIBRA-TABS) 100 MG tablet, Take 1 tablet (100 mg total) by mouth 2 (two) times daily. Marland Kitchen  lithium carbonate (ESKALITH) 450 MG CR tablet, Take 450 mg by mouth at bedtime. .  Solriamfetol HCl (SUNOSI) 75 MG TABS,       Depression    Stable, to cont current med  tx zyprexa, trintilleix, f/u psychiatry as planned   Current Outpatient Medications (Endocrine & Metabolic):  .  SYNTHROID 25 MCG tablet, Take 25 mcg by mouth daily.  Current Outpatient Medications (Cardiovascular):  .  lovastatin (MEVACOR) 40 MG tablet, Take 1 tablet (40 mg total) by mouth at bedtime.   Current Outpatient Medications (Analgesics):  .  aspirin 81 MG EC tablet, Take 81 mg by mouth daily.   Current Outpatient Medications (Other):  Marland Kitchen  Ciclopirox 0.77 % gel, USE AS DIRECTED TOPCIALLY .  fluocinonide cream (LIDEX) 7.03 %, Apply 1 application topically 2 (two) times daily. .  meclizine (ANTIVERT) 25 MG tablet, TAKE ONE TABLET BY MOUTH THREE TIMES A DAY AS NEEDED FOR  DIZZINESS .  mometasone (ELOCON) 0.1 % cream, Apply 1 application topically daily. Marland Kitchen  OLANZapine (ZYPREXA) 7.5 MG tablet, Take 7.5 mg by mouth at bedtime. .  tamsulosin (FLOMAX) 0.4 MG CAPS capsule, Take 1 capsule (0.4 mg total) by mouth daily. .  TRINTELLIX 20 MG TABS tablet, Take 20 mg by mouth daily. Marland Kitchen  zolpidem (AMBIEN) 10 MG tablet, TAKE ONE TABLET BY MOUTH EVERY NIGHT AT BEDTIME .  doxycycline (VIBRA-TABS) 100 MG tablet, Take 1 tablet (100 mg total) by mouth 2 (two) times daily. Marland Kitchen  lithium carbonate (ESKALITH) 450 MG CR tablet, Take 450 mg by mouth at bedtime. .  Solriamfetol HCl (SUNOSI) 75 MG TABS,           No orders of the defined types were placed in this encounter.   Follow-up: Return if symptoms worsen or fail to improve.   Cathlean Cower, MD 06/16/2020 8:20 PM [facility]

## 2020-06-16 ENCOUNTER — Encounter: Payer: Self-pay | Admitting: Internal Medicine

## 2020-06-16 NOTE — Assessment & Plan Note (Signed)
New onest, reducible, mild symptoms, pt reqeusts general surgury referral

## 2020-06-16 NOTE — Assessment & Plan Note (Signed)
Stable, to cont current med tx zyprexa, trintilleix, f/u psychiatry as planned   Current Outpatient Medications (Endocrine & Metabolic):  .  SYNTHROID 25 MCG tablet, Take 25 mcg by mouth daily.  Current Outpatient Medications (Cardiovascular):  .  lovastatin (MEVACOR) 40 MG tablet, Take 1 tablet (40 mg total) by mouth at bedtime.   Current Outpatient Medications (Analgesics):  .  aspirin 81 MG EC tablet, Take 81 mg by mouth daily.   Current Outpatient Medications (Other):  Marland Kitchen  Ciclopirox 0.77 % gel, USE AS DIRECTED TOPCIALLY .  fluocinonide cream (LIDEX) 1.61 %, Apply 1 application topically 2 (two) times daily. .  meclizine (ANTIVERT) 25 MG tablet, TAKE ONE TABLET BY MOUTH THREE TIMES A DAY AS NEEDED FOR DIZZINESS .  mometasone (ELOCON) 0.1 % cream, Apply 1 application topically daily. Marland Kitchen  OLANZapine (ZYPREXA) 7.5 MG tablet, Take 7.5 mg by mouth at bedtime. .  tamsulosin (FLOMAX) 0.4 MG CAPS capsule, Take 1 capsule (0.4 mg total) by mouth daily. .  TRINTELLIX 20 MG TABS tablet, Take 20 mg by mouth daily. Marland Kitchen  zolpidem (AMBIEN) 10 MG tablet, TAKE ONE TABLET BY MOUTH EVERY NIGHT AT BEDTIME .  doxycycline (VIBRA-TABS) 100 MG tablet, Take 1 tablet (100 mg total) by mouth 2 (two) times daily. Marland Kitchen  lithium carbonate (ESKALITH) 450 MG CR tablet, Take 450 mg by mouth at bedtime. .  Solriamfetol HCl (SUNOSI) 75 MG TABS,

## 2020-06-16 NOTE — Assessment & Plan Note (Signed)
Lab Results  Component Value Date   HGBA1C 5.7 01/22/2020   Stable, pt to continue current medical treatment  - diet  Current Outpatient Medications (Endocrine & Metabolic):  .  SYNTHROID 25 MCG tablet, Take 25 mcg by mouth daily.  Current Outpatient Medications (Cardiovascular):  .  lovastatin (MEVACOR) 40 MG tablet, Take 1 tablet (40 mg total) by mouth at bedtime.   Current Outpatient Medications (Analgesics):  .  aspirin 81 MG EC tablet, Take 81 mg by mouth daily.   Current Outpatient Medications (Other):  Marland Kitchen  Ciclopirox 0.77 % gel, USE AS DIRECTED TOPCIALLY .  fluocinonide cream (LIDEX) 4.19 %, Apply 1 application topically 2 (two) times daily. .  meclizine (ANTIVERT) 25 MG tablet, TAKE ONE TABLET BY MOUTH THREE TIMES A DAY AS NEEDED FOR DIZZINESS .  mometasone (ELOCON) 0.1 % cream, Apply 1 application topically daily. Marland Kitchen  OLANZapine (ZYPREXA) 7.5 MG tablet, Take 7.5 mg by mouth at bedtime. .  tamsulosin (FLOMAX) 0.4 MG CAPS capsule, Take 1 capsule (0.4 mg total) by mouth daily. .  TRINTELLIX 20 MG TABS tablet, Take 20 mg by mouth daily. Marland Kitchen  zolpidem (AMBIEN) 10 MG tablet, TAKE ONE TABLET BY MOUTH EVERY NIGHT AT BEDTIME .  doxycycline (VIBRA-TABS) 100 MG tablet, Take 1 tablet (100 mg total) by mouth 2 (two) times daily. Marland Kitchen  lithium carbonate (ESKALITH) 450 MG CR tablet, Take 450 mg by mouth at bedtime. .  Solriamfetol HCl (SUNOSI) 75 MG TABS,

## 2020-06-19 ENCOUNTER — Encounter: Payer: Self-pay | Admitting: Internal Medicine

## 2020-07-04 ENCOUNTER — Ambulatory Visit: Payer: Self-pay | Admitting: Surgery

## 2020-07-04 DIAGNOSIS — K4090 Unilateral inguinal hernia, without obstruction or gangrene, not specified as recurrent: Secondary | ICD-10-CM | POA: Diagnosis not present

## 2020-07-04 NOTE — H&P (View-Only) (Signed)
Paul Tucker Appointment: 07/04/2020 3:00 PM Location: [facility] Patient #: [id number] DOB: 03-15-1951 Married / Language: Cleophus Molt / Race: White Male  History of Present Illness Paul Moores A. Mikhia Dusek MD; 07/04/2020 3:55 PM) Patient words: Patient sent at the request of Dr. Jenny Reichmann for a right groin bulge. The patient has noted moderate size right groin bulge that extends down into his right scrotum. It is soft. He has mild pain and mild testicle pain especially with lifting or standing. He has no nausea or vomiting. Bowel function is otherwise normal urinates normally.  The patient is a 70 year old male.   Past Surgical History Janeann Forehand, CNA; 07/04/2020 3:04 PM) Gallbladder Surgery - Laparoscopic Open Inguinal Hernia Surgery Bilateral. Tonsillectomy Vasectomy  Diagnostic Studies History Janeann Forehand, CNA; 07/04/2020 3:04 PM) Colonoscopy 5-10 years ago  Allergies Janeann Forehand, CNA; 07/04/2020 3:04 PM) No Known Drug Allergies [07/04/2020]: Allergies Reconciled  Medication History Janeann Forehand, CNA; 07/04/2020 3:05 PM) Zolpidem Tartrate (10MG  Tablet, Oral) Active. Doxycycline Hyclate (100MG  Tablet, Oral) Active. Lithium Carbonate ER (450MG  Tablet ER, Oral) Active. Lovastatin (40MG  Tablet, Oral) Active. OLANZapine (10MG  Tablet, Oral) Active. Meclizine HCl (25MG  Tablet, Oral) Active. OLANZapine (7.5MG  Tablet, Oral) Active. Synthroid (25MCG Tablet, Oral) Active. OLANZapine (5MG  Tablet, Oral) Active. Tamsulosin HCl (0.4MG  Capsule, Oral) Active. Synthroid (50MCG Tablet, Oral) Active. Trintellix (20MG  Tablet, Oral) Active. Medications Reconciled  Social History Janeann Forehand, CNA; 07/04/2020 3:04 PM) Alcohol use Occasional alcohol use. Caffeine use Carbonated beverages, Coffee, Tea. No drug use Tobacco use Never smoker.  Family History Janeann Forehand, CNA; 07/04/2020 3:04 PM) Family history unknown First Degree  Relatives  Other Problems Janeann Forehand, CNA; 07/04/2020 3:04 PM) Anxiety Disorder Back Pain Cholelithiasis Depression Enlarged Prostate Gastroesophageal Reflux Disease Hemorrhoids Hypercholesterolemia Inguinal Hernia Pancreatitis Sleep Apnea     Review of Systems Adriana Reams Alston CNA; 07/04/2020 3:04 PM) General Not Present- Appetite Loss, Chills, Fatigue, Fever, Night Sweats, Weight Gain and Weight Loss. Skin Not Present- Change in Wart/Mole, Dryness, Hives, Jaundice, New Lesions, Non-Healing Wounds, Rash and Ulcer. HEENT Present- Wears glasses/contact lenses. Not Present- Earache, Hearing Loss, Hoarseness, Nose Bleed, Oral Ulcers, Ringing in the Ears, Seasonal Allergies, Sinus Pain, Sore Throat, Visual Disturbances and Yellow Eyes. Respiratory Not Present- Bloody sputum, Chronic Cough, Difficulty Breathing, Snoring and Wheezing. Breast Not Present- Breast Mass, Breast Pain, Nipple Discharge and Skin Changes. Cardiovascular Not Present- Chest Pain, Difficulty Breathing Lying Down, Leg Cramps, Palpitations, Rapid Heart Rate, Shortness of Breath and Swelling of Extremities. Gastrointestinal Not Present- Abdominal Pain, Bloating, Bloody Stool, Change in Bowel Habits, Chronic diarrhea, Constipation, Difficulty Swallowing, Excessive gas, Gets full quickly at meals, Hemorrhoids, Indigestion, Nausea, Rectal Pain and Vomiting. Male Genitourinary Not Present- Blood in Urine, Change in Urinary Stream, Frequency, Impotence, Nocturia, Painful Urination, Urgency and Urine Leakage. Musculoskeletal Not Present- Back Pain, Joint Pain, Joint Stiffness, Muscle Pain, Muscle Weakness and Swelling of Extremities. Neurological Not Present- Decreased Memory, Fainting, Headaches, Numbness, Seizures, Tingling, Tremor, Trouble walking and Weakness. Psychiatric Present- Anxiety and Depression. Not Present- Bipolar, Change in Sleep Pattern, Fearful and Frequent crying. Endocrine Not Present- Cold  Intolerance, Excessive Hunger, Hair Changes, Heat Intolerance, Hot flashes and New Diabetes. Hematology Not Present- Blood Thinners, Easy Bruising, Excessive bleeding, Gland problems, HIV and Persistent Infections.  Vitals Adriana Reams Alston CNA; 07/04/2020 3:07 PM) 07/04/2020 3:05 PM Weight: 204.38 lb Height: 73in Body Surface Area: 2.17 m Body Mass Index: 26.96 kg/m  Temp.: 98.16F  Pulse: 91 (Regular)  P.OX: 96% (Room air) BP: 140/80(Sitting, Left Arm, Standard)  Physical Exam (Miachel Nardelli A. Okema Rollinson MD; 07/04/2020 3:56 PM)  General Mental Status-Alert. General Appearance-Consistent with stated age. Hydration-Well hydrated. Voice-Normal.  Head and Neck Head-normocephalic, atraumatic with no lesions or palpable masses. Trachea-midline. Thyroid Gland Characteristics - normal size and consistency.  Cardiovascular Cardiovascular examination reveals -normal heart sounds, regular rate and rhythm with no murmurs and normal pedal pulses bilaterally.  Abdomen Note: Reducible moderate size right inguinal scrotal hernia. No evidence of left inguinal hernia.  Neurologic Neurologic evaluation reveals -alert and oriented x 3 with no impairment of recent or remote memory. Mental Status-Normal.  Musculoskeletal Normal Exam - Left-Upper Extremity Strength Normal and Lower Extremity Strength Normal. Normal Exam - Right-Upper Extremity Strength Normal and Lower Extremity Strength Normal.    Assessment & Plan (Christinia Lambeth A. Shyna Duignan MD; 07/04/2020 3:57 PM)  INGUINAL HERNIA OF RIGHT SIDE WITHOUT OBSTRUCTION OR GANGRENE (K40.90) Impression: Discussed laparoscopic and open techniques. Given the large size and inguinal scrotal component, recommend open right inguinal hernia repair with mesh. Of note his previous hernia repairs when he was intending to resume child back in the 1950s. I discussed laparoscopic and open techniques today with him. Discussed the use of  mesh recurrence rates of each as well as Karnes-term expectations and expected outcomes. The risk of hernia repair include bleeding, infection, organ injury, bowel injury, bladder injury, nerve injury recurrent hernia, blood clots, worsening of underlying condition, chronic pain, mesh use, open surgery, death, and the need for other operations. Pt agrees to proceed with open repair of right inguinal hernia with mesh.   total time 30 minutes  Current Plans You are being scheduled for surgery- Our schedulers will call you.  You should hear from our office's scheduling department within 5 working days about the location, date, and time of surgery. We try to make accommodations for patient's preferences in scheduling surgery, but sometimes the OR schedule or the surgeon's schedule prevents [place] from making those accommodations.  If you have not heard from our office [phone]) in 5 working days, call the office and ask for your surgeon's nurse.  If you have other questions about your diagnosis, plan, or surgery, call the office and ask for your surgeon's nurse.  Pt Education - Pamphlet Given - Hernia Surgery: discussed with patient and provided information. Pt Education - CCS Mesh education: discussed with patient and provided information. Pt Education - Consent for inguinal hernia - [place]: discussed with patient and provided information.

## 2020-07-04 NOTE — H&P (Signed)
Paul Tucker Appointment: 07/04/2020 3:00 PM Location: [facility] Patient #: [id number] DOB: 01-Nov-1950 Married / Language: Cleophus Molt / Race: White Male  History of Present Illness Paul Tucker A. Phillis Thackeray MD; 07/04/2020 3:55 PM) Patient words: Patient sent at the request of Dr. Jenny Reichmann for a right groin bulge. The patient has noted moderate size right groin bulge that extends down into his right scrotum. It is soft. He has mild pain and mild testicle pain especially with lifting or standing. He has no nausea or vomiting. Bowel function is otherwise normal urinates normally.  The patient is a 70 year old male.   Past Surgical History Janeann Forehand, CNA; 07/04/2020 3:04 PM) Gallbladder Surgery - Laparoscopic Open Inguinal Hernia Surgery Bilateral. Tonsillectomy Vasectomy  Diagnostic Studies History Janeann Forehand, CNA; 07/04/2020 3:04 PM) Colonoscopy 5-10 years ago  Allergies Janeann Forehand, CNA; 07/04/2020 3:04 PM) No Known Drug Allergies [07/04/2020]: Allergies Reconciled  Medication History Janeann Forehand, CNA; 07/04/2020 3:05 PM) Zolpidem Tartrate (10MG  Tablet, Oral) Active. Doxycycline Hyclate (100MG  Tablet, Oral) Active. Lithium Carbonate ER (450MG  Tablet ER, Oral) Active. Lovastatin (40MG  Tablet, Oral) Active. OLANZapine (10MG  Tablet, Oral) Active. Meclizine HCl (25MG  Tablet, Oral) Active. OLANZapine (7.5MG  Tablet, Oral) Active. Synthroid (25MCG Tablet, Oral) Active. OLANZapine (5MG  Tablet, Oral) Active. Tamsulosin HCl (0.4MG  Capsule, Oral) Active. Synthroid (50MCG Tablet, Oral) Active. Trintellix (20MG  Tablet, Oral) Active. Medications Reconciled  Social History Janeann Forehand, CNA; 07/04/2020 3:04 PM) Alcohol use Occasional alcohol use. Caffeine use Carbonated beverages, Coffee, Tea. No drug use Tobacco use Never smoker.  Family History Janeann Forehand, CNA; 07/04/2020 3:04 PM) Family history unknown First Degree  Relatives  Other Problems Janeann Forehand, CNA; 07/04/2020 3:04 PM) Anxiety Disorder Back Pain Cholelithiasis Depression Enlarged Prostate Gastroesophageal Reflux Disease Hemorrhoids Hypercholesterolemia Inguinal Hernia Pancreatitis Sleep Apnea     Review of Systems Adriana Reams Alston CNA; 07/04/2020 3:04 PM) General Not Present- Appetite Loss, Chills, Fatigue, Fever, Night Sweats, Weight Gain and Weight Loss. Skin Not Present- Change in Wart/Mole, Dryness, Hives, Jaundice, New Lesions, Non-Healing Wounds, Rash and Ulcer. HEENT Present- Wears glasses/contact lenses. Not Present- Earache, Hearing Loss, Hoarseness, Nose Bleed, Oral Ulcers, Ringing in the Ears, Seasonal Allergies, Sinus Pain, Sore Throat, Visual Disturbances and Yellow Eyes. Respiratory Not Present- Bloody sputum, Chronic Cough, Difficulty Breathing, Snoring and Wheezing. Breast Not Present- Breast Mass, Breast Pain, Nipple Discharge and Skin Changes. Cardiovascular Not Present- Chest Pain, Difficulty Breathing Lying Down, Leg Cramps, Palpitations, Rapid Heart Rate, Shortness of Breath and Swelling of Extremities. Gastrointestinal Not Present- Abdominal Pain, Bloating, Bloody Stool, Change in Bowel Habits, Chronic diarrhea, Constipation, Difficulty Swallowing, Excessive gas, Gets full quickly at meals, Hemorrhoids, Indigestion, Nausea, Rectal Pain and Vomiting. Male Genitourinary Not Present- Blood in Urine, Change in Urinary Stream, Frequency, Impotence, Nocturia, Painful Urination, Urgency and Urine Leakage. Musculoskeletal Not Present- Back Pain, Joint Pain, Joint Stiffness, Muscle Pain, Muscle Weakness and Swelling of Extremities. Neurological Not Present- Decreased Memory, Fainting, Headaches, Numbness, Seizures, Tingling, Tremor, Trouble walking and Weakness. Psychiatric Present- Anxiety and Depression. Not Present- Bipolar, Change in Sleep Pattern, Fearful and Frequent crying. Endocrine Not Present- Cold  Intolerance, Excessive Hunger, Hair Changes, Heat Intolerance, Hot flashes and New Diabetes. Hematology Not Present- Blood Thinners, Easy Bruising, Excessive bleeding, Gland problems, HIV and Persistent Infections.  Vitals Adriana Reams Alston CNA; 07/04/2020 3:07 PM) 07/04/2020 3:05 PM Weight: 204.38 lb Height: 73in Body Surface Area: 2.17 m Body Mass Index: 26.96 kg/m  Temp.: 98.16F  Pulse: 91 (Regular)  P.OX: 96% (Room air) BP: 140/80(Sitting, Left Arm, Standard)  Physical Exam (Raynetta Osterloh A. Levent Kornegay MD; 07/04/2020 3:56 PM)  General Mental Status-Alert. General Appearance-Consistent with stated age. Hydration-Well hydrated. Voice-Normal.  Head and Neck Head-normocephalic, atraumatic with no lesions or palpable masses. Trachea-midline. Thyroid Gland Characteristics - normal size and consistency.  Cardiovascular Cardiovascular examination reveals -normal heart sounds, regular rate and rhythm with no murmurs and normal pedal pulses bilaterally.  Abdomen Note: Reducible moderate size right inguinal scrotal hernia. No evidence of left inguinal hernia.  Neurologic Neurologic evaluation reveals -alert and oriented x 3 with no impairment of recent or remote memory. Mental Status-Normal.  Musculoskeletal Normal Exam - Left-Upper Extremity Strength Normal and Lower Extremity Strength Normal. Normal Exam - Right-Upper Extremity Strength Normal and Lower Extremity Strength Normal.    Assessment & Plan (Jacquese Hackman A. Destane Speas MD; 07/04/2020 3:57 PM)  INGUINAL HERNIA OF RIGHT SIDE WITHOUT OBSTRUCTION OR GANGRENE (K40.90) Impression: Discussed laparoscopic and open techniques. Given the large size and inguinal scrotal component, recommend open right inguinal hernia repair with mesh. Of note his previous hernia repairs when he was intending to resume child back in the 1950s. I discussed laparoscopic and open techniques today with him. Discussed the use of  mesh recurrence rates of each as well as Spieler-term expectations and expected outcomes. The risk of hernia repair include bleeding, infection, organ injury, bowel injury, bladder injury, nerve injury recurrent hernia, blood clots, worsening of underlying condition, chronic pain, mesh use, open surgery, death, and the need for other operations. Pt agrees to proceed with open repair of right inguinal hernia with mesh.   total time 30 minutes  Current Plans You are being scheduled for surgery- Our schedulers will call you.  You should hear from our office's scheduling department within 5 working days about the location, date, and time of surgery. We try to make accommodations for patient's preferences in scheduling surgery, but sometimes the OR schedule or the surgeon's schedule prevents [place] from making those accommodations.  If you have not heard from our office [phone]) in 5 working days, call the office and ask for your surgeon's nurse.  If you have other questions about your diagnosis, plan, or surgery, call the office and ask for your surgeon's nurse.  Pt Education - Pamphlet Given - Hernia Surgery: discussed with patient and provided information. Pt Education - CCS Mesh education: discussed with patient and provided information. Pt Education - Consent for inguinal hernia - K[place]inger: discussed with patient and provided information.

## 2020-07-12 ENCOUNTER — Other Ambulatory Visit (INDEPENDENT_AMBULATORY_CARE_PROVIDER_SITE_OTHER): Payer: PPO

## 2020-07-12 ENCOUNTER — Other Ambulatory Visit: Payer: Self-pay

## 2020-07-12 ENCOUNTER — Encounter: Payer: Self-pay | Admitting: Internal Medicine

## 2020-07-12 DIAGNOSIS — E785 Hyperlipidemia, unspecified: Secondary | ICD-10-CM

## 2020-07-12 DIAGNOSIS — Z0000 Encounter for general adult medical examination without abnormal findings: Secondary | ICD-10-CM | POA: Diagnosis not present

## 2020-07-12 DIAGNOSIS — E538 Deficiency of other specified B group vitamins: Secondary | ICD-10-CM | POA: Diagnosis not present

## 2020-07-12 DIAGNOSIS — E559 Vitamin D deficiency, unspecified: Secondary | ICD-10-CM

## 2020-07-12 LAB — COMPREHENSIVE METABOLIC PANEL
ALT: 20 U/L (ref 0–53)
AST: 17 U/L (ref 0–37)
Albumin: 4.4 g/dL (ref 3.5–5.2)
Alkaline Phosphatase: 63 U/L (ref 39–117)
BUN: 20 mg/dL (ref 6–23)
CO2: 25 mEq/L (ref 19–32)
Calcium: 9.5 mg/dL (ref 8.4–10.5)
Chloride: 103 mEq/L (ref 96–112)
Creatinine, Ser: 1.13 mg/dL (ref 0.40–1.50)
GFR: 66.65 mL/min (ref >60.00)
Glucose, Bld: 149 mg/dL — ABNORMAL HIGH (ref 70–99)
Potassium: 3.8 mEq/L (ref 3.5–5.1)
Sodium: 138 mEq/L (ref 135–145)
Total Bilirubin: 1.2 mg/dL (ref 0.2–1.2)
Total Protein: 7.2 g/dL (ref 6.0–8.3)

## 2020-07-12 LAB — CBC WITH DIFFERENTIAL/PLATELET
Basophils Absolute: 0 10*3/uL (ref 0.0–0.1)
Basophils Relative: 0.7 % (ref 0.0–3.0)
Eosinophils Absolute: 0.1 10*3/uL (ref 0.0–0.7)
Eosinophils Relative: 2.5 % (ref 0.0–5.0)
HCT: 38.4 % — ABNORMAL LOW (ref 39.0–52.0)
Hemoglobin: 13.6 g/dL (ref 13.0–17.0)
Lymphocytes Relative: 18.7 % (ref 12.0–46.0)
Lymphs Abs: 1 10*3/uL (ref 0.7–4.0)
MCHC: 35.4 g/dL (ref 30.0–36.0)
MCV: 96.9 fl (ref 78.0–100.0)
Monocytes Absolute: 0.4 10*3/uL (ref 0.1–1.0)
Monocytes Relative: 6.7 % (ref 3.0–12.0)
Neutro Abs: 3.8 10*3/uL (ref 1.4–7.7)
Neutrophils Relative %: 71.4 % (ref 43.0–77.0)
Platelets: 192 10*3/uL (ref 150.0–400.0)
RBC: 3.96 Mil/uL — ABNORMAL LOW (ref 4.22–5.81)
RDW: 12.9 % (ref 11.5–15.5)
WBC: 5.4 10*3/uL (ref 4.0–10.5)

## 2020-07-12 LAB — URINALYSIS, ROUTINE W REFLEX MICROSCOPIC
Bilirubin Urine: NEGATIVE
Hgb urine dipstick: NEGATIVE
Ketones, ur: NEGATIVE
Leukocytes,Ua: NEGATIVE
Nitrite: NEGATIVE
Specific Gravity, Urine: 1.025 (ref 1.000–1.030)
Total Protein, Urine: NEGATIVE
Urine Glucose: NEGATIVE
Urobilinogen, UA: 0.2 (ref 0.0–1.0)
pH: 6 (ref 5.0–8.0)

## 2020-07-12 LAB — PSA: PSA: 4.36 ng/mL — ABNORMAL HIGH (ref 0.10–4.00)

## 2020-07-12 LAB — VITAMIN B12: Vitamin B-12: 294 pg/mL (ref 211–911)

## 2020-07-12 LAB — TSH: TSH: 1.3 u[IU]/mL (ref 0.35–4.50)

## 2020-07-12 LAB — VITAMIN D 25 HYDROXY (VIT D DEFICIENCY, FRACTURES): VITD: 40.37 ng/mL (ref 30.00–100.00)

## 2020-07-12 NOTE — Addendum Note (Signed)
Addended by: Boris Lown B on: 07/12/2020 08:48 AM   Modules accepted: Orders

## 2020-07-12 NOTE — Telephone Encounter (Signed)
Staff to contact lab  Need addon to lab - Hgba1c - for hyperglycemia  please

## 2020-07-19 ENCOUNTER — Other Ambulatory Visit: Payer: Self-pay

## 2020-07-20 ENCOUNTER — Encounter: Payer: Self-pay | Admitting: Internal Medicine

## 2020-07-20 ENCOUNTER — Ambulatory Visit (INDEPENDENT_AMBULATORY_CARE_PROVIDER_SITE_OTHER): Payer: PPO | Admitting: Internal Medicine

## 2020-07-20 VITALS — BP 120/72 | HR 75 | Ht 73.0 in | Wt 202.0 lb

## 2020-07-20 DIAGNOSIS — E559 Vitamin D deficiency, unspecified: Secondary | ICD-10-CM | POA: Diagnosis not present

## 2020-07-20 DIAGNOSIS — F3289 Other specified depressive episodes: Secondary | ICD-10-CM | POA: Diagnosis not present

## 2020-07-20 DIAGNOSIS — E538 Deficiency of other specified B group vitamins: Secondary | ICD-10-CM

## 2020-07-20 DIAGNOSIS — Z0000 Encounter for general adult medical examination without abnormal findings: Secondary | ICD-10-CM | POA: Diagnosis not present

## 2020-07-20 DIAGNOSIS — K4090 Unilateral inguinal hernia, without obstruction or gangrene, not specified as recurrent: Secondary | ICD-10-CM | POA: Diagnosis not present

## 2020-07-20 DIAGNOSIS — Z23 Encounter for immunization: Secondary | ICD-10-CM | POA: Diagnosis not present

## 2020-07-20 DIAGNOSIS — E1340 Other specified diabetes mellitus with diabetic neuropathy, unspecified: Secondary | ICD-10-CM

## 2020-07-20 DIAGNOSIS — E785 Hyperlipidemia, unspecified: Secondary | ICD-10-CM | POA: Diagnosis not present

## 2020-07-20 DIAGNOSIS — R972 Elevated prostate specific antigen [PSA]: Secondary | ICD-10-CM | POA: Diagnosis not present

## 2020-07-20 DIAGNOSIS — Z0001 Encounter for general adult medical examination with abnormal findings: Secondary | ICD-10-CM

## 2020-07-20 LAB — LIPID PANEL
Cholesterol: 117 mg/dL (ref 0–200)
HDL: 43.2 mg/dL (ref >39.00)
LDL Cholesterol: 45 mg/dL (ref 0–99)
NonHDL: 73.51
Total CHOL/HDL Ratio: 3
Triglycerides: 143 mg/dL (ref 0.0–149.0)
VLDL: 28.6 mg/dL (ref 0.0–40.0)

## 2020-07-20 LAB — COMPREHENSIVE METABOLIC PANEL
ALT: 21 U/L (ref 0–53)
AST: 19 U/L (ref 0–37)
Albumin: 4.5 g/dL (ref 3.5–5.2)
Alkaline Phosphatase: 60 U/L (ref 39–117)
BUN: 16 mg/dL (ref 6–23)
CO2: 27 mEq/L (ref 19–32)
Calcium: 9.8 mg/dL (ref 8.4–10.5)
Chloride: 103 mEq/L (ref 96–112)
Creatinine, Ser: 1.04 mg/dL (ref 0.40–1.50)
GFR: 73.61 mL/min (ref >60.00)
Glucose, Bld: 150 mg/dL — ABNORMAL HIGH (ref 70–99)
Potassium: 3.9 mEq/L (ref 3.5–5.1)
Sodium: 138 mEq/L (ref 135–145)
Total Bilirubin: 1 mg/dL (ref 0.2–1.2)
Total Protein: 7.5 g/dL (ref 6.0–8.3)

## 2020-07-20 LAB — HEMOGLOBIN A1C: Hgb A1c MFr Bld: 5.6 % (ref 4.6–6.5)

## 2020-07-20 LAB — MICROALBUMIN / CREATININE URINE RATIO
Creatinine,U: 176.2 mg/dL
Microalb Creat Ratio: 1.1 mg/g (ref 0.0–30.0)
Microalb, Ur: 1.9 mg/dL (ref 0.0–1.9)

## 2020-07-20 NOTE — Progress Notes (Signed)
Patient ID: Paul Tucker, male   DOB: [birth year], 70 y.o.   MRN: [id number]         Chief Complaint:: wellness exam and Follow-up general debility, right inguinal hernia, dm, depression,  b12 deficienc, hld, increased psa, vit d deficiency       HPI:  Paul Tucker is a 70 y.o. male here for wellness exam; declines Tdap but ok for pneumovax, o/w up to date with preventive referrals and immunizations                        Also sees Dr Toy Care for chronic depression overall ok on current med tx.  Denies worsening depressive symptoms, suicidal ideation, or panic; has ongoing anxiety, not increased recently.    Pt denies polydipsia, polyuria, Pt denies chest pain, increased sob or doe, wheezing, orthopnea, PND, increased LE swelling, palpitations, dizziness or syncope.  Also has appt next wk with general surgury for recently more symptomatic right inguinal hernia.  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  Has not been taking b12 recentlty.  Has no new focal neuro s/s.   Pt denies fever, wt loss, night sweats, loss of appetite, or other constitutional symptoms  No other new complaints  Denies urinary symptoms such as dysuria, frequency, urgency, flank pain, hematuria or n/v, fever, chills.   Wt Readings from Last 3 Encounters:  07/27/20 200 lb (90.7 kg)  07/20/20 202 lb (91.6 kg)  06/09/20 206 lb (93.4 kg)   BP Readings from Last 3 Encounters:  07/27/20 114/69  07/20/20 120/72  06/09/20 138/72   Immunization History  Administered Date(s) Administered  . Fluad Quad(high Dose 65+) 02/28/2019, 03/09/2020  . Influenza Whole 02/28/2006, 04/04/2007  . Influenza, High Dose Seasonal PF 03/06/2017, 02/07/2018  . Influenza,inj,Quad PF,6+ Mos 02/25/2014, 03/10/2015, 03/13/2016  . Influenza-Unspecified 02/19/2012  . PFIZER(Purple Top)SARS-COV-2 Vaccination 06/25/2019, 07/20/2019, 02/15/2020  . Pneumococcal Conjugate-13 03/06/2017  .  Pneumococcal Polysaccharide-23 02/25/2014, 07/20/2020  . Td 12/02/2008  . Zoster 12/04/2011  . Zoster Recombinat (Shingrix) 07/24/2018, 11/26/2018   Health Maintenance Due  Topic Date Due  . TETANUS/TDAP  12/03/2018      Past Medical History:  Diagnosis Date  . Acute prostatitis 04/04/2007  . ADD 01/17/2007  . Anxiety   . DEPRESSION 01/17/2007   Treatment Resident Depression  . Dermatophytosis of groin and perianal area 05/31/2008  . DIVERTICULOSIS, COLON 04/06/2007  . Dysuria 01/27/2009  . FLANK PAIN, LEFT 09/30/2007  . Flushing 08/26/2009  . GLUCOSE INTOLERANCE 04/04/2007  . Gross hematuria 01/27/2009  . HEMATOCHEZIA 07/26/2008  . HYPERLIPIDEMIA 01/20/2007  . HYPOGONADISM 06/13/2009  . Impaired glucose tolerance 12/10/2010  . INSOMNIA 04/04/2007  . LIBIDO, DECREASED 12/02/2008  . 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 LOW BACK PAIN 01/20/2007  . OBSTRUCTIVE SLEEP APNEA 04/04/2007  . OTITIS MEDIA, ACUTE, BILATERAL 06/13/2009  . PANCREATITIS, HX OF 01/17/2007  . PSA, INCREASED 11/18/2009  . RASH-NONVESICULAR 08/26/2009  . SINUSITIS- ACUTE-NOS 04/04/2007  . SWEATING 09/30/2007  . THRUSH 04/04/2007  . TONSILLECTOMY AND ADENOIDECTOMY, HX OF 01/17/2007  . URI 07/25/2010  . URINARY RETENTION 01/27/2009  . VERTIGO 06/13/2009   Past Surgical History:  Procedure Laterality Date  . CHOLECYSTECTOMY  2005  . HEMORRHOID BANDING  2015  . HERNIA REPAIR  1957  . TONSILLECTOMY    . VASECTOMY  1987    reports that he has never smoked. He has never used smokeless tobacco.  He reports that he does not drink alcohol and does not use drugs. family history includes Coronary artery disease in an other family member; Diabetes in an other family member; Emphysema in his father; Heart disease in his father. Allergies  Allergen Reactions  . Atrovent [Ipratropium] Swelling    Throat swelling  . Pravastatin Sodium Nausea Only    Pt does not recall this reaction   Current Outpatient Medications on File Prior to Visit   Medication Sig Dispense Refill  . Armodafinil 150 MG tablet Take 150 mg by mouth daily.    Marland Kitchen aspirin 81 MG EC tablet Take 81 mg by mouth daily.    . Ciclopirox 0.77 % gel USE AS DIRECTED TOPCIALLY 45 g 1  . diphenhydrAMINE (BENADRYL) 25 MG tablet Take 25 mg by mouth every 6 (six) hours as needed for allergies.    Marland Kitchen doxycycline (VIBRA-TABS) 100 MG tablet Take 1 tablet (100 mg total) by mouth 2 (two) times daily. 60 tablet 0  . fluocinonide cream (LIDEX) 3.71 % Apply 1 application topically 2 (two) times daily. 30 g 0  . lithium carbonate (ESKALITH) 450 MG CR tablet Take 450 mg by mouth at bedtime.    . lovastatin (MEVACOR) 40 MG tablet Take 1 tablet (40 mg total) by mouth at bedtime. 90 tablet 3  . meclizine (ANTIVERT) 25 MG tablet TAKE ONE TABLET BY MOUTH THREE TIMES A DAY AS NEEDED FOR DIZZINESS 30 tablet 2  . mometasone (ELOCON) 0.1 % cream Apply 1 application topically daily. 45 g 0  . OLANZapine (ZYPREXA) 5 MG tablet Take 5 mg by mouth at bedtime.    . Solriamfetol HCl (SUNOSI) 75 MG TABS     . SYNTHROID 25 MCG tablet Take 25 mcg by mouth daily before breakfast.    . tamsulosin (FLOMAX) 0.4 MG CAPS capsule Take 1 capsule (0.4 mg total) by mouth daily. 90 capsule 3  . TRINTELLIX 20 MG TABS tablet Take 20 mg by mouth daily.    Marland Kitchen zolpidem (AMBIEN) 10 MG tablet TAKE ONE TABLET BY MOUTH EVERY NIGHT AT BEDTIME (Patient taking differently: Take 10 mg by mouth at bedtime.) 90 tablet 1  . HYDROcodone-acetaminophen (NORCO/VICODIN) 5-325 MG tablet Take 1 tablet by mouth every 6 (six) hours as needed for moderate pain. 15 tablet 0  . ibuprofen (ADVIL) 800 MG tablet Take 1 tablet (800 mg total) by mouth every 8 (eight) hours as needed. 30 tablet 0  . LORazepam (ATIVAN) 0.5 MG tablet Take 0.5 mg by mouth 2 (two) times daily as needed.     No current facility-administered medications on file prior to visit.        ROS:  All others reviewed and negative.  Objective        PE:  BP 120/72   Pulse 75    Ht 6\' 1"  (1.854 m)   Wt 202 lb (91.6 kg)   SpO2 94%   BMI 26.65 kg/m                 Constitutional: Pt appears in NAD               HENT: Head: NCAT.                Right Ear: External ear normal.                 Left Ear: External ear normal.                Eyes: .  Pupils are equal, round, and reactive to light. Conjunctivae and EOM are normal               Nose: without d/c or deformity               Neck: Neck supple. Gross normal ROM               Cardiovascular: Normal rate and regular rhythm.                 Pulmonary/Chest: Effort normal and breath sounds without rales or wheezing.                Abd:  Soft, NT, ND, + BS, no organomegaly               Neurological: Pt is alert. At baseline orientation, motor grossly intact               Skin: Skin is warm. No rashes, no other new lesions, LE edema - none               Psychiatric: Pt behavior is normal without agitation   Micro: none  Cardiac tracings I have personally interpreted today:  none  Pertinent Radiological findings (summarize): none   Lab Results  Component Value Date   WBC 5.7 07/27/2020   HGB 14.6 07/27/2020   HCT 41.6 07/27/2020   PLT 212 07/27/2020   GLUCOSE 174 (H) 07/27/2020   CHOL 117 07/20/2020   TRIG 143.0 07/20/2020   HDL 43.20 07/20/2020   LDLDIRECT 82.0 02/28/2017   LDLCALC 45 07/20/2020   ALT 23 07/27/2020   AST 21 07/27/2020   NA 135 07/27/2020   K 4.1 07/27/2020   CL 101 07/27/2020   CREATININE 1.09 07/27/2020   BUN 15 07/27/2020   CO2 25 07/27/2020   TSH 1.30 07/12/2020   PSA 4.36 (H) 07/12/2020   HGBA1C 5.6 07/20/2020   MICROALBUR 1.9 07/20/2020   Assessment/Plan:  Paul Tucker is a 70 y.o. White or Caucasian [1] male with  has a past medical history of Acute prostatitis (04/04/2007), ADD (01/17/2007), Anxiety, DEPRESSION (01/17/2007), Dermatophytosis of groin and perianal area (05/31/2008), DIVERTICULOSIS, COLON (04/06/2007), Dysuria (01/27/2009), FLANK PAIN, LEFT (09/30/2007),  Flushing (08/26/2009), GLUCOSE INTOLERANCE (04/04/2007), Gross hematuria (01/27/2009), HEMATOCHEZIA (07/26/2008), HYPERLIPIDEMIA (01/20/2007), HYPOGONADISM (06/13/2009), Impaired glucose tolerance (12/10/2010), INSOMNIA (04/04/2007), LIBIDO, DECREASED (6/60/6301), LICHEN SIMPLEX CHRONICUS (05/31/2008), LOW BACK PAIN (01/20/2007), OBSTRUCTIVE SLEEP APNEA (04/04/2007), OTITIS MEDIA, ACUTE, BILATERAL (06/13/2009), PANCREATITIS, HX OF (01/17/2007), PSA, INCREASED (11/18/2009), RASH-NONVESICULAR (08/26/2009), SINUSITIS- ACUTE-NOS (04/04/2007), SWEATING (09/30/2007), THRUSH (04/04/2007), TONSILLECTOMY AND ADENOIDECTOMY, HX OF (01/17/2007), URI (07/25/2010), URINARY RETENTION (01/27/2009), and VERTIGO (06/13/2009).  PSA, INCREASED Has appt to f/u urology next month, will also plan on f/u psa at 6 months  Encounter for well adult exam with abnormal findings Age and sex appropriate education and counseling updated with regular exercise and diet Referrals for preventative services - none needed Immunizations addressed - for pneumovax today, declines Tdap Smoking counseling  - none needed Evidence for depression or other mood disorder - none significant Most recent labs reviewed. I have personally reviewed and have noted: 1) the patient's medical and social history 2) The patient's current medications and supplements 3) The patient's height, weight, and BMI have been recorded in the chart   B12 deficiency Lab Results  Component Value Date   VITAMINB12 294 07/12/2020   Stable but low normal, to start oral replacement - b12 1000 mcg qd   Depression Stable, cont current  med tx, f/u with psychiatry as planned - lithium, zyprexa, sunosi, trintelix   Current Outpatient Medications (Endocrine & Metabolic):  .  SYNTHROID 25 MCG tablet, Take 25 mcg by mouth daily before breakfast.  Current Outpatient Medications (Cardiovascular):  .  lovastatin (MEVACOR) 40 MG tablet, Take 1 tablet (40 mg total) by mouth at bedtime.  Current  Outpatient Medications (Respiratory):  .  diphenhydrAMINE (BENADRYL) 25 MG tablet, Take 25 mg by mouth every 6 (six) hours as needed for allergies.  Current Outpatient Medications (Analgesics):  .  aspirin 81 MG EC tablet, Take 81 mg by mouth daily. Marland Kitchen  HYDROcodone-acetaminophen (NORCO/VICODIN) 5-325 MG tablet, Take 1 tablet by mouth every 6 (six) hours as needed for moderate pain. Marland Kitchen  ibuprofen (ADVIL) 800 MG tablet, Take 1 tablet (800 mg total) by mouth every 8 (eight) hours as needed.   Current Outpatient Medications (Other):  Marland Kitchen  Armodafinil 150 MG tablet, Take 150 mg by mouth daily. .  Ciclopirox 0.77 % gel, USE AS DIRECTED TOPCIALLY .  doxycycline (VIBRA-TABS) 100 MG tablet, Take 1 tablet (100 mg total) by mouth 2 (two) times daily. .  fluocinonide cream (LIDEX) 9.56 %, Apply 1 application topically 2 (two) times daily. Marland Kitchen  lithium carbonate (ESKALITH) 450 MG CR tablet, Take 450 mg by mouth at bedtime. .  meclizine (ANTIVERT) 25 MG tablet, TAKE ONE TABLET BY MOUTH THREE TIMES A DAY AS NEEDED FOR DIZZINESS .  mometasone (ELOCON) 0.1 % cream, Apply 1 application topically daily. Marland Kitchen  OLANZapine (ZYPREXA) 5 MG tablet, Take 5 mg by mouth at bedtime. .  Solriamfetol HCl (SUNOSI) 75 MG TABS,  .  tamsulosin (FLOMAX) 0.4 MG CAPS capsule, Take 1 capsule (0.4 mg total) by mouth daily. .  TRINTELLIX 20 MG TABS tablet, Take 20 mg by mouth daily. Marland Kitchen  zolpidem (AMBIEN) 10 MG tablet, TAKE ONE TABLET BY MOUTH EVERY NIGHT AT BEDTIME (Patient taking differently: Take 10 mg by mouth at bedtime.) .  LORazepam (ATIVAN) 0.5 MG tablet, Take 0.5 mg by mouth 2 (two) times daily as needed.   Diabetes with neurologic complications [facility]) Lab Results  Component Value Date   HGBA1C 5.6 07/20/2020   Stable, pt to continue current medical treatment  - diet and wt control   Hyperlipidemia Lab Results  Component Value Date   LDLCALC 45 07/20/2020   Stable, pt to continue current statin lovastatin   Right  inguinal hernia With inreased symptoms pain, swelling recently  - for f/u general surgury  Vitamin D deficiency Last vitamin D Lab Results  Component Value Date   VD25OH 40.37 07/12/2020   Stable, cont oral replacement  Followup: Return in about 6 months (around 01/20/2021).  Cathlean Cower, MD 07/27/2020 11:34 PM [facility]

## 2020-07-20 NOTE — Assessment & Plan Note (Signed)
Has appt to f/u urology next month, will also plan on f/u psa at 6 months

## 2020-07-20 NOTE — Addendum Note (Signed)
Addended by: Jacobo Forest on: 07/20/2020 09:14 AM   Modules accepted: Orders

## 2020-07-20 NOTE — Patient Instructions (Addendum)
You had the pneumovax pneumonia shot today  You are given the handicapped parking application signed today  Please continue all other medications as before, and refills have been done if requested.  Please have the pharmacy call with any other refills you may need.  Please continue your efforts at being more active, low cholesterol diet, and weight control.  You are otherwise up to date with prevention measures today.  Please keep your appointments with your specialists as you may have planned  Please go to the LAB at the blood drawing area for the tests to be done - just the A1c today  You will be contacted by phone if any changes need to be made immediately.  Otherwise, you will receive a letter about your results with an explanation, but please check with MyChart first.  Please remember to sign up for MyChart if you have not done so, as this will be important to you in the future with finding out test results, communicating by private email, and scheduling acute appointments online when needed.  Please make an Appointment to return in 6 months, or sooner if needed, also with Lab Appointment for testing done 3-5 days before at the [facility] (so this is for TWO appointments - please see the scheduling desk as you leave)  Due to the ongoing Covid 19 pandemic, our lab now requires an appointment for any labs done at our office.  If you need labs done and do not have an appointment, please call our office ahead of time to schedule before presenting to the lab for your testing  Good Luck with your Hernia Surgury next week!Marland Kitchen

## 2020-07-25 ENCOUNTER — Other Ambulatory Visit (HOSPITAL_COMMUNITY)
Admission: RE | Admit: 2020-07-25 | Discharge: 2020-07-25 | Disposition: A | Discharge status: Home / Self Care | Payer: PPO | Source: Ambulatory Visit | Attending: Surgery | Admitting: Surgery

## 2020-07-25 DIAGNOSIS — Z20822 Contact with and (suspected) exposure to covid-19: Secondary | ICD-10-CM | POA: Insufficient documentation

## 2020-07-25 DIAGNOSIS — Z01812 Encounter for preprocedural laboratory examination: Secondary | ICD-10-CM | POA: Insufficient documentation

## 2020-07-25 LAB — SARS CORONAVIRUS 2 (TAT 6-24 HRS): SARS Coronavirus 2: NEGATIVE

## 2020-07-26 ENCOUNTER — Other Ambulatory Visit: Payer: Self-pay

## 2020-07-26 ENCOUNTER — Ambulatory Visit: Payer: PPO | Admitting: Internal Medicine

## 2020-07-26 ENCOUNTER — Encounter (HOSPITAL_COMMUNITY): Payer: Self-pay | Admitting: Surgery

## 2020-07-26 NOTE — Progress Notes (Addendum)
[name] denies chest pain or shortness of breath. Patient  tested  Negative 3/7/22for Covid and has been in quarantine since that time. [name] will have his wife call back and review mediactions and instructions. I spoke with Mrs. Paul Tucker and gave instructions.

## 2020-07-26 NOTE — Anesthesia Preprocedure Evaluation (Signed)
Anesthesia Evaluation  Patient identified by MRN, date of birth, ID band Patient awake    Reviewed: Allergy & Precautions, NPO status , Patient's Chart, lab work & pertinent test results  Airway Mallampati: II  TM Distance: >3 FB Neck ROM: Full    Dental no notable dental hx. (+) Dental Advisory Given, Teeth Intact   Pulmonary sleep apnea ,    Pulmonary exam normal breath sounds clear to auscultation       Cardiovascular negative cardio ROS Normal cardiovascular exam Rhythm:Regular Rate:Normal     Neuro/Psych PSYCHIATRIC DISORDERS Anxiety Depression negative neurological ROS     GI/Hepatic Neg liver ROS, GERD  ,  Endo/Other  diabetes  Renal/GU negative Renal ROS     Musculoskeletal negative musculoskeletal ROS (+)   Abdominal   Peds  Hematology negative hematology ROS (+)   Anesthesia Other Findings   Reproductive/Obstetrics                            Anesthesia Physical Anesthesia Plan  ASA: II  Anesthesia Plan: General   Post-op Pain Management: GA combined w/ Regional for post-op pain   Induction: Intravenous  PONV Risk Score and Plan: 4 or greater and Ondansetron, Dexamethasone, Midazolam, Treatment may vary due to age or medical condition and Diphenhydramine  Airway Management Planned: Oral ETT  Additional Equipment: None  Intra-op Plan:   Post-operative Plan: Extubation in OR  Informed Consent: I have reviewed the patients History and Physical, chart, labs and discussed the procedure including the risks, benefits and alternatives for the proposed anesthesia with the patient or authorized representative who has indicated his/her understanding and acceptance.     Dental advisory given  Plan Discussed with: CRNA  Anesthesia Plan Comments:        Anesthesia Quick Evaluation

## 2020-07-27 ENCOUNTER — Encounter: Payer: Self-pay | Admitting: Internal Medicine

## 2020-07-27 ENCOUNTER — Ambulatory Visit (HOSPITAL_COMMUNITY): Payer: PPO | Admitting: Certified Registered"

## 2020-07-27 ENCOUNTER — Ambulatory Visit (HOSPITAL_COMMUNITY)
Admission: RE | Admit: 2020-07-27 | Discharge: 2020-07-27 | Disposition: A | Discharge status: Home / Self Care | Payer: PPO | Attending: Surgery | Admitting: Surgery

## 2020-07-27 ENCOUNTER — Encounter (HOSPITAL_COMMUNITY)
Admission: RE | Disposition: A | Discharge status: Home / Self Care | Payer: Self-pay | Source: Home / Self Care | Attending: Surgery

## 2020-07-27 ENCOUNTER — Other Ambulatory Visit: Payer: Self-pay

## 2020-07-27 ENCOUNTER — Encounter (HOSPITAL_COMMUNITY): Payer: Self-pay | Admitting: Surgery

## 2020-07-27 DIAGNOSIS — E785 Hyperlipidemia, unspecified: Secondary | ICD-10-CM | POA: Diagnosis not present

## 2020-07-27 DIAGNOSIS — Z79890 Hormone replacement therapy (postmenopausal): Secondary | ICD-10-CM | POA: Diagnosis not present

## 2020-07-27 DIAGNOSIS — E559 Vitamin D deficiency, unspecified: Secondary | ICD-10-CM | POA: Diagnosis not present

## 2020-07-27 DIAGNOSIS — K4090 Unilateral inguinal hernia, without obstruction or gangrene, not specified as recurrent: Secondary | ICD-10-CM | POA: Insufficient documentation

## 2020-07-27 DIAGNOSIS — Z79899 Other long term (current) drug therapy: Secondary | ICD-10-CM | POA: Diagnosis not present

## 2020-07-27 DIAGNOSIS — J309 Allergic rhinitis, unspecified: Secondary | ICD-10-CM | POA: Diagnosis not present

## 2020-07-27 HISTORY — DX: Anxiety disorder, unspecified: F41.9

## 2020-07-27 HISTORY — PX: INGUINAL HERNIA REPAIR: SHX194

## 2020-07-27 LAB — CBC WITH DIFFERENTIAL/PLATELET
Abs Immature Granulocytes: 0.01 10*3/uL (ref 0.00–0.07)
Basophils Absolute: 0 10*3/uL (ref 0.0–0.1)
Basophils Relative: 1 %
Eosinophils Absolute: 0.2 10*3/uL (ref 0.0–0.5)
Eosinophils Relative: 3 %
HCT: 41.6 % (ref 39.0–52.0)
Hemoglobin: 14.6 g/dL (ref 13.0–17.0)
Immature Granulocytes: 0 %
Lymphocytes Relative: 20 %
Lymphs Abs: 1.1 10*3/uL (ref 0.7–4.0)
MCH: 35.1 pg — ABNORMAL HIGH (ref 26.0–34.0)
MCHC: 35.1 g/dL (ref 30.0–36.0)
MCV: 100 fL (ref 80.0–100.0)
Monocytes Absolute: 0.5 10*3/uL (ref 0.1–1.0)
Monocytes Relative: 8 %
Neutro Abs: 3.9 10*3/uL (ref 1.7–7.7)
Neutrophils Relative %: 68 %
Platelets: 212 10*3/uL (ref 150–400)
RBC: 4.16 MIL/uL — ABNORMAL LOW (ref 4.22–5.81)
RDW: 12.4 % (ref 11.5–15.5)
WBC: 5.7 10*3/uL (ref 4.0–10.5)
nRBC: 0 % (ref 0.0–0.2)

## 2020-07-27 LAB — COMPREHENSIVE METABOLIC PANEL
ALT: 23 U/L (ref 0–44)
AST: 21 U/L (ref 15–41)
Albumin: 4 g/dL (ref 3.5–5.0)
Alkaline Phosphatase: 60 U/L (ref 38–126)
Anion gap: 9 (ref 5–15)
BUN: 15 mg/dL (ref 8–23)
CO2: 25 mmol/L (ref 22–32)
Calcium: 9.4 mg/dL (ref 8.9–10.3)
Chloride: 101 mmol/L (ref 98–111)
Creatinine, Ser: 1.09 mg/dL (ref 0.61–1.24)
GFR, Estimated: >60 mL/min (ref >60)
Glucose, Bld: 174 mg/dL — ABNORMAL HIGH (ref 70–99)
Potassium: 4.1 mmol/L (ref 3.5–5.1)
Sodium: 135 mmol/L (ref 135–145)
Total Bilirubin: 1 mg/dL (ref 0.3–1.2)
Total Protein: 6.9 g/dL (ref 6.5–8.1)

## 2020-07-27 SURGERY — REPAIR, HERNIA, INGUINAL, ADULT
Anesthesia: General | Laterality: Right

## 2020-07-27 MED ORDER — DEXAMETHASONE SODIUM PHOSPHATE 10 MG/ML IJ SOLN
INTRAMUSCULAR | Status: DC | PRN
  Administered 2020-07-27: 10 mg via INTRAVENOUS

## 2020-07-27 MED ORDER — CHLORHEXIDINE GLUCONATE 0.12 % MT SOLN: 15.0000 mL | Freq: Once | OROMUCOSAL | Status: AC

## 2020-07-27 MED ORDER — CHLORHEXIDINE GLUCONATE 0.12 % MT SOLN
OROMUCOSAL | Status: AC
  Administered 2020-07-27: 15 mL via OROMUCOSAL
  Filled 2020-07-27: qty 15

## 2020-07-27 MED ORDER — IBUPROFEN 800 MG PO TABS: 800.0000 mg | ORAL_TABLET | Freq: Three times a day (TID) | ORAL | 0 refills | Status: DC | PRN

## 2020-07-27 MED ORDER — BUPIVACAINE HCL (PF) 0.25 % IJ SOLN
INTRAMUSCULAR | Status: AC
  Filled 2020-07-27: qty 30

## 2020-07-27 MED ORDER — MIDAZOLAM HCL 5 MG/5ML IJ SOLN
INTRAMUSCULAR | Status: DC | PRN
  Administered 2020-07-27: 2 mg via INTRAVENOUS

## 2020-07-27 MED ORDER — OXYCODONE HCL 5 MG/5ML PO SOLN: 5.0000 mg | Freq: Once | ORAL | Status: DC | PRN

## 2020-07-27 MED ORDER — BUPIVACAINE HCL (PF) 0.25 % IJ SOLN
INTRAMUSCULAR | Status: DC | PRN
  Administered 2020-07-27: 10 mL

## 2020-07-27 MED ORDER — ORAL CARE MOUTH RINSE: 15.0000 mL | Freq: Once | OROMUCOSAL | Status: AC

## 2020-07-27 MED ORDER — CHLORHEXIDINE GLUCONATE CLOTH 2 % EX PADS: 6.0000 | MEDICATED_PAD | Freq: Once | CUTANEOUS | Status: DC

## 2020-07-27 MED ORDER — MEPERIDINE HCL 25 MG/ML IJ SOLN: 6.2500 mg | INTRAMUSCULAR | Status: DC | PRN

## 2020-07-27 MED ORDER — CEFAZOLIN SODIUM-DEXTROSE 2-4 GM/100ML-% IV SOLN
2.0000 g | INTRAVENOUS | Status: AC
  Administered 2020-07-27: 2 g via INTRAVENOUS
  Filled 2020-07-27: qty 100

## 2020-07-27 MED ORDER — ROCURONIUM BROMIDE 10 MG/ML (PF) SYRINGE
PREFILLED_SYRINGE | INTRAVENOUS | Status: DC | PRN
  Administered 2020-07-27: 60 mg via INTRAVENOUS

## 2020-07-27 MED ORDER — FENTANYL CITRATE (PF) 250 MCG/5ML IJ SOLN
INTRAMUSCULAR | Status: AC
  Filled 2020-07-27: qty 5

## 2020-07-27 MED ORDER — HYDROCODONE-ACETAMINOPHEN 5-325 MG PO TABS: 1.0000 | ORAL_TABLET | Freq: Four times a day (QID) | ORAL | 0 refills | Status: DC | PRN

## 2020-07-27 MED ORDER — PROPOFOL 10 MG/ML IV BOLUS
INTRAVENOUS | Status: AC
  Filled 2020-07-27: qty 40

## 2020-07-27 MED ORDER — SUGAMMADEX SODIUM 200 MG/2ML IV SOLN
INTRAVENOUS | Status: DC | PRN
  Administered 2020-07-27: 200 mg via INTRAVENOUS

## 2020-07-27 MED ORDER — HYDROMORPHONE HCL 1 MG/ML IJ SOLN: 0.2500 mg | INTRAMUSCULAR | Status: DC | PRN

## 2020-07-27 MED ORDER — OXYCODONE HCL 5 MG PO TABS: 5.0000 mg | ORAL_TABLET | Freq: Once | ORAL | Status: DC | PRN

## 2020-07-27 MED ORDER — ONDANSETRON HCL 4 MG/2ML IJ SOLN
INTRAMUSCULAR | Status: DC | PRN
  Administered 2020-07-27: 4 mg via INTRAVENOUS

## 2020-07-27 MED ORDER — FENTANYL CITRATE (PF) 250 MCG/5ML IJ SOLN
INTRAMUSCULAR | Status: DC | PRN
  Administered 2020-07-27: 75 ug via INTRAVENOUS
  Administered 2020-07-27: 50 ug via INTRAVENOUS
  Administered 2020-07-27: 25 ug via INTRAVENOUS
  Administered 2020-07-27: 50 ug via INTRAVENOUS

## 2020-07-27 MED ORDER — MIDAZOLAM HCL 2 MG/2ML IJ SOLN
INTRAMUSCULAR | Status: AC
  Filled 2020-07-27: qty 2

## 2020-07-27 MED ORDER — AMISULPRIDE (ANTIEMETIC) 5 MG/2ML IV SOLN: 10.0000 mg | Freq: Once | INTRAVENOUS | Status: DC | PRN

## 2020-07-27 MED ORDER — LACTATED RINGERS IV SOLN: INTRAVENOUS | Status: DC

## 2020-07-27 MED ORDER — PROPOFOL 10 MG/ML IV BOLUS
INTRAVENOUS | Status: DC | PRN
  Administered 2020-07-27: 150 mg via INTRAVENOUS

## 2020-07-27 SURGICAL SUPPLY — 43 items
ADH SKN CLS APL DERMABOND .7 (GAUZE/BANDAGES/DRESSINGS) ×1
ADH SKN CLS LQ APL DERMABOND (GAUZE/BANDAGES/DRESSINGS) ×1
APL PRP STRL LF DISP 70% ISPRP (MISCELLANEOUS) ×1
BLADE CLIPPER SURG (BLADE) ×1 IMPLANT
CANISTER SUCT 3000ML PPV (MISCELLANEOUS) ×1 IMPLANT
CHLORAPREP W/TINT 26 (MISCELLANEOUS) ×2 IMPLANT
COVER SURGICAL LIGHT HANDLE (MISCELLANEOUS) ×2 IMPLANT
COVER WAND RF STERILE (DRAPES) ×2 IMPLANT
DERMABOND ADHESIVE PROPEN (GAUZE/BANDAGES/DRESSINGS) ×1
DERMABOND ADVANCED (GAUZE/BANDAGES/DRESSINGS) ×1
DERMABOND ADVANCED .7 DNX12 (GAUZE/BANDAGES/DRESSINGS) ×1 IMPLANT
DERMABOND ADVANCED .7 DNX6 (GAUZE/BANDAGES/DRESSINGS) IMPLANT
DRAIN PENROSE 1/2X12 LTX STRL (WOUND CARE) ×1 IMPLANT
DRAPE LAPAROTOMY TRNSV 102X78 (DRAPES) ×2 IMPLANT
ELECT REM PT RETURN 9FT ADLT (ELECTROSURGICAL) ×2
ELECTRODE REM PT RTRN 9FT ADLT (ELECTROSURGICAL) ×1 IMPLANT
GLOVE BIO SURGEON STRL SZ8 (GLOVE) ×2 IMPLANT
GLOVE SRG 8 PF TXTR STRL LF DI (GLOVE) ×1 IMPLANT
GLOVE SURG UNDER POLY LF SZ8 (GLOVE) ×2
GOWN STRL REUS W/ TWL LRG LVL3 (GOWN DISPOSABLE) ×1 IMPLANT
GOWN STRL REUS W/ TWL XL LVL3 (GOWN DISPOSABLE) ×1 IMPLANT
GOWN STRL REUS W/TWL LRG LVL3 (GOWN DISPOSABLE) ×2
GOWN STRL REUS W/TWL XL LVL3 (GOWN DISPOSABLE) ×2
KIT BASIN OR (CUSTOM PROCEDURE TRAY) ×2 IMPLANT
KIT TURNOVER KIT B (KITS) ×2 IMPLANT
MESH HERNIA SYS ULTRAPRO LRG (Mesh General) ×1 IMPLANT
NDL HYPO 25GX1X1/2 BEV (NEEDLE) ×1 IMPLANT
NEEDLE HYPO 25GX1X1/2 BEV (NEEDLE) ×2 IMPLANT
NS IRRIG 1000ML POUR BTL (IV SOLUTION) ×2 IMPLANT
PACK GENERAL/GYN (CUSTOM PROCEDURE TRAY) ×2 IMPLANT
PAD ARMBOARD 7.5X6 YLW CONV (MISCELLANEOUS) ×2 IMPLANT
PENCIL SMOKE EVACUATOR (MISCELLANEOUS) ×2 IMPLANT
SUT MNCRL AB 4-0 PS2 18 (SUTURE) ×2 IMPLANT
SUT NOVA NAB DX-16 0-1 5-0 T12 (SUTURE) ×3 IMPLANT
SUT VIC AB 0 CT1 27 (SUTURE) ×2
SUT VIC AB 0 CT1 27XBRD ANBCTR (SUTURE) IMPLANT
SUT VIC AB 2-0 SH 27 (SUTURE) ×4
SUT VIC AB 2-0 SH 27X BRD (SUTURE) ×1 IMPLANT
SUT VIC AB 3-0 SH 18 (SUTURE) ×2 IMPLANT
SUT VICRYL AB 3 0 TIES (SUTURE) ×2 IMPLANT
SYR CONTROL 10ML LL (SYRINGE) ×2 IMPLANT
TOWEL GREEN STERILE (TOWEL DISPOSABLE) ×2 IMPLANT
TOWEL GREEN STERILE FF (TOWEL DISPOSABLE) ×2 IMPLANT

## 2020-07-27 NOTE — Assessment & Plan Note (Signed)
Lab Results  Component Value Date   HGBA1C 5.6 07/20/2020   Stable, pt to continue current medical treatment  - diet and wt control

## 2020-07-27 NOTE — Interval H&P Note (Signed)
History and Physical Interval Note:  07/27/2020 8:19 AM  Paul Tucker  has presented today for surgery, with the diagnosis of RIGHT INGUINAL HERNIA.  The various methods of treatment have been discussed with the patient and family. After consideration of risks, benefits and other options for treatment, the patient has consented to  Procedure(s) with comments: RIGHT INGUINAL HERNIA REPAIR WITH MESH (Right) - ROOM 9 STARTING AT 08:30AM FOR 90 MIN as a surgical intervention.  The patient's history has been reviewed, patient examined, no change in status, stable for surgery.  I have reviewed the patient's chart and labs.  Questions were answered to the patient's satisfaction.  The risk of hernia repair include bleeding,  Infection,   Recurrence of the hernia,  Mesh use, chronic pain,  Organ injury,  Bowel injury,  Bladder injury,   nerve injury with numbness around the incision,  Death,  and worsening of preexisting  medical problems.  The alternatives to surgery have been discussed as well..  [place] term expectations of both operative and non operative treatments have been discussed.   The patient agrees to proceed.    [place]

## 2020-07-27 NOTE — Discharge Instructions (Signed)
[facility] _______[facility] ° °UMBILICAL OR INGUINAL HERNIA REPAIR: POST OP INSTRUCTIONS ° °Always review your discharge instruction sheet given to you by the facility where your surgery was performed. °IF YOU HAVE DISABILITY OR FAMILY LEAVE FORMS, YOU MUST BRING THEM TO THE OFFICE FOR PROCESSING.   °DO NOT GIVE THEM TO YOUR DOCTOR. ° °1. A  prescription for pain medication may be given to you upon discharge.  Take your pain medication as prescribed, if needed.  If narcotic pain medicine is not needed, then you may take acetaminophen (Tylenol) or ibuprofen (Advil) as needed. °2. Take your usually prescribed medications unless otherwise directed. °If you need a refill on your pain medication, please contact your pharmacy.  They will contact our office to request authorization. Prescriptions will not be filled after 5 pm or on week-ends. °3. You should follow a light diet the first 24 hours after arrival home, such as soup and crackers, etc.  Be sure to include lots of fluids daily.  Resume your normal diet the day after surgery. °4.Most patients will experience some swelling and bruising around the umbilicus or in the groin and scrotum.  Ice packs and reclining will help.  Swelling and bruising can take several days to resolve.  °6. It is common to experience some constipation if taking pain medication after surgery.  Increasing fluid intake and taking a stool softener (such as Colace) will usually help or prevent this problem from occurring.  A mild laxative (Milk of Magnesia or Miralax) should be taken according to package directions if there are no bowel movements after 48 hours. °7. Unless discharge instructions indicate otherwise, you may remove your bandages 24-48 hours after surgery, and you may shower at that time.  You may have steri-strips (small skin tapes) in place directly over the incision.  These strips should be left on the skin for 7-10 days.  If your surgeon used skin glue on the  incision, you may shower in 24 hours.  The glue will flake off over the next 2-3 weeks.  Any sutures or staples will be removed at the office during your follow-up visit. °8. ACTIVITIES:  You may resume regular (light) daily activities beginning the next day--such as daily self-care, walking, climbing stairs--gradually increasing activities as tolerated.  You may have sexual intercourse when it is comfortable.  Refrain from any heavy lifting or straining until approved by your doctor. ° °a.You may drive when you are no longer taking prescription pain medication, you can comfortably wear a seatbelt, and you can safely maneuver your car and apply brakes. °b.RETURN TO WORK:   °_____________________________________________ ° °9.You should see your doctor in the office for a follow-up appointment approximately 2-3 weeks after your surgery.  Make sure that you call for this appointment within a day or two after you arrive home to insure a convenient appointment time. °10.OTHER INSTRUCTIONS: _________________________ °   _____________________________________ ° °WHEN TO CALL YOUR DOCTOR: °1. Fever over 101.0 °2. Inability to urinate °3. Nausea and/or vomiting °4. Extreme swelling or bruising °5. Continued bleeding from incision. °6. Increased pain, redness, or drainage from the incision ° °The clinic staff is available to answer your questions during regular business hours.  Please don’t hesitate to call and ask to speak to one of the nurses for clinical concerns.  If you have a medical emergency, go to the nearest emergency room or call 911.  A surgeon from [facility] is always on call at the hospital ° ° °  [address], [address], [place], [place]  [address] ? ° [address] [address], [place], [place]   [address] °[phone] ? [phone] ? FAX [phone] °Web site: [email or web address] °

## 2020-07-27 NOTE — Assessment & Plan Note (Signed)
Lab Results  Component Value Date   LDLCALC 45 07/20/2020   Stable, pt to continue current statin lovastatin

## 2020-07-27 NOTE — Anesthesia Postprocedure Evaluation (Signed)
Anesthesia Post Note  Patient: Paul Tucker  Procedure(s) Performed: RIGHT INGUINAL HERNIA REPAIR WITH MESH (Right )     Patient location during evaluation: PACU Anesthesia Type: General Level of consciousness: sedated and patient cooperative Pain management: pain level controlled Vital Signs Assessment: post-procedure vital signs reviewed and stable Respiratory status: spontaneous breathing Cardiovascular status: stable Anesthetic complications: no   No complications documented.  Last Vitals:  Vitals:   07/27/20 1025 07/27/20 1040  BP: 115/76 114/69  Pulse: 71 69  Resp: 11 19  Temp:  (!) 36.3 C  SpO2: 95% 96%    Last Pain:  Vitals:   07/27/20 1010  TempSrc:   PainSc: [place]

## 2020-07-27 NOTE — Assessment & Plan Note (Signed)
With inreased symptoms pain, swelling recently  - for f/u general surgury

## 2020-07-27 NOTE — Assessment & Plan Note (Signed)
Stable, cont current med tx, f/u with psychiatry as planned - lithium, zyprexa, sunosi, trintelix   Current Outpatient Medications (Endocrine & Metabolic):  .  SYNTHROID 25 MCG tablet, Take 25 mcg by mouth daily before breakfast.  Current Outpatient Medications (Cardiovascular):  .  lovastatin (MEVACOR) 40 MG tablet, Take 1 tablet (40 mg total) by mouth at bedtime.  Current Outpatient Medications (Respiratory):  .  diphenhydrAMINE (BENADRYL) 25 MG tablet, Take 25 mg by mouth every 6 (six) hours as needed for allergies.  Current Outpatient Medications (Analgesics):  .  aspirin 81 MG EC tablet, Take 81 mg by mouth daily. Marland Kitchen  HYDROcodone-acetaminophen (NORCO/VICODIN) 5-325 MG tablet, Take 1 tablet by mouth every 6 (six) hours as needed for moderate pain. Marland Kitchen  ibuprofen (ADVIL) 800 MG tablet, Take 1 tablet (800 mg total) by mouth every 8 (eight) hours as needed.   Current Outpatient Medications (Other):  Marland Kitchen  Armodafinil 150 MG tablet, Take 150 mg by mouth daily. .  Ciclopirox 0.77 % gel, USE AS DIRECTED TOPCIALLY .  doxycycline (VIBRA-TABS) 100 MG tablet, Take 1 tablet (100 mg total) by mouth 2 (two) times daily. .  fluocinonide cream (LIDEX) 3.15 %, Apply 1 application topically 2 (two) times daily. Marland Kitchen  lithium carbonate (ESKALITH) 450 MG CR tablet, Take 450 mg by mouth at bedtime. .  meclizine (ANTIVERT) 25 MG tablet, TAKE ONE TABLET BY MOUTH THREE TIMES A DAY AS NEEDED FOR DIZZINESS .  mometasone (ELOCON) 0.1 % cream, Apply 1 application topically daily. Marland Kitchen  OLANZapine (ZYPREXA) 5 MG tablet, Take 5 mg by mouth at bedtime. .  Solriamfetol HCl (SUNOSI) 75 MG TABS,  .  tamsulosin (FLOMAX) 0.4 MG CAPS capsule, Take 1 capsule (0.4 mg total) by mouth daily. .  TRINTELLIX 20 MG TABS tablet, Take 20 mg by mouth daily. Marland Kitchen  zolpidem (AMBIEN) 10 MG tablet, TAKE ONE TABLET BY MOUTH EVERY NIGHT AT BEDTIME (Patient taking differently: Take 10 mg by mouth at bedtime.) .  LORazepam (ATIVAN) 0.5 MG tablet,  Take 0.5 mg by mouth 2 (two) times daily as needed.

## 2020-07-27 NOTE — Op Note (Signed)
Right inguinal Hernia repair with mesh , Open, Procedure Note  Indications: The patient presented with a history of a right, reducible  Inguinal hernia.   Discussed laparoscopic and open repair techniques with mesh.  Discussed mesh complications and the pros and cons of mesh use as well as the rationale for mesh use to reduce hopefully recurrences in the future.  After discussion of all surgical options and techniques, he opted for open repair of his right inguinal hernia.The risk of hernia repair include bleeding,  Infection,   Recurrence of the hernia,  Mesh use, chronic pain,  Organ injury,  Bowel injury,  Bladder injury,   nerve injury with numbness around the incision,  Death,  and worsening of preexisting  medical problems.  The alternatives to surgery have been discussed as well..  [place] term expectations of both operative and non operative treatments have been discussed.   The patient agrees to proceed.  Pre-operative Diagnosis: right reducible inguinal hernia  Post-operative Diagnosis: same  Surgeon: Turner Daniels MD  Assistants: None  Anesthesia: General endotracheal anesthesia, Local anesthesia 0.25.% bupivacaine, with epinephrine and TAP block on the right  ASA Class: 2  Procedure Details  The patient was seen again in the Holding Room. The risks, benefits, complications, treatment options, and expected outcomes were discussed with the patient. The possibilities of reaction to medication, pulmonary aspiration, perforation of viscus, bleeding, recurrent infection, the need for additional procedures, and development of a complication requiring transfusion or further operation were discussed with the patient and/or family. There was concurrence with the proposed plan, and informed consent was obtained. The site of surgery was properly noted/marked. The patient was taken to the Operating Room, identified as Paul Tucker, and the procedure verified as hernia repair. A Time Out was held and  the above information confirmed.  Right side was confirmed as correct site.  Regional block placed by anesthesia in the holding area.  The patient was placed in the supine position and underwent induction of anesthesia, the lower abdomen and groin was prepped and draped in the standard fashion, and 0.5% Marcaine with epinephrine was used to anesthetize the skin over the mid-portion of the inguinal canal. A transverse incision was made. Dissection was carried through the soft tissue to expose the inguinal canal and inguinal ligament along its lower edge. The external oblique fascia was split along the course of its fibers, exposing the inguinal canal.  He had significant scarring around the cord from his repair as a child.  The cord and nerve were looped using a Penrose drain and reflected out of the field.  Indirect defect noted and sac was dissected free from the cord.  The sac was opened and there is a small piece of preperitoneal and omental fat in it which was reduced back into the abdominal cavity.  There were no bowel contents of note.  The sac was closed with 2-0 Vicryl.  It was then reduced back into the preperitoneal space.  The defect was exposed and a piece of prolene hernia system ultrapro mesh was and placed into the defect and deployed.  The onlay was placed onto the floor of the inguinal canal.. Interupted 1-0 novafil suture was then used  to repair the defect, with the suture being sewn from the pubic tubercle inferiorly and superiorly along the canal to a level just beyond the internal ring. The mesh was split to allow passage of the cord  into the canal without entrapment.  The ilioinguinal nerve  was divided as it exited the muscle.  The contents were then returned to canal and the external oblique  was then closed in a continuous fashion using 2-0 Vicryl suture taking care not to cause entrapment. Scarpa's layer closed with 3 0 vicryl and 4 0 monocryl used to close the skin.  Dermabond used for  dressing.  Instrument, sponge, and needle counts were correct prior to closure and at the conclusion of the case.  Findings: Hernia as above  Estimated Blood Loss: Minimal         Drains: None         Total IV Fluids: Per record         Specimens: None               Complications: None; patient tolerated the procedure well.         Disposition: PACU - hemodynamically stable.         Condition: stable

## 2020-07-27 NOTE — Assessment & Plan Note (Signed)
Last vitamin D Lab Results  Component Value Date   VD25OH 40.37 07/12/2020   Stable, cont oral replacement

## 2020-07-27 NOTE — Anesthesia Procedure Notes (Signed)
Procedure Name: Intubation Date/Time: 07/27/2020 8:37 AM Performed by: Griffin Dakin, CRNA Pre-anesthesia Checklist: Patient identified, Emergency Drugs available, Suction available and Patient being monitored Patient Re-evaluated:Patient Re-evaluated prior to induction Oxygen Delivery Method: Circle system utilized Preoxygenation: Pre-oxygenation with 100% oxygen Induction Type: IV induction Ventilation: Mask ventilation without difficulty Laryngoscope Size: Mac and 4 Grade View: Grade I Tube type: Oral Tube size: 7.5 mm Number of attempts: 1 Airway Equipment and Method: Stylet and Oral airway Placement Confirmation: ETT inserted through vocal cords under direct vision,  positive ETCO2 and breath sounds checked- equal and bilateral Secured at: 22 cm Tube secured with: Tape Dental Injury: Teeth and Oropharynx as per pre-operative assessment

## 2020-07-27 NOTE — Transfer of Care (Signed)
Immediate Anesthesia Transfer of Care Note  Patient: XZAYVION VAETH  Procedure(s) Performed: RIGHT INGUINAL HERNIA REPAIR WITH MESH (Right )  Patient Location: PACU  Anesthesia Type:General  Level of Consciousness: awake, alert  and oriented  Airway & Oxygen Therapy: Patient Spontanous Breathing and Patient connected to face mask oxygen  Post-op Assessment: Report given to RN and Post -op Vital signs reviewed and stable  Post vital signs: Reviewed and stable  Last Vitals:  Vitals Value Taken Time  BP 118/74 07/27/20 1010  Temp    Pulse 71 07/27/20 1011  Resp 13 07/27/20 1011  SpO2 99 % 07/27/20 1011  Vitals shown include unvalidated device data.  Last Pain:  Vitals:   07/27/20 0645  TempSrc: Oral  PainSc: 0-No pain         Complications: No complications documented.

## 2020-07-27 NOTE — Assessment & Plan Note (Signed)
Age and sex appropriate education and counseling updated with regular exercise and diet Referrals for preventative services - none needed Immunizations addressed - for pneumovax today, declines Tdap Smoking counseling  - none needed Evidence for depression or other mood disorder - none significant Most recent labs reviewed. I have personally reviewed and have noted: 1) the patient's medical and social history 2) The patient's current medications and supplements 3) The patient's height, weight, and BMI have been recorded in the chart

## 2020-07-27 NOTE — Assessment & Plan Note (Signed)
Lab Results  Component Value Date   VITAMINB12 294 07/12/2020   Stable but low normal, to start oral replacement - b12 1000 mcg qd

## 2020-07-28 ENCOUNTER — Encounter (HOSPITAL_COMMUNITY): Payer: Self-pay | Admitting: Surgery

## 2020-08-10 DIAGNOSIS — F331 Major depressive disorder, recurrent, moderate: Secondary | ICD-10-CM | POA: Diagnosis not present

## 2020-08-10 DIAGNOSIS — F341 Dysthymic disorder: Secondary | ICD-10-CM | POA: Diagnosis not present

## 2020-08-27 ENCOUNTER — Encounter: Payer: Self-pay | Admitting: Internal Medicine

## 2020-09-05 ENCOUNTER — Other Ambulatory Visit: Payer: Self-pay | Admitting: Internal Medicine

## 2020-09-05 NOTE — Telephone Encounter (Signed)
Please refill as per office routine med refill policy (all routine meds refilled for 3 mo or monthly per pt preference up to one year from last visit, then month to month grace period for 3 mo, then further med refills will have to be denied)  

## 2020-09-07 DIAGNOSIS — R972 Elevated prostate specific antigen [PSA]: Secondary | ICD-10-CM | POA: Diagnosis not present

## 2020-09-11 ENCOUNTER — Other Ambulatory Visit: Payer: Self-pay | Admitting: Internal Medicine

## 2020-09-11 NOTE — Telephone Encounter (Signed)
Please refill as per office routine med refill policy (all routine meds refilled for 3 mo or monthly per pt preference up to one year from last visit, then month to month grace period for 3 mo, then further med refills will have to be denied)  

## 2020-09-14 DIAGNOSIS — R3912 Poor urinary stream: Secondary | ICD-10-CM | POA: Diagnosis not present

## 2020-09-14 DIAGNOSIS — N401 Enlarged prostate with lower urinary tract symptoms: Secondary | ICD-10-CM | POA: Diagnosis not present

## 2020-09-14 DIAGNOSIS — R972 Elevated prostate specific antigen [PSA]: Secondary | ICD-10-CM | POA: Diagnosis not present

## 2020-09-14 DIAGNOSIS — R102 Pelvic and perineal pain: Secondary | ICD-10-CM | POA: Diagnosis not present

## 2020-09-16 DIAGNOSIS — F332 Major depressive disorder, recurrent severe without psychotic features: Secondary | ICD-10-CM | POA: Diagnosis not present

## 2020-09-19 DIAGNOSIS — M5136 Other intervertebral disc degeneration, lumbar region: Secondary | ICD-10-CM | POA: Diagnosis not present

## 2020-09-19 DIAGNOSIS — M9903 Segmental and somatic dysfunction of lumbar region: Secondary | ICD-10-CM | POA: Diagnosis not present

## 2020-09-19 DIAGNOSIS — M5440 Lumbago with sciatica, unspecified side: Secondary | ICD-10-CM | POA: Diagnosis not present

## 2020-09-19 DIAGNOSIS — M9902 Segmental and somatic dysfunction of thoracic region: Secondary | ICD-10-CM | POA: Diagnosis not present

## 2020-09-21 DIAGNOSIS — F332 Major depressive disorder, recurrent severe without psychotic features: Secondary | ICD-10-CM | POA: Diagnosis not present

## 2020-09-21 DIAGNOSIS — M9903 Segmental and somatic dysfunction of lumbar region: Secondary | ICD-10-CM | POA: Diagnosis not present

## 2020-09-21 DIAGNOSIS — M5136 Other intervertebral disc degeneration, lumbar region: Secondary | ICD-10-CM | POA: Diagnosis not present

## 2020-09-21 DIAGNOSIS — M9902 Segmental and somatic dysfunction of thoracic region: Secondary | ICD-10-CM | POA: Diagnosis not present

## 2020-09-21 DIAGNOSIS — M5440 Lumbago with sciatica, unspecified side: Secondary | ICD-10-CM | POA: Diagnosis not present

## 2020-09-22 DIAGNOSIS — M9902 Segmental and somatic dysfunction of thoracic region: Secondary | ICD-10-CM | POA: Diagnosis not present

## 2020-09-22 DIAGNOSIS — M5440 Lumbago with sciatica, unspecified side: Secondary | ICD-10-CM | POA: Diagnosis not present

## 2020-09-22 DIAGNOSIS — M5136 Other intervertebral disc degeneration, lumbar region: Secondary | ICD-10-CM | POA: Diagnosis not present

## 2020-09-22 DIAGNOSIS — M9903 Segmental and somatic dysfunction of lumbar region: Secondary | ICD-10-CM | POA: Diagnosis not present

## 2020-09-26 DIAGNOSIS — M5440 Lumbago with sciatica, unspecified side: Secondary | ICD-10-CM | POA: Diagnosis not present

## 2020-09-26 DIAGNOSIS — M9902 Segmental and somatic dysfunction of thoracic region: Secondary | ICD-10-CM | POA: Diagnosis not present

## 2020-09-26 DIAGNOSIS — M5136 Other intervertebral disc degeneration, lumbar region: Secondary | ICD-10-CM | POA: Diagnosis not present

## 2020-09-26 DIAGNOSIS — M9903 Segmental and somatic dysfunction of lumbar region: Secondary | ICD-10-CM | POA: Diagnosis not present

## 2020-09-27 DIAGNOSIS — M5440 Lumbago with sciatica, unspecified side: Secondary | ICD-10-CM | POA: Diagnosis not present

## 2020-09-27 DIAGNOSIS — M9903 Segmental and somatic dysfunction of lumbar region: Secondary | ICD-10-CM | POA: Diagnosis not present

## 2020-09-27 DIAGNOSIS — M5136 Other intervertebral disc degeneration, lumbar region: Secondary | ICD-10-CM | POA: Diagnosis not present

## 2020-09-27 DIAGNOSIS — M9902 Segmental and somatic dysfunction of thoracic region: Secondary | ICD-10-CM | POA: Diagnosis not present

## 2020-09-28 DIAGNOSIS — M9902 Segmental and somatic dysfunction of thoracic region: Secondary | ICD-10-CM | POA: Diagnosis not present

## 2020-09-28 DIAGNOSIS — M5136 Other intervertebral disc degeneration, lumbar region: Secondary | ICD-10-CM | POA: Diagnosis not present

## 2020-09-28 DIAGNOSIS — M5440 Lumbago with sciatica, unspecified side: Secondary | ICD-10-CM | POA: Diagnosis not present

## 2020-09-28 DIAGNOSIS — M9903 Segmental and somatic dysfunction of lumbar region: Secondary | ICD-10-CM | POA: Diagnosis not present

## 2020-09-29 DIAGNOSIS — F332 Major depressive disorder, recurrent severe without psychotic features: Secondary | ICD-10-CM | POA: Diagnosis not present

## 2020-09-29 DIAGNOSIS — M9903 Segmental and somatic dysfunction of lumbar region: Secondary | ICD-10-CM | POA: Diagnosis not present

## 2020-09-29 DIAGNOSIS — M5136 Other intervertebral disc degeneration, lumbar region: Secondary | ICD-10-CM | POA: Diagnosis not present

## 2020-09-29 DIAGNOSIS — M9902 Segmental and somatic dysfunction of thoracic region: Secondary | ICD-10-CM | POA: Diagnosis not present

## 2020-09-29 DIAGNOSIS — M5440 Lumbago with sciatica, unspecified side: Secondary | ICD-10-CM | POA: Diagnosis not present

## 2020-10-06 DIAGNOSIS — F3341 Major depressive disorder, recurrent, in partial remission: Secondary | ICD-10-CM | POA: Diagnosis not present

## 2020-10-10 DIAGNOSIS — M9903 Segmental and somatic dysfunction of lumbar region: Secondary | ICD-10-CM | POA: Diagnosis not present

## 2020-10-10 DIAGNOSIS — M9902 Segmental and somatic dysfunction of thoracic region: Secondary | ICD-10-CM | POA: Diagnosis not present

## 2020-10-10 DIAGNOSIS — M5440 Lumbago with sciatica, unspecified side: Secondary | ICD-10-CM | POA: Diagnosis not present

## 2020-10-10 DIAGNOSIS — M5136 Other intervertebral disc degeneration, lumbar region: Secondary | ICD-10-CM | POA: Diagnosis not present

## 2020-10-12 DIAGNOSIS — M9902 Segmental and somatic dysfunction of thoracic region: Secondary | ICD-10-CM | POA: Diagnosis not present

## 2020-10-12 DIAGNOSIS — M5440 Lumbago with sciatica, unspecified side: Secondary | ICD-10-CM | POA: Diagnosis not present

## 2020-10-12 DIAGNOSIS — M9903 Segmental and somatic dysfunction of lumbar region: Secondary | ICD-10-CM | POA: Diagnosis not present

## 2020-10-12 DIAGNOSIS — M5136 Other intervertebral disc degeneration, lumbar region: Secondary | ICD-10-CM | POA: Diagnosis not present

## 2020-10-14 DIAGNOSIS — F332 Major depressive disorder, recurrent severe without psychotic features: Secondary | ICD-10-CM | POA: Diagnosis not present

## 2020-10-18 DIAGNOSIS — I10 Essential (primary) hypertension: Secondary | ICD-10-CM | POA: Diagnosis not present

## 2020-10-18 DIAGNOSIS — E119 Type 2 diabetes mellitus without complications: Secondary | ICD-10-CM | POA: Diagnosis not present

## 2020-10-18 DIAGNOSIS — M9902 Segmental and somatic dysfunction of thoracic region: Secondary | ICD-10-CM | POA: Diagnosis not present

## 2020-10-18 DIAGNOSIS — M5136 Other intervertebral disc degeneration, lumbar region: Secondary | ICD-10-CM | POA: Diagnosis not present

## 2020-10-18 DIAGNOSIS — D3132 Benign neoplasm of left choroid: Secondary | ICD-10-CM | POA: Diagnosis not present

## 2020-10-18 DIAGNOSIS — M9903 Segmental and somatic dysfunction of lumbar region: Secondary | ICD-10-CM | POA: Diagnosis not present

## 2020-10-18 DIAGNOSIS — H35033 Hypertensive retinopathy, bilateral: Secondary | ICD-10-CM | POA: Diagnosis not present

## 2020-10-18 DIAGNOSIS — M5440 Lumbago with sciatica, unspecified side: Secondary | ICD-10-CM | POA: Diagnosis not present

## 2020-11-04 DIAGNOSIS — F332 Major depressive disorder, recurrent severe without psychotic features: Secondary | ICD-10-CM | POA: Diagnosis not present

## 2020-11-09 ENCOUNTER — Other Ambulatory Visit: Payer: Self-pay | Admitting: Internal Medicine

## 2020-11-28 DIAGNOSIS — M9902 Segmental and somatic dysfunction of thoracic region: Secondary | ICD-10-CM | POA: Diagnosis not present

## 2020-11-28 DIAGNOSIS — M5440 Lumbago with sciatica, unspecified side: Secondary | ICD-10-CM | POA: Diagnosis not present

## 2020-11-28 DIAGNOSIS — M5136 Other intervertebral disc degeneration, lumbar region: Secondary | ICD-10-CM | POA: Diagnosis not present

## 2020-11-28 DIAGNOSIS — M9903 Segmental and somatic dysfunction of lumbar region: Secondary | ICD-10-CM | POA: Diagnosis not present

## 2020-11-29 DIAGNOSIS — F332 Major depressive disorder, recurrent severe without psychotic features: Secondary | ICD-10-CM | POA: Diagnosis not present

## 2020-12-02 ENCOUNTER — Other Ambulatory Visit: Payer: Self-pay

## 2020-12-02 MED ORDER — TAMSULOSIN HCL 0.4 MG PO CAPS: 0.4000 mg | ORAL_CAPSULE | Freq: Every day | ORAL | 0 refills | Status: DC

## 2020-12-09 DIAGNOSIS — F332 Major depressive disorder, recurrent severe without psychotic features: Secondary | ICD-10-CM | POA: Diagnosis not present

## 2020-12-15 DIAGNOSIS — F332 Major depressive disorder, recurrent severe without psychotic features: Secondary | ICD-10-CM | POA: Diagnosis not present

## 2020-12-16 DIAGNOSIS — F332 Major depressive disorder, recurrent severe without psychotic features: Secondary | ICD-10-CM | POA: Diagnosis not present

## 2020-12-30 DIAGNOSIS — F332 Major depressive disorder, recurrent severe without psychotic features: Secondary | ICD-10-CM | POA: Diagnosis not present

## 2021-01-06 DIAGNOSIS — F3342 Major depressive disorder, recurrent, in full remission: Secondary | ICD-10-CM | POA: Diagnosis not present

## 2021-01-13 DIAGNOSIS — F332 Major depressive disorder, recurrent severe without psychotic features: Secondary | ICD-10-CM | POA: Diagnosis not present

## 2021-01-19 ENCOUNTER — Other Ambulatory Visit (INDEPENDENT_AMBULATORY_CARE_PROVIDER_SITE_OTHER): Payer: PPO

## 2021-01-19 ENCOUNTER — Other Ambulatory Visit: Payer: Self-pay

## 2021-01-19 DIAGNOSIS — E538 Deficiency of other specified B group vitamins: Secondary | ICD-10-CM | POA: Diagnosis not present

## 2021-01-19 DIAGNOSIS — R972 Elevated prostate specific antigen [PSA]: Secondary | ICD-10-CM | POA: Diagnosis not present

## 2021-01-19 DIAGNOSIS — E1340 Other specified diabetes mellitus with diabetic neuropathy, unspecified: Secondary | ICD-10-CM | POA: Diagnosis not present

## 2021-01-19 DIAGNOSIS — E559 Vitamin D deficiency, unspecified: Secondary | ICD-10-CM

## 2021-01-19 LAB — HEMOGLOBIN A1C: Hgb A1c MFr Bld: 5.7 % (ref 4.6–6.5)

## 2021-01-19 LAB — HEPATIC FUNCTION PANEL
ALT: 15 U/L (ref 0–53)
AST: 14 U/L (ref 0–37)
Albumin: 4.3 g/dL (ref 3.5–5.2)
Alkaline Phosphatase: 66 U/L (ref 39–117)
Bilirubin, Direct: 0.2 mg/dL (ref 0.0–0.3)
Total Bilirubin: 1 mg/dL (ref 0.2–1.2)
Total Protein: 7 g/dL (ref 6.0–8.3)

## 2021-01-19 LAB — BASIC METABOLIC PANEL
BUN: 20 mg/dL (ref 6–23)
CO2: 26 mEq/L (ref 19–32)
Calcium: 9.6 mg/dL (ref 8.4–10.5)
Chloride: 105 mEq/L (ref 96–112)
Creatinine, Ser: 1.04 mg/dL (ref 0.40–1.50)
GFR: 73.35 mL/min (ref >60.00)
Glucose, Bld: 144 mg/dL — ABNORMAL HIGH (ref 70–99)
Potassium: 4 mEq/L (ref 3.5–5.1)
Sodium: 140 mEq/L (ref 135–145)

## 2021-01-19 LAB — PSA: PSA: 4.3 ng/mL — ABNORMAL HIGH (ref 0.10–4.00)

## 2021-01-19 LAB — LIPID PANEL
Cholesterol: 123 mg/dL (ref 0–200)
HDL: 49 mg/dL (ref >39.00)
LDL Cholesterol: 58 mg/dL (ref 0–99)
NonHDL: 74.37
Total CHOL/HDL Ratio: 3
Triglycerides: 80 mg/dL (ref 0.0–149.0)
VLDL: 16 mg/dL (ref 0.0–40.0)

## 2021-01-19 LAB — VITAMIN B12: Vitamin B-12: 233 pg/mL (ref 211–911)

## 2021-01-19 LAB — VITAMIN D 25 HYDROXY (VIT D DEFICIENCY, FRACTURES): VITD: 38.17 ng/mL (ref 30.00–100.00)

## 2021-01-25 ENCOUNTER — Other Ambulatory Visit: Payer: Self-pay

## 2021-01-25 ENCOUNTER — Ambulatory Visit (INDEPENDENT_AMBULATORY_CARE_PROVIDER_SITE_OTHER): Payer: PPO | Admitting: Internal Medicine

## 2021-01-25 ENCOUNTER — Encounter: Payer: Self-pay | Admitting: Internal Medicine

## 2021-01-25 VITALS — BP 120/62 | HR 65 | Temp 99.1°F | Ht 73.0 in | Wt 204.0 lb

## 2021-01-25 DIAGNOSIS — E559 Vitamin D deficiency, unspecified: Secondary | ICD-10-CM | POA: Diagnosis not present

## 2021-01-25 DIAGNOSIS — E7800 Pure hypercholesterolemia, unspecified: Secondary | ICD-10-CM | POA: Diagnosis not present

## 2021-01-25 DIAGNOSIS — R972 Elevated prostate specific antigen [PSA]: Secondary | ICD-10-CM

## 2021-01-25 DIAGNOSIS — E1340 Other specified diabetes mellitus with diabetic neuropathy, unspecified: Secondary | ICD-10-CM

## 2021-01-25 DIAGNOSIS — E538 Deficiency of other specified B group vitamins: Secondary | ICD-10-CM | POA: Diagnosis not present

## 2021-01-25 NOTE — Patient Instructions (Signed)
Please take the B12 1000 mcg per day for 6 mo  Please take OTC Vitamin D3 at 2000 units per day, indefinitely  We have discussed the Cardiac CT Score test to measure the calcification level (if any) in your heart arteries.  This test has been ordered in our [place], so please call [facility] directly, as they prefer this, at [phone] to be scheduled.  Please continue all other medications as before, and refills have been done if requested.  Please have the pharmacy call with any other refills you may need.  Please continue your efforts at being more active, low cholesterol diet, and weight control  Please keep your appointments with your specialists as you may have planned  Please make an Appointment to return in 6 months, or sooner if needed, also with Lab Appointment for testing done 3-5 days before at the [facility] (so this is for TWO appointments - please see the scheduling desk as you leave)  Due to the ongoing Covid 19 pandemic, our lab now requires an appointment for any labs done at our office.  If you need labs done and do not have an appointment, please call our office ahead of time to schedule before presenting to the lab for your testing.

## 2021-01-25 NOTE — Progress Notes (Signed)
Patient ID: Paul Tucker, male   DOB: March 20, 1951, 70 y.o.   MRN: [id number]        Chief Complaint: follow up HTN, HLD and hyperglycemia , low b12 and D       HPI:  Paul Tucker is a 70 y.o. male here overall doing well, Pt denies chest pain, increased sob or doe, wheezing, orthopnea, PND, increased LE swelling, palpitations, dizziness or syncope.   Pt denies polydipsia, polyuria, or new focal neuro s/s.  Taking vit d 2000 u qd.  Not taking b12.   Pt denies polydipsia, polyuria, or new focal neuro s/s.   Pt denies fever, wt loss, night sweats, loss of appetite, or other constitutional symptoms  Denies urinary symptoms such as dysuria, frequency, urgency, flank pain, hematuria or n/v, fever, chills.         Wt Readings from Last 3 Encounters:  01/25/21 204 lb (92.5 kg)  07/27/20 200 lb (90.7 kg)  07/20/20 202 lb (91.6 kg)   BP Readings from Last 3 Encounters:  01/25/21 120/62  07/27/20 114/69  07/20/20 120/72         Past Medical History:  Diagnosis Date   Acute prostatitis 04/04/2007   ADD 01/17/2007   Anxiety    DEPRESSION 01/17/2007   Treatment Resident Depression   Dermatophytosis of groin and perianal area 05/31/2008   DIVERTICULOSIS, COLON 04/06/2007   Dysuria 01/27/2009   FLANK PAIN, LEFT 09/30/2007   Flushing 08/26/2009   GLUCOSE INTOLERANCE 04/04/2007   Gross hematuria 01/27/2009   HEMATOCHEZIA 07/26/2008   HYPERLIPIDEMIA 01/20/2007   HYPOGONADISM 06/13/2009   Impaired glucose tolerance 12/10/2010   INSOMNIA 04/04/2007   LIBIDO, DECREASED [id number]   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   LOW BACK PAIN 01/20/2007   OBSTRUCTIVE SLEEP APNEA 04/04/2007   OTITIS MEDIA, ACUTE, BILATERAL 06/13/2009   PANCREATITIS, HX OF 01/17/2007   PSA, INCREASED 11/18/2009   RASH-NONVESICULAR 08/26/2009   SINUSITIS- ACUTE-NOS 04/04/2007   SWEATING 09/30/2007   THRUSH 04/04/2007   TONSILLECTOMY AND ADENOIDECTOMY, HX OF 01/17/2007   URI 07/25/2010   URINARY RETENTION 01/27/2009   VERTIGO 06/13/2009   Past  Surgical History:  Procedure Laterality Date   CHOLECYSTECTOMY  2005   HEMORRHOID BANDING  2015   HERNIA REPAIR  1957   INGUINAL HERNIA REPAIR Right 07/27/2020   Procedure: RIGHT INGUINAL HERNIA REPAIR WITH MESH;  Surgeon: Erroll Luna, MD;  Location: [facility];  Service: General;  Laterality: Right;   [place]    reports that he has never smoked. He has never used smokeless tobacco. He reports that he does not drink alcohol and does not use drugs. family history includes Coronary artery disease in an other family member; Diabetes in an other family member; Emphysema in his father; Heart disease in his father. Allergies  Allergen Reactions   Atrovent [Ipratropium] Swelling    Throat swelling   Pravastatin Sodium Nausea Only    Pt does not recall this reaction   Current Outpatient Medications on File Prior to Visit  Medication Sig Dispense Refill   aspirin 81 MG EC tablet Take 81 mg by mouth daily.     Ciclopirox 0.77 % gel USE AS DIRECTED TOPCIALLY 45 g 1   diphenhydrAMINE (BENADRYL) 25 MG tablet Take 25 mg by mouth every 6 (six) hours as needed for allergies.     fluocinonide cream (LIDEX) [id number] % Apply 1 application topically 2 (two) times daily. 30 g 0   ibuprofen (  ADVIL) 800 MG tablet Take 1 tablet (800 mg total) by mouth every 8 (eight) hours as needed. 30 tablet 0   LORazepam (ATIVAN) 0.5 MG tablet Take 0.5 mg by mouth 2 (two) times daily as needed.     lovastatin (MEVACOR) 40 MG tablet TAKE ONE TABLET BY MOUTH AT BEDTIME 90 tablet 3   meclizine (ANTIVERT) 25 MG tablet TAKE ONE TABLET BY MOUTH THREE TIMES A DAY AS NEEDED FOR DIZZINESS 30 tablet 2   modafinil (PROVIGIL) 200 MG tablet Take 200 mg by mouth daily.     mometasone (ELOCON) 0.1 % cream Apply 1 application topically daily. 45 g 0   OLANZapine (ZYPREXA) 5 MG tablet Take 5 mg by mouth at bedtime.     tamsulosin (FLOMAX) 0.4 MG CAPS capsule Take 1 capsule (0.4 mg total) by mouth daily. 90 capsule 0    TRINTELLIX 20 MG TABS tablet Take 20 mg by mouth daily.     zolpidem (AMBIEN) 10 MG tablet TAKE ONE TABLET BY MOUTH EVERY NIGHT AT BEDTIME 90 tablet 1   No current facility-administered medications on file prior to visit.        ROS:  All others reviewed and negative.  Objective        PE:  BP 120/62 (BP Location: Right Arm, Patient Position: Sitting, Cuff Size: Large)   Pulse 65   Temp 99.1 F (37.3 C) (Oral)   Ht '6\' 1"'$  (1.854 m)   Wt 204 lb (92.5 kg)   SpO2 99%   BMI 26.91 kg/m                 Constitutional: Pt appears in NAD               HENT: Head: NCAT.                Right Ear: External ear normal.                 Left Ear: External ear normal.                Eyes: . Pupils are equal, round, and reactive to light. Conjunctivae and EOM are normal               Nose: without d/c or deformity               Neck: Neck supple. Gross normal ROM               Cardiovascular: Normal rate and regular rhythm.                 Pulmonary/Chest: Effort normal and breath sounds without rales or wheezing.                Abd:  Soft, NT, ND, + BS, no organomegaly               Neurological: Pt is alert. At baseline orientation, motor grossly intact               Skin: Skin is warm. No rashes, no other new lesions, LE edema - none               Psychiatric: Pt behavior is normal without agitation   Micro: none  Cardiac tracings I have personally interpreted today:  none  Pertinent Radiological findings (summarize): none   Lab Results  Component Value Date   WBC 5.7 07/27/2020   HGB 14.6 07/27/2020   HCT 41.6 07/27/2020  PLT 212 07/27/2020   GLUCOSE 144 (H) 01/19/2021   CHOL 123 01/19/2021   TRIG 80.0 01/19/2021   HDL 49.00 01/19/2021   LDLDIRECT 82.0 02/28/2017   LDLCALC 58 01/19/2021   ALT 15 01/19/2021   AST 14 01/19/2021   NA 140 01/19/2021   K 4.0 01/19/2021   CL 105 01/19/2021   CREATININE 1.04 01/19/2021   BUN 20 01/19/2021   CO2 26 01/19/2021   TSH 1.30  07/12/2020   PSA 4.30 (H) 01/19/2021   HGBA1C 5.7 01/19/2021   MICROALBUR 1.9 07/20/2020   Assessment/Plan:  Paul Tucker is a 70 y.o. White or Caucasian [1] male with  has a past medical history of Acute prostatitis (04/04/2007), ADD (01/17/2007), Anxiety, DEPRESSION (01/17/2007), Dermatophytosis of groin and perianal area (05/31/2008), DIVERTICULOSIS, COLON (04/06/2007), Dysuria (01/27/2009), FLANK PAIN, LEFT (09/30/2007), Flushing (08/26/2009), GLUCOSE INTOLERANCE (04/04/2007), Gross hematuria (01/27/2009), HEMATOCHEZIA (07/26/2008), HYPERLIPIDEMIA (01/20/2007), HYPOGONADISM (06/13/2009), Impaired glucose tolerance (12/10/2010), INSOMNIA (04/04/2007), LIBIDO, DECREASED (A999333), LICHEN SIMPLEX CHRONICUS (05/31/2008), LOW BACK PAIN (01/20/2007), OBSTRUCTIVE SLEEP APNEA (04/04/2007), OTITIS MEDIA, ACUTE, BILATERAL (06/13/2009), PANCREATITIS, HX OF (01/17/2007), PSA, INCREASED (11/18/2009), RASH-NONVESICULAR (08/26/2009), SINUSITIS- ACUTE-NOS (04/04/2007), SWEATING (09/30/2007), THRUSH (04/04/2007), TONSILLECTOMY AND ADENOIDECTOMY, HX OF (01/17/2007), URI (07/25/2010), URINARY RETENTION (01/27/2009), and VERTIGO (06/13/2009).  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  Cathlean Cower, MD 01/29/2021 1:14 PM [facility]

## 2021-01-29 ENCOUNTER — Encounter: Payer: Self-pay | Admitting: Internal Medicine

## 2021-01-29 NOTE — Assessment & Plan Note (Signed)
Lab Results  Component Value Date   VITAMINB12 233 01/19/2021   Low normal, to start oral replacement - b12 1000 mcg qd

## 2021-01-29 NOTE — Assessment & Plan Note (Signed)
Last vitamin D Lab Results  Component Value Date   VD25OH 38.17 01/19/2021   Low normal, to increase oral replacement to 4000 u qd Vit D3

## 2021-01-29 NOTE — Assessment & Plan Note (Signed)
Asympt, for f/u psa, and f/u urology as planned

## 2021-01-29 NOTE — Assessment & Plan Note (Signed)
Lab Results  Component Value Date   HGBA1C 5.7 01/19/2021   Stable, pt to continue current medical treatment  - diet

## 2021-01-29 NOTE — Assessment & Plan Note (Addendum)
Lab Results  Component Value Date   LDLCALC 58 01/19/2021   Stable, pt to continue current statin lovastatin 40, also for  Cardiac ct score

## 2021-02-03 ENCOUNTER — Ambulatory Visit (INDEPENDENT_AMBULATORY_CARE_PROVIDER_SITE_OTHER): Payer: PPO | Admitting: Nurse Practitioner

## 2021-02-03 ENCOUNTER — Encounter: Payer: Self-pay | Admitting: Nurse Practitioner

## 2021-02-03 ENCOUNTER — Other Ambulatory Visit: Payer: Self-pay

## 2021-02-03 VITALS — BP 142/84 | HR 92 | Ht 73.0 in | Wt 203.0 lb

## 2021-02-03 DIAGNOSIS — R42 Dizziness and giddiness: Secondary | ICD-10-CM | POA: Diagnosis not present

## 2021-02-03 MED ORDER — PREDNISONE 20 MG PO TABS: 40.0000 mg | ORAL_TABLET | Freq: Every day | ORAL | 0 refills | Status: DC

## 2021-02-03 NOTE — Progress Notes (Addendum)
Subjective:  Patient ID: Paul Tucker, male    DOB: Nov 23, 1950  Age: 70 y.o. MRN: [id number]  CC:  Chief Complaint  Patient presents with   Dizziness    X 2days      HPI  This patient arrives today for the above.  He reports having dizziness for the last 2 to 3 days.  He does have a history of vertigo, and feels that his symptoms are similar.  He wanted to be seen today because his vertigo does not usually last more than a day or so.  He does have Antivert which he has been taking as needed which seemed to help his symptoms.  Symptoms are triggered by positional changes such as moving too fast or bending over.  Symptoms only last for a few moments and then spontaneously resolved. He feels he may have fluid in his left ear, but does not report significant changes to hearing.   Past Medical History:  Diagnosis Date   Acute prostatitis 04/04/2007   ADD 01/17/2007   Anxiety    DEPRESSION 01/17/2007   Treatment Resident Depression   Dermatophytosis of groin and perianal area 05/31/2008   DIVERTICULOSIS, COLON 04/06/2007   Dysuria 01/27/2009   FLANK PAIN, LEFT 09/30/2007   Flushing 08/26/2009   GLUCOSE INTOLERANCE 04/04/2007   Gross hematuria 01/27/2009   HEMATOCHEZIA 07/26/2008   HYPERLIPIDEMIA 01/20/2007   HYPOGONADISM 06/13/2009   Impaired glucose tolerance 12/10/2010   INSOMNIA 04/04/2007   LIBIDO, DECREASED [id number]   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   LOW BACK PAIN 01/20/2007   OBSTRUCTIVE SLEEP APNEA 04/04/2007   OTITIS MEDIA, ACUTE, BILATERAL 06/13/2009   PANCREATITIS, HX OF 01/17/2007   PSA, INCREASED 11/18/2009   RASH-NONVESICULAR 08/26/2009   SINUSITIS- ACUTE-NOS 04/04/2007   SWEATING 09/30/2007   THRUSH 04/04/2007   TONSILLECTOMY AND ADENOIDECTOMY, HX OF 01/17/2007   URI 07/25/2010   URINARY RETENTION 01/27/2009   VERTIGO 06/13/2009      Family History  Problem Relation Age of Onset   Emphysema Father    Heart disease Father    Coronary artery disease Other        male,  1st degree relative   Diabetes Other        1st degree relative   Colon cancer Neg Hx    Esophageal cancer Neg Hx    Rectal cancer Neg Hx    Stomach cancer Neg Hx     Social History   Social History Narrative   Not on file   Social History   Tobacco Use   Smoking status: Never   Smokeless tobacco: Never  Substance Use Topics   Alcohol use: Never    Alcohol/week: 0.0 standard drinks     Current Meds  Medication Sig   aspirin 81 MG EC tablet Take 81 mg by mouth daily.   Ciclopirox 0.77 % gel USE AS DIRECTED TOPCIALLY   diphenhydrAMINE (BENADRYL) 25 MG tablet Take 25 mg by mouth every 6 (six) hours as needed for allergies.   fluocinonide cream (LIDEX) [id number] % Apply 1 application topically 2 (two) times daily.   ibuprofen (ADVIL) 800 MG tablet Take 1 tablet (800 mg total) by mouth every 8 (eight) hours as needed.   LORazepam (ATIVAN) 0.5 MG tablet Take 0.5 mg by mouth 2 (two) times daily as needed.   lovastatin (MEVACOR) 40 MG tablet TAKE ONE TABLET BY MOUTH AT BEDTIME   meclizine (ANTIVERT) 25 MG tablet TAKE ONE TABLET BY MOUTH THREE TIMES A  DAY AS NEEDED FOR DIZZINESS   modafinil (PROVIGIL) 200 MG tablet Take 200 mg by mouth daily.   mometasone (ELOCON) 0.1 % cream Apply 1 application topically daily.   OLANZapine (ZYPREXA) 5 MG tablet Take 5 mg by mouth at bedtime.   predniSONE (DELTASONE) 20 MG tablet Take 2 tablets (40 mg total) by mouth daily with breakfast.   tamsulosin (FLOMAX) 0.4 MG CAPS capsule Take 1 capsule (0.4 mg total) by mouth daily.   TRINTELLIX 20 MG TABS tablet Take 20 mg by mouth daily.   zolpidem (AMBIEN) 10 MG tablet TAKE ONE TABLET BY MOUTH EVERY NIGHT AT BEDTIME    ROS:  Review of Systems  Constitutional:  Negative for fever.  HENT:  Positive for ear pain. Negative for congestion, ear discharge, hearing loss, sinus pain and tinnitus.   Eyes:  Positive for blurred vision. Negative for double vision.  Neurological:  Positive for dizziness. Negative  for headaches.       (-) gait disturbances, (-) dysarthria, (-) weakness, (-) sensory changes    Objective:   Today's Vitals: BP (!) 142/84   Pulse 92   Ht '6\' 1"'$  (1.854 m)   Wt 203 lb (92.1 kg)   SpO2 96%   BMI 26.78 kg/m  Vitals with BMI 02/03/2021 01/25/2021 07/27/2020  Height '6\' 1"'$  '6\' 1"'$  -  Weight 203 lbs 204 lbs -  BMI [id number] -  Systolic [id number]  Diastolic 84 62 69  Pulse 92 65 69     Physical Exam Vitals reviewed.  Constitutional:      Appearance: Normal appearance.  HENT:     Head: Normocephalic and atraumatic.     Comments: (-) head impulse test    Right Ear: Hearing, ear canal and external ear normal. No drainage, swelling or tenderness. There is no impacted cerumen. No foreign body. Tympanic membrane is not injected, scarred, perforated or erythematous.     Left Ear: Hearing, ear canal and external ear normal. Swelling present. No drainage or tenderness. A middle ear effusion is present. There is no impacted cerumen. No foreign body. Tympanic membrane is not injected, scarred, perforated or erythematous.  Cardiovascular:     Rate and Rhythm: Normal rate and regular rhythm.  Pulmonary:     Effort: Pulmonary effort is normal.     Breath sounds: Normal breath sounds.  Musculoskeletal:     Cervical back: Neck supple.  Skin:    General: Skin is warm and dry.  Neurological:     Mental Status: He is alert and oriented to person, place, and time.     Cranial Nerves: Cranial nerves are intact.     Motor: Motor function is intact.     Coordination: Coordination is intact.     Gait: Tandem walk abnormal.  Psychiatric:        Mood and Affect: Mood normal.        Behavior: Behavior normal.        Thought Content: Thought content normal.        Judgment: Judgment normal.     Orthostatic BP: Sitting: 142/80 Standing: 130/84    Assessment and Plan   1. Vertigo      Plan: Patient had bilateral impacted cerumen, this was cleared with flushing ears by  medical assistant today.  Some fluid noted behind the left eardrum.  Symptoms and exam reassuring, most likely he is experiencing BPPV.  He was encouraged to continue taking his Antivert as needed and will prescribe short  dose of prednisone.  He was encouraged to call the office next week if symptoms persist. I did discuss this case with supervising physician Dr. Quay Burow who agrees with the above plan.    Tests ordered No orders of the defined types were placed in this encounter.     Meds ordered this encounter  Medications   predniSONE (DELTASONE) 20 MG tablet    Sig: Take 2 tablets (40 mg total) by mouth daily with breakfast.    Dispense:  10 tablet    Refill:  0    Order Specific Question:   Supervising Provider    Answer:   Binnie Rail [id number]    Patient to follow-up as scheduled with Dr. Jenny Reichmann or sooner as needed  Ailene Ards, NP

## 2021-02-03 NOTE — Patient Instructions (Signed)
Prednisone Tablets What is this medication? PREDNISONE (PRED ni sone) treats many conditions such as asthma, allergic reactions, arthritis, inflammatory bowel diseases, adrenal, and blood or bone marrow disorders. It works by decreasing inflammation, slowing down an overactive immune system, or replacing cortisol normally made in the body. Cortisol is a hormone that plays an important role in how the body responds to stress, illness, and injury. It belongs to a group of medications called steroids. This medicine may be used for other purposes; ask your health care provider or pharmacist if you have questions. COMMON BRAND NAME(S): Deltasone, Predone, Sterapred, Sterapred DS What should I tell my care team before I take this medication? They need to know if you have any of these conditions: Cushing's syndrome Diabetes Glaucoma Heart disease High blood pressure Infection (especially a virus infection such as chickenpox, cold sores, or herpes) Kidney disease Liver disease Mental illness Myasthenia gravis Osteoporosis Seizures Stomach or intestine problems Thyroid disease An unusual or allergic reaction to lactose, prednisone, other medications, foods, dyes, or preservatives Pregnant or trying to get pregnant Breast-feeding How should I use this medication? Take this medication by mouth with a glass of water. Follow the directions on the prescription label. Take this medication with food. If you are taking this medication once a day, take it in the morning. Do not take more medication than you are told to take. Do not suddenly stop taking your medication because you may develop a severe reaction. Your care team will tell you how much medication to take. If your care team wants you to stop the medication, the dose may be slowly lowered over time to avoid any side effects. Talk to your care team about the use of this medication in children. Special care may be needed. Overdosage: If you think  you have taken too much of this medicine contact a poison control center or emergency room at once. NOTE: This medicine is only for you. Do not share this medicine with others. What if I miss a dose? If you miss a dose, take it as soon as you can. If it is almost time for your next dose, talk to your care team. You may need to miss a dose or take an extra dose. Do not take double or extra doses without advice. What may interact with this medication? Do not take this medication with any of the following: Metyrapone Mifepristone This medication may also interact with the following: Aminoglutethimide Amphotericin B Aspirin and aspirin-like medications Barbiturates Certain medications for diabetes, like glipizide or glyburide Cholestyramine Cholinesterase inhibitors Cyclosporine Digoxin Diuretics Ephedrine Male hormones, like estrogens and birth control pills Isoniazid Ketoconazole NSAIDS, medications for pain and inflammation, like ibuprofen or naproxen Phenytoin Rifampin Toxoids Vaccines Warfarin This list may not describe all possible interactions. Give your health care provider a list of all the medicines, herbs, non-prescription drugs, or dietary supplements you use. Also tell them if you smoke, drink alcohol, or use illegal drugs. Some items may interact with your medicine. What should I watch for while using this medication? Visit your care team for regular checks on your progress. If you are taking this medication over a prolonged period, carry an identification card with your name and address, the type and dose of your medication, and your care team's name and address. This medication may increase your risk of getting an infection. Tell your care team if you are around anyone with measles or chickenpox, or if you develop sores or blisters that do not heal properly.   If you are going to have surgery, tell your care team that you have taken this medication within the last twelve  months. Ask your care team about your diet. You may need to lower the amount of salt you eat. This medication may increase blood sugar. Ask your care team if changes in diet or medications are needed if you have diabetes. What side effects may I notice from receiving this medication? Side effects that you should report to your care team as soon as possible: Allergic reactions-skin rash, itching, hives, swelling of the face, lips, tongue, or throat Cushing syndrome-increased fat around the midsection, upper back, neck, or face, pink or purple stretch marks on the skin, thinning, fragile skin that easily bruises, unexpected hair growth High blood sugar (hyperglycemia)-increased thirst or amount of urine, unusual weakness or fatigue, blurry vision Increase in blood pressure Infection-fever, chills, cough, sore throat, wounds that don't heal, pain or trouble when passing urine, general feeling of discomfort or being unwell Low adrenal gland function-nausea, vomiting, loss of appetite, unusual weakness or fatigue, dizziness Mood and behavior changes-anxiety, nervousness, confusion, hallucinations, irritability, hostility, thoughts of suicide or self-harm, worsening mood, feelings of depression Stomach bleeding-bloody or black, tar-like stools, vomiting blood or brown material that looks like coffee grounds Swelling of the ankles, hands, or feet Side effects that usually do not require medical attention (report to your care team if they continue or are bothersome): Acne General discomfort and fatigue Headache Increase in appetite Nausea Trouble sleeping Weight gain This list may not describe all possible side effects. Call your doctor for medical advice about side effects. You may report side effects to FDA at 1-800-FDA-1088. Where should I keep my medication? Keep out of the reach of children. Store at room temperature between 15 and 30 degrees C (59 and 86 degrees F). Protect from light. Keep  container tightly closed. Throw away any unused medication after the expiration date. NOTE: This sheet is a summary. It may not cover all possible information. If you have questions about this medicine, talk to your doctor, pharmacist, or health care provider.  2022 [employer] (2020-07-01 11:54:49)

## 2021-02-06 DIAGNOSIS — M545 Low back pain, unspecified: Secondary | ICD-10-CM | POA: Diagnosis not present

## 2021-02-08 DIAGNOSIS — F332 Major depressive disorder, recurrent severe without psychotic features: Secondary | ICD-10-CM | POA: Diagnosis not present

## 2021-02-09 ENCOUNTER — Ambulatory Visit (INDEPENDENT_AMBULATORY_CARE_PROVIDER_SITE_OTHER)
Admission: RE | Admit: 2021-02-09 | Discharge: 2021-02-09 | Disposition: A | Discharge status: Home / Self Care | Payer: Self-pay | Source: Ambulatory Visit | Attending: Internal Medicine | Admitting: Internal Medicine

## 2021-02-09 ENCOUNTER — Other Ambulatory Visit: Payer: Self-pay

## 2021-02-09 DIAGNOSIS — E7800 Pure hypercholesterolemia, unspecified: Secondary | ICD-10-CM

## 2021-02-10 ENCOUNTER — Encounter: Payer: Self-pay | Admitting: Internal Medicine

## 2021-02-10 ENCOUNTER — Other Ambulatory Visit: Payer: Self-pay | Admitting: Internal Medicine

## 2021-02-10 DIAGNOSIS — R931 Abnormal findings on diagnostic imaging of heart and coronary circulation: Secondary | ICD-10-CM

## 2021-02-27 ENCOUNTER — Other Ambulatory Visit: Payer: Self-pay | Admitting: Internal Medicine

## 2021-02-27 DIAGNOSIS — M9902 Segmental and somatic dysfunction of thoracic region: Secondary | ICD-10-CM | POA: Diagnosis not present

## 2021-02-27 DIAGNOSIS — M5440 Lumbago with sciatica, unspecified side: Secondary | ICD-10-CM | POA: Diagnosis not present

## 2021-02-27 DIAGNOSIS — M9903 Segmental and somatic dysfunction of lumbar region: Secondary | ICD-10-CM | POA: Diagnosis not present

## 2021-02-27 DIAGNOSIS — M5136 Other intervertebral disc degeneration, lumbar region: Secondary | ICD-10-CM | POA: Diagnosis not present

## 2021-02-27 NOTE — Telephone Encounter (Signed)
Please refill as per office routine med refill policy (all routine meds to be refilled for 3 mo or monthly (per pt preference) up to one year from last visit, then month to month grace period for 3 mo, then further med refills will have to be denied) ? ?

## 2021-02-28 DIAGNOSIS — D225 Melanocytic nevi of trunk: Secondary | ICD-10-CM | POA: Diagnosis not present

## 2021-02-28 DIAGNOSIS — D2262 Melanocytic nevi of left upper limb, including shoulder: Secondary | ICD-10-CM | POA: Diagnosis not present

## 2021-02-28 DIAGNOSIS — L821 Other seborrheic keratosis: Secondary | ICD-10-CM | POA: Diagnosis not present

## 2021-02-28 DIAGNOSIS — L918 Other hypertrophic disorders of the skin: Secondary | ICD-10-CM | POA: Diagnosis not present

## 2021-02-28 DIAGNOSIS — L718 Other rosacea: Secondary | ICD-10-CM | POA: Diagnosis not present

## 2021-03-01 NOTE — Progress Notes (Signed)
Cardiology Office Note:    Date:  03/02/2021   ID:  Paul Tucker, DOB Mar 16, 1951, MRN [id number]  PCP:  Biagio Borg, MD  Cardiologist:  None  Electrophysiologist:  None   Referring MD: Biagio Borg, MD   Chief Complaint  Patient presents with   Coronary Artery Disease    History of Present Illness:    Paul Tucker is a 70 y.o. male with a hx of hyperlipidemia, ADHD, depression, OSA who is referred by Dr. Jenny Reichmann for evaluation of CAD.  Underwent calcium score 02/09/2021, which was 1216 (89th percentile).  He had previous history of OSA but lost 50 pounds and is now off CPAP.  Denies any chest pain, dyspnea, syncope, lower extremity edema, or palpitations.  Reports he does not exercise.  He will walk in the evenings for 10 minutes.  Does report intermittent dizziness but denies any syncope.  Brought home BP log, has been 100s to 130s over 60s to 70s.  No smoking history.  Father had MI in 39s and had ICD.  Past Medical History:  Diagnosis Date   Acute prostatitis 04/04/2007   ADD 01/17/2007   Anxiety    DEPRESSION 01/17/2007   Treatment Resident Depression   Dermatophytosis of groin and perianal area 05/31/2008   DIVERTICULOSIS, COLON 04/06/2007   Dysuria 01/27/2009   FLANK PAIN, LEFT 09/30/2007   Flushing 08/26/2009   GLUCOSE INTOLERANCE 04/04/2007   Gross hematuria 01/27/2009   HEMATOCHEZIA 07/26/2008   HYPERLIPIDEMIA 01/20/2007   HYPOGONADISM 06/13/2009   Impaired glucose tolerance 12/10/2010   INSOMNIA 04/04/2007   LIBIDO, DECREASED 09/01/2438   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   LOW BACK PAIN 01/20/2007   OBSTRUCTIVE SLEEP APNEA 04/04/2007   OTITIS MEDIA, ACUTE, BILATERAL 06/13/2009   PANCREATITIS, HX OF 01/17/2007   PSA, INCREASED 11/18/2009   RASH-NONVESICULAR 08/26/2009   SINUSITIS- ACUTE-NOS 04/04/2007   SWEATING 09/30/2007   THRUSH 04/04/2007   TONSILLECTOMY AND ADENOIDECTOMY, HX OF 01/17/2007   URI 07/25/2010   URINARY RETENTION 01/27/2009   VERTIGO 06/13/2009    Past Surgical  History:  Procedure Laterality Date   CHOLECYSTECTOMY  2005   HEMORRHOID BANDING  2015   HERNIA REPAIR  1957   INGUINAL HERNIA REPAIR Right 07/27/2020   Procedure: RIGHT INGUINAL HERNIA REPAIR WITH MESH;  Surgeon: Erroll Luna, MD;  Location: [place];  Service: General;  Laterality: Right;   TONSILLECTOMY     VASECTOMY  1987    Current Medications: Current Meds  Medication Sig   Armodafinil 150 MG tablet Take 150 mg by mouth daily.   aspirin 81 MG EC tablet Take 81 mg by mouth daily.   Ciclopirox 0.77 % gel USE AS DIRECTED TOPCIALLY   diphenhydrAMINE (BENADRYL) 25 MG tablet Take 25 mg by mouth every 6 (six) hours as needed for allergies.   fluocinonide cream (LIDEX) 1.02 % Apply 1 application topically 2 (two) times daily.   ibuprofen (ADVIL) 800 MG tablet Take 1 tablet (800 mg total) by mouth every 8 (eight) hours as needed.   LORazepam (ATIVAN) 0.5 MG tablet Take 0.5 mg by mouth 2 (two) times daily as needed.   lovastatin (MEVACOR) 40 MG tablet TAKE ONE TABLET BY MOUTH AT BEDTIME   meclizine (ANTIVERT) 25 MG tablet TAKE ONE TABLET BY MOUTH THREE TIMES A DAY AS NEEDED FOR DIZZINESS   meloxicam (MOBIC) 15 MG tablet Take 15 mg by mouth daily.   mometasone (ELOCON) 0.1 % cream Apply 1 application topically daily.   OLANZapine (  ZYPREXA) 5 MG tablet Take 5 mg by mouth at bedtime.   tamsulosin (FLOMAX) 0.4 MG CAPS capsule TAKE ONE CAPSULE BY MOUTH DAILY   TRINTELLIX 20 MG TABS tablet Take 20 mg by mouth daily.   zolpidem (AMBIEN) 10 MG tablet TAKE ONE TABLET BY MOUTH EVERY NIGHT AT BEDTIME     Allergies:   Atrovent [ipratropium] and Pravastatin sodium   Social History   Socioeconomic History   Marital status: Married    Spouse name: Not on file   Number of children: 3   Years of education: Not on file   Highest education level: Not on file  Occupational History   Occupation: Brewing technologist  Tobacco Use   Smoking status: Never   Smokeless tobacco: Never  Vaping Use    Vaping Use: Never used  Substance and Sexual Activity   Alcohol use: Never    Alcohol/week: 0.0 standard drinks   Drug use: No   Sexual activity: Not on file  Other Topics Concern   Not on file  Social History Narrative   Not on file   Social Determinants of Health   Financial Resource Strain: Low Risk    Difficulty of Paying Living Expenses: Not hard at all  Food Insecurity: No Food Insecurity   Worried About Charity fundraiser in the Last Year: Never true   [place] in the Last Year: Never true  Transportation Needs: No Transportation Needs   Lack of Transportation (Medical): No   Lack of Transportation (Non-Medical): No  Physical Activity: Inactive   Days of Exercise per Week: 0 days   Minutes of Exercise per Session: 0 min  Stress: No Stress Concern Present   Feeling of Stress : Not at all  Social Connections: Not on file     Family History: The patient's family history includes Coronary artery disease in an other family member; Diabetes in an other family member; Emphysema in his father; Heart disease in his father. There is no history of Colon cancer, Esophageal cancer, Rectal cancer, or Stomach cancer.  ROS:   Please see the history of present illness.     All other systems reviewed and are negative.  EKGs/Labs/Other Studies Reviewed:    The following studies were reviewed today:   EKG:  EKG is ordered today.  The ekg ordered today demonstrates NSR, rate 65, no ST abnormalities  Recent Labs: 07/12/2020: TSH 1.30 07/27/2020: Hemoglobin 14.6; Platelets 212 01/19/2021: ALT 15; BUN 20; Creatinine, Ser 1.04; Potassium 4.0; Sodium 140  Recent Lipid Panel    Component Value Date/Time   CHOL 123 01/19/2021 0822   TRIG 80.0 01/19/2021 0822   HDL 49.00 01/19/2021 0822   CHOLHDL 3 01/19/2021 0822   VLDL 16.0 01/19/2021 0822   LDLCALC 58 01/19/2021 0822   LDLDIRECT 82.0 02/28/2017 0725    Physical Exam:    VS:  BP 129/76   Pulse 65   Ht 6\' 1"  (1.854 m)    Wt 201 lb 12.8 oz (91.5 kg)   SpO2 97%   BMI 26.62 kg/m     Wt Readings from Last 3 Encounters:  03/02/21 201 lb 12.8 oz (91.5 kg)  02/03/21 203 lb (92.1 kg)  01/25/21 204 lb (92.5 kg)     GEN:  Well nourished, well developed in no acute distress HEENT: Normal NECK: No JVD; No carotid bruits LYMPHATICS: No lymphadenopathy CARDIAC: RRR, no murmurs, rubs, gallops RESPIRATORY:  Clear to auscultation without rales, wheezing or rhonchi  ABDOMEN: Soft, non-tender, non-distended MUSCULOSKELETAL:  No edema; No deformity  SKIN: Warm and dry NEUROLOGIC:  Alert and oriented x 3 PSYCHIATRIC:  Normal affect   ASSESSMENT:    1. Elevated coronary artery calcium score   2. Hyperlipidemia, unspecified hyperlipidemia type    PLAN:    CAD: Underwent calcium score 02/09/2021, which was 1216 (89th percentile).  Denies anginal symptoms. -Continue aspirin 81 mg daily -Continue lovastatin 40 mg.  LDL 58, at goal <70 -Given significantly elevated calcium score, recommend Lexiscan Myoview to rule out ischemia.  Hyperlipidemia: On lovastatin 40 mg daily.  LDL 58 on 01/19/2021.  RTC in 6 months   Shared Decision Making/Informed Consent The risks [chest pain, shortness of breath, cardiac arrhythmias, dizziness, blood pressure fluctuations, myocardial infarction, stroke/transient ischemic attack, nausea, vomiting, allergic reaction, radiation exposure, metallic taste sensation and life-threatening complications (estimated to be 1 in 10,000)], benefits (risk stratification, diagnosing coronary artery disease, treatment guidance) and alternatives of a nuclear stress test were discussed in detail with Paul Tucker and he agrees to proceed.    Medication Adjustments/Labs and Tests Ordered: Current medicines are reviewed at length with the patient today.  Concerns regarding medicines are outlined above.  Orders Placed This Encounter  Procedures   MYOCARDIAL PERFUSION IMAGING   EKG 12-Lead    No orders of  the defined types were placed in this encounter.   Patient Instructions  Medication Instructions:  Your physician recommends that you continue on your current medications as directed. Please refer to the Current Medication list given to you today.  Testing/Procedures: Your physician has requested that you have a Lexicographer (At [employer]). For further information please visit [email or web address]. Please follow instruction sheet, as given.   How to prepare for your Myocardial Perfusion Test: Do not eat or drink 3 hours prior to your test, except you may have water. Do not consume products containing caffeine (regular or decaffeinated) 12 hours prior to your test. (ex: coffee, chocolate, sodas, tea). Do bring a list of your current medications with you.  If not listed below, you may take your medications as normal. Do wear comfortable clothes (no dresses or overalls) and walking shoes, tennis shoes preferred (No heels or open toe shoes are allowed). Do NOT wear cologne, perfume, aftershave, or lotions (deodorant is allowed). The test will take approximately 3 to 4 hours to complete If these instructions are not followed, your test will have to be rescheduled.  Follow-Up: At [facility], you and your health needs are our priority.  As part of our continuing mission to provide you with exceptional heart care, we have created designated Provider Care Teams.  These Care Teams include your primary Cardiologist (physician) and Advanced Practice Providers (APPs -  Physician Assistants and Nurse Practitioners) who all work together to provide you with the care you need, when you need it.  We recommend signing up for the patient portal called "MyChart".  Sign up information is provided on this After Visit Summary.  MyChart is used to connect with patients for Virtual Visits (Telemedicine).  Patients are able to view lab/test results, encounter notes, upcoming appointments, etc.  Non-urgent  messages can be sent to your provider as well.   To learn more about what you can do with MyChart, go to [email or web address].    Your next appointment:   6 month(s)  The format for your next appointment:   In Person  Provider:   Oswaldo Milian, MD   Signed, Donato Heinz, MD  03/02/2021 8:43 AM    [facility]

## 2021-03-02 ENCOUNTER — Other Ambulatory Visit: Payer: Self-pay

## 2021-03-02 ENCOUNTER — Telehealth (HOSPITAL_COMMUNITY): Payer: Self-pay | Admitting: *Deleted

## 2021-03-02 ENCOUNTER — Ambulatory Visit: Payer: PPO | Admitting: Cardiology

## 2021-03-02 VITALS — BP 129/76 | HR 65 | Ht 73.0 in | Wt 201.8 lb

## 2021-03-02 DIAGNOSIS — E785 Hyperlipidemia, unspecified: Secondary | ICD-10-CM | POA: Diagnosis not present

## 2021-03-02 DIAGNOSIS — R931 Abnormal findings on diagnostic imaging of heart and coronary circulation: Secondary | ICD-10-CM | POA: Diagnosis not present

## 2021-03-02 NOTE — Patient Instructions (Signed)
Medication Instructions:  Your physician recommends that you continue on your current medications as directed. Please refer to the Current Medication list given to you today.  Testing/Procedures: Your physician has requested that you have a Lexicographer (At [employer]). For further information please visit [email or web address]. Please follow instruction sheet, as given.   How to prepare for your Myocardial Perfusion Test: Do not eat or drink 3 hours prior to your test, except you may have water. Do not consume products containing caffeine (regular or decaffeinated) 12 hours prior to your test. (ex: coffee, chocolate, sodas, tea). Do bring a list of your current medications with you.  If not listed below, you may take your medications as normal. Do wear comfortable clothes (no dresses or overalls) and walking shoes, tennis shoes preferred (No heels or open toe shoes are allowed). Do NOT wear cologne, perfume, aftershave, or lotions (deodorant is allowed). The test will take approximately 3 to 4 hours to complete If these instructions are not followed, your test will have to be rescheduled.  Follow-Up: At [facility], you and your health needs are our priority.  As part of our continuing mission to provide you with exceptional heart care, we have created designated Provider Care Teams.  These Care Teams include your primary Cardiologist (physician) and Advanced Practice Providers (APPs -  Physician Assistants and Nurse Practitioners) who all work together to provide you with the care you need, when you need it.  We recommend signing up for the patient portal called "MyChart".  Sign up information is provided on this After Visit Summary.  MyChart is used to connect with patients for Virtual Visits (Telemedicine).  Patients are able to view lab/test results, encounter notes, upcoming appointments, etc.  Non-urgent messages can be sent to your provider as well.   To learn more about what you  can do with MyChart, go to [email or web address].    Your next appointment:   6 month(s)  The format for your next appointment:   In Person  Provider:   Oswaldo Milian, MD

## 2021-03-02 NOTE — Telephone Encounter (Signed)
Patient given detailed instructions per Myocardial Perfusion Study Information Sheet for the test on 03/08/21. Patient notified to arrive 15 minutes early and that it is imperative to arrive on time for appointment to keep from having the test rescheduled.  If you need to cancel or reschedule your appointment, please call the office within 24 hours of your appointment. . Patient verbalized understanding. Kirstie Peri

## 2021-03-03 ENCOUNTER — Ambulatory Visit (INDEPENDENT_AMBULATORY_CARE_PROVIDER_SITE_OTHER): Payer: PPO

## 2021-03-03 DIAGNOSIS — Z23 Encounter for immunization: Secondary | ICD-10-CM

## 2021-03-03 NOTE — Progress Notes (Signed)
Pt given High Dose Flu vacc w/o any complications.

## 2021-03-04 ENCOUNTER — Ambulatory Visit: Payer: PPO

## 2021-03-06 DIAGNOSIS — R972 Elevated prostate specific antigen [PSA]: Secondary | ICD-10-CM | POA: Diagnosis not present

## 2021-03-08 ENCOUNTER — Other Ambulatory Visit: Payer: Self-pay

## 2021-03-08 ENCOUNTER — Ambulatory Visit (HOSPITAL_COMMUNITY): Payer: PPO | Attending: Cardiology

## 2021-03-08 ENCOUNTER — Encounter (HOSPITAL_COMMUNITY): Payer: PPO

## 2021-03-08 DIAGNOSIS — R42 Dizziness and giddiness: Secondary | ICD-10-CM | POA: Insufficient documentation

## 2021-03-08 DIAGNOSIS — R931 Abnormal findings on diagnostic imaging of heart and coronary circulation: Secondary | ICD-10-CM | POA: Diagnosis not present

## 2021-03-08 DIAGNOSIS — Z8249 Family history of ischemic heart disease and other diseases of the circulatory system: Secondary | ICD-10-CM | POA: Diagnosis not present

## 2021-03-08 LAB — MYOCARDIAL PERFUSION IMAGING
Base ST Depression (mm): 0 mm
LV dias vol: 86 mL (ref 62–150)
LV sys vol: 30 mL
Nuc Stress EF: 63 %
Peak HR: 92 {beats}/min
Rest HR: 63 {beats}/min
Rest Nuclear Isotope Dose: 10.2 mCi
SDS: 0
SRS: 0
SSS: 0
ST Depression (mm): 0 mm
Stress Nuclear Isotope Dose: 31.5 mCi
TID: 0.88

## 2021-03-08 MED ORDER — REGADENOSON 0.4 MG/5ML IV SOLN
0.4000 mg | Freq: Once | INTRAVENOUS | Status: AC
  Administered 2021-03-08: 0.4 mg via INTRAVENOUS

## 2021-03-08 MED ORDER — TECHNETIUM TC 99M TETROFOSMIN IV KIT
10.2000 | PACK | Freq: Once | INTRAVENOUS | Status: AC | PRN
  Administered 2021-03-08: 10.2 via INTRAVENOUS
  Filled 2021-03-08: qty 11

## 2021-03-08 MED ORDER — TECHNETIUM TC 99M TETROFOSMIN IV KIT
31.5000 | PACK | Freq: Once | INTRAVENOUS | Status: AC | PRN
  Administered 2021-03-08: 31.5 via INTRAVENOUS
  Filled 2021-03-08: qty 32

## 2021-03-13 DIAGNOSIS — N401 Enlarged prostate with lower urinary tract symptoms: Secondary | ICD-10-CM | POA: Diagnosis not present

## 2021-03-13 DIAGNOSIS — R972 Elevated prostate specific antigen [PSA]: Secondary | ICD-10-CM | POA: Diagnosis not present

## 2021-03-13 DIAGNOSIS — R3912 Poor urinary stream: Secondary | ICD-10-CM | POA: Diagnosis not present

## 2021-04-04 DIAGNOSIS — H81399 Other peripheral vertigo, unspecified ear: Secondary | ICD-10-CM | POA: Diagnosis not present

## 2021-04-04 DIAGNOSIS — H52223 Regular astigmatism, bilateral: Secondary | ICD-10-CM | POA: Diagnosis not present

## 2021-04-04 DIAGNOSIS — H5203 Hypermetropia, bilateral: Secondary | ICD-10-CM | POA: Diagnosis not present

## 2021-04-04 DIAGNOSIS — H25813 Combined forms of age-related cataract, bilateral: Secondary | ICD-10-CM | POA: Diagnosis not present

## 2021-04-04 DIAGNOSIS — H524 Presbyopia: Secondary | ICD-10-CM | POA: Diagnosis not present

## 2021-04-28 ENCOUNTER — Other Ambulatory Visit: Payer: Self-pay

## 2021-04-28 ENCOUNTER — Ambulatory Visit (INDEPENDENT_AMBULATORY_CARE_PROVIDER_SITE_OTHER): Payer: PPO

## 2021-04-28 ENCOUNTER — Telehealth: Payer: Self-pay

## 2021-04-28 VITALS — BP 130/80 | HR 70 | Temp 98.2°F | Ht 73.0 in | Wt 213.2 lb

## 2021-04-28 DIAGNOSIS — Z0000 Encounter for general adult medical examination without abnormal findings: Secondary | ICD-10-CM

## 2021-04-28 DIAGNOSIS — M7630 Iliotibial band syndrome, unspecified leg: Secondary | ICD-10-CM

## 2021-04-28 NOTE — Patient Instructions (Signed)
Paul Tucker , Thank you for taking time to come for your Medicare Wellness Visit. I appreciate your ongoing commitment to your health goals. Please review the following plan we discussed and let me know if I can assist you in the future.   Screening recommendations/referrals: Colonoscopy: 07/05/2014; due every 10 years (due 06/2024) Recommended yearly ophthalmology/optometry visit for glaucoma screening and checkup Recommended yearly dental visit for hygiene and checkup  Vaccinations: Influenza vaccine: 03/03/2021 Pneumococcal vaccine: 03/06/2017, 07/20/2020 Tdap vaccine: due Shingles vaccine: 07/24/2018, 11/26/2018   Covid-19: 06/25/2019, 07/20/2019, 02/15/2020, 02/09/2021  Advanced directives: Please bring a copy of your health care power of attorney and living will to the office at your convenience.  Conditions/risks identified: Yes; goal is to increase physical activity.  Next appointment: Scheduled for 04/30/2022 at 10:40 am  Preventive Care 65 Years and Older, Male Preventive care refers to lifestyle choices and visits with your health care provider that can promote health and wellness. What does preventive care include? A yearly physical exam. This is also called an annual well check. Dental exams once or twice a year. Routine eye exams. Ask your health care provider how often you should have your eyes checked. Personal lifestyle choices, including: Daily care of your teeth and gums. Regular physical activity. Eating a healthy diet. Avoiding tobacco and drug use. Limiting alcohol use. Practicing safe sex. Taking low doses of aspirin every day. Taking vitamin and mineral supplements as recommended by your health care provider. What happens during an annual well check? The services and screenings done by your health care provider during your annual well check will depend on your age, overall health, lifestyle risk factors, and family history of disease. Counseling  Your health care  provider may ask you questions about your: Alcohol use. Tobacco use. Drug use. Emotional well-being. Home and relationship well-being. Sexual activity. Eating habits. History of falls. Memory and ability to understand (cognition). Work and work Statistician. Screening  You may have the following tests or measurements: Height, weight, and BMI. Blood pressure. Lipid and cholesterol levels. These may be checked every 5 years, or more frequently if you are over 48 years old. Skin check. Lung cancer screening. You may have this screening every year starting at age 76 if you have a 30-pack-year history of smoking and currently smoke or have quit within the past 15 years. Fecal occult blood test (FOBT) of the stool. You may have this test every year starting at age 63. Flexible sigmoidoscopy or colonoscopy. You may have a sigmoidoscopy every 5 years or a colonoscopy every 10 years starting at age 72. Prostate cancer screening. Recommendations will vary depending on your family history and other risks. Hepatitis C blood test. Hepatitis B blood test. Sexually transmitted disease (STD) testing. Diabetes screening. This is done by checking your blood sugar (glucose) after you have not eaten for a while (fasting). You may have this done every 1-3 years. Abdominal aortic aneurysm (AAA) screening. You may need this if you are a current or former smoker. Osteoporosis. You may be screened starting at age 75 if you are at high risk. Talk with your health care provider about your test results, treatment options, and if necessary, the need for more tests. Vaccines  Your health care provider may recommend certain vaccines, such as: Influenza vaccine. This is recommended every year. Tetanus, diphtheria, and acellular pertussis (Tdap, Td) vaccine. You may need a Td booster every 10 years. Zoster vaccine. You may need this after age 60. Pneumococcal 13-valent conjugate (PCV13)  vaccine. One dose is  recommended after age 17. Pneumococcal polysaccharide (PPSV23) vaccine. One dose is recommended after age 2. Talk to your health care provider about which screenings and vaccines you need and how often you need them. This information is not intended to replace advice given to you by your health care provider. Make sure you discuss any questions you have with your health care provider. Document Released: 06/03/2015 Document Revised: 01/25/2016 Document Reviewed: 03/08/2015 Elsevier Interactive Patient Education  2017 [employer] Prevention in the Home Falls can cause injuries. They can happen to people of all ages. There are many things you can do to make your home safe and to help prevent falls. What can I do on the outside of my home? Regularly fix the edges of walkways and driveways and fix any cracks. Remove anything that might make you trip as you walk through a door, such as a raised step or threshold. Trim any bushes or trees on the path to your home. Use bright outdoor lighting. Clear any walking paths of anything that might make someone trip, such as rocks or tools. Regularly check to see if handrails are loose or broken. Make sure that both sides of any steps have handrails. Any raised decks and porches should have guardrails on the edges. Have any leaves, snow, or ice cleared regularly. Use sand or salt on walking paths during winter. Clean up any spills in your garage right away. This includes oil or grease spills. What can I do in the bathroom? Use night lights. Install grab bars by the toilet and in the tub and shower. Do not use towel bars as grab bars. Use non-skid mats or decals in the tub or shower. If you need to sit down in the shower, use a plastic, non-slip stool. Keep the floor dry. Clean up any water that spills on the floor as soon as it happens. Remove soap buildup in the tub or shower regularly. Attach bath mats securely with double-sided non-slip rug  tape. Do not have throw rugs and other things on the floor that can make you trip. What can I do in the bedroom? Use night lights. Make sure that you have a light by your bed that is easy to reach. Do not use any sheets or blankets that are too big for your bed. They should not hang down onto the floor. Have a firm chair that has side arms. You can use this for support while you get dressed. Do not have throw rugs and other things on the floor that can make you trip. What can I do in the kitchen? Clean up any spills right away. Avoid walking on wet floors. Keep items that you use a lot in easy-to-reach places. If you need to reach something above you, use a strong step stool that has a grab bar. Keep electrical cords out of the way. Do not use floor polish or wax that makes floors slippery. If you must use wax, use non-skid floor wax. Do not have throw rugs and other things on the floor that can make you trip. What can I do with my stairs? Do not leave any items on the stairs. Make sure that there are handrails on both sides of the stairs and use them. Fix handrails that are broken or loose. Make sure that handrails are as [place] as the stairways. Check any carpeting to make sure that it is firmly attached to the stairs. Fix any carpet that is loose  or worn. Avoid having throw rugs at the top or bottom of the stairs. If you do have throw rugs, attach them to the floor with carpet tape. Make sure that you have a light switch at the top of the stairs and the bottom of the stairs. If you do not have them, ask someone to add them for you. What else can I do to help prevent falls? Wear shoes that: Do not have high heels. Have rubber bottoms. Are comfortable and fit you well. Are closed at the toe. Do not wear sandals. If you use a stepladder: Make sure that it is fully opened. Do not climb a closed stepladder. Make sure that both sides of the stepladder are locked into place. Ask someone to  hold it for you, if possible. Clearly mark and make sure that you can see: Any grab bars or handrails. First and last steps. Where the edge of each step is. Use tools that help you move around (mobility aids) if they are needed. These include: Canes. Walkers. Scooters. Crutches. Turn on the lights when you go into a dark area. Replace any light bulbs as soon as they burn out. Set up your furniture so you have a clear path. Avoid moving your furniture around. If any of your floors are uneven, fix them. If there are any pets around you, be aware of where they are. Review your medicines with your doctor. Some medicines can make you feel dizzy. This can increase your chance of falling. Ask your doctor what other things that you can do to help prevent falls. This information is not intended to replace advice given to you by your health care provider. Make sure you discuss any questions you have with your health care provider. Document Released: 03/03/2009 Document Revised: 10/13/2015 Document Reviewed: 06/11/2014 Elsevier Interactive Patient Education  2017 [employer].

## 2021-04-28 NOTE — Telephone Encounter (Signed)
Ok this is done 

## 2021-04-28 NOTE — Telephone Encounter (Signed)
Patient would like to be referred for therapy to help with stretching due to IT band issues.

## 2021-04-28 NOTE — Progress Notes (Addendum)
Subjective:   Paul Tucker is a 70 y.o. male who presents for Medicare Annual/Subsequent preventive examination.  Review of Systems     Cardiac Risk Factors include: advanced age (>5men, >61 women);family history of premature cardiovascular disease;male gender;dyslipidemia     Objective:    Today's Vitals   04/28/21 1122  BP: 130/80  Pulse: 70  Temp: 98.2 F (36.8 C)  TempSrc: Temporal  SpO2: 95%  Weight: 213 lb 3.2 oz (96.7 kg)  Height: 6\' 1"  (1.854 m)   Body mass index is 28.13 kg/m.  Advanced Directives 04/28/2021 07/27/2020 03/14/2020  Does Patient Have a Medical Advance Directive? Yes No Yes  Type of Advance Directive Living will;Healthcare Power of [place];Living will  Does patient want to make changes to medical advance directive? No - Patient declined - No - Patient declined  Copy of [place] in Chart? No - copy requested - No - copy requested  Would patient like information on creating a medical advance directive? - No - Patient declined -    Current Medications (verified) Outpatient Encounter Medications as of 04/28/2021  Medication Sig   diclofenac Sodium (VOLTAREN) 1 % GEL Apply 4 g topically in the morning and at bedtime.   Armodafinil 150 MG tablet Take 150 mg by mouth daily.   aspirin 81 MG EC tablet Take 81 mg by mouth daily.   Ciclopirox 0.77 % gel USE AS DIRECTED TOPCIALLY   diphenhydrAMINE (BENADRYL) 25 MG tablet Take 25 mg by mouth every 6 (six) hours as needed for allergies.   fluocinonide cream (LIDEX) 3.15 % Apply 1 application topically 2 (two) times daily.   ibuprofen (ADVIL) 800 MG tablet Take 1 tablet (800 mg total) by mouth every 8 (eight) hours as needed.   LORazepam (ATIVAN) 0.5 MG tablet Take 0.5 mg by mouth 2 (two) times daily as needed.   lovastatin (MEVACOR) 40 MG tablet TAKE ONE TABLET BY MOUTH AT BEDTIME   meclizine (ANTIVERT) 25 MG tablet TAKE ONE TABLET BY MOUTH THREE TIMES A DAY AS  NEEDED FOR DIZZINESS   meloxicam (MOBIC) 15 MG tablet Take 15 mg by mouth daily.   modafinil (PROVIGIL) 200 MG tablet Take 200 mg by mouth daily.   mometasone (ELOCON) 0.1 % cream Apply 1 application topically daily.   OLANZapine (ZYPREXA) 5 MG tablet Take 5 mg by mouth at bedtime.   predniSONE (DELTASONE) 20 MG tablet Take 2 tablets (40 mg total) by mouth daily with breakfast.   tamsulosin (FLOMAX) 0.4 MG CAPS capsule TAKE ONE CAPSULE BY MOUTH DAILY   TRINTELLIX 20 MG TABS tablet Take 20 mg by mouth daily.   zolpidem (AMBIEN) 10 MG tablet TAKE ONE TABLET BY MOUTH EVERY NIGHT AT BEDTIME   No facility-administered encounter medications on file as of 04/28/2021.    Allergies (verified) Atrovent [ipratropium] and Pravastatin sodium   History: Past Medical History:  Diagnosis Date   Acute prostatitis 04/04/2007   ADD 01/17/2007   Anxiety    DEPRESSION 01/17/2007   Treatment Resident Depression   Dermatophytosis of groin and perianal area 05/31/2008   DIVERTICULOSIS, COLON 04/06/2007   Dysuria 01/27/2009   FLANK PAIN, LEFT 09/30/2007   Flushing 08/26/2009   GLUCOSE INTOLERANCE 04/04/2007   Gross hematuria 01/27/2009   HEMATOCHEZIA 07/26/2008   HYPERLIPIDEMIA 01/20/2007   HYPOGONADISM 06/13/2009   Impaired glucose tolerance 12/10/2010   INSOMNIA 04/04/2007   LIBIDO, DECREASED 1/76/1607   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   LOW  BACK PAIN 01/20/2007   OBSTRUCTIVE SLEEP APNEA 04/04/2007   OTITIS MEDIA, ACUTE, BILATERAL 06/13/2009   PANCREATITIS, HX OF 01/17/2007   PSA, INCREASED 11/18/2009   RASH-NONVESICULAR 08/26/2009   SINUSITIS- ACUTE-NOS 04/04/2007   SWEATING 09/30/2007   THRUSH 04/04/2007   TONSILLECTOMY AND ADENOIDECTOMY, HX OF 01/17/2007   URI 07/25/2010   URINARY RETENTION 01/27/2009   VERTIGO 06/13/2009   Past Surgical History:  Procedure Laterality Date   CHOLECYSTECTOMY  2005   HEMORRHOID BANDING  2015   HERNIA REPAIR  1957   INGUINAL HERNIA REPAIR Right 07/27/2020   Procedure: RIGHT  INGUINAL HERNIA REPAIR WITH MESH;  Surgeon: Erroll Luna, MD;  Location: [place];  Service: General;  Laterality: Right;   TONSILLECTOMY     VASECTOMY  1987   Family History  Problem Relation Age of Onset   Emphysema Father    Heart disease Father    Coronary artery disease Other        male, 1st degree relative   Diabetes Other        1st degree relative   Colon cancer Neg Hx    Esophageal cancer Neg Hx    Rectal cancer Neg Hx    Stomach cancer Neg Hx    Social History   Socioeconomic History   Marital status: Married    Spouse name: Not on file   Number of children: 3   Years of education: Not on file   Highest education level: Not on file  Occupational History   Occupation: Brewing technologist  Tobacco Use   Smoking status: Never   Smokeless tobacco: Never  Vaping Use   Vaping Use: Never used  Substance and Sexual Activity   Alcohol use: Never    Alcohol/week: 0.0 standard drinks   Drug use: No   Sexual activity: Not on file  Other Topics Concern   Not on file  Social History Narrative   Not on file   Social Determinants of Health   Financial Resource Strain: Low Risk    Difficulty of Paying Living Expenses: Not hard at all  Food Insecurity: No Food Insecurity   Worried About Charity fundraiser in the Last Year: Never true   Arboriculturist in the Last Year: Never true  Transportation Needs: No Transportation Needs   Lack of Transportation (Medical): No   Lack of Transportation (Non-Medical): No  Physical Activity: Inactive   Days of Exercise per Week: 0 days   Minutes of Exercise per Session: 0 min  Stress: No Stress Concern Present   Feeling of Stress : Not at all  Social Connections: Socially Integrated   Frequency of Communication with Friends and Family: More than three times a week   Frequency of Social Gatherings with Friends and Family: More than three times a week   Attends Religious Services: More than 4 times per year   Active Member of  [employer] or Organizations: Yes   Attends Music therapist: More than 4 times per year   Marital Status: Married    Tobacco Counseling Counseling given: Not Answered   Clinical Intake:  Pre-visit preparation completed: Yes  Pain : 0-10 Pain Type: Chronic pain Pain Location: Back Pain Orientation: Lower Pain Descriptors / Indicators: Constant Pain Onset: More than a month ago Pain Frequency: Constant Pain Relieving Factors: Meloxicam, Voltaren Gel Effect of Pain on Daily Activities: Pain can diminish job performance, lower motivation to exercise, and prevent you from completing daily tasks. Pain produces  disability and affects the quality of life.  Pain Relieving Factors: Meloxicam, Voltaren Gel  BMI - recorded: 28.13 Nutritional Status: BMI 25 -29 Overweight Nutritional Risks: None Diabetes: No  How often do you need to have someone help you when you read instructions, pamphlets, or other written materials from your doctor or pharmacy?: 1 - Never What is the last grade level you completed in school?: HSG; some college courses  Diabetic? no  Interpreter Needed?: No  Information entered by :: [name], LPN   Activities of Daily Living In your present state of health, do you have any difficulty performing the following activities: 04/28/2021 07/27/2020  Hearing? N -  Vision? N -  Difficulty concentrating or making decisions? N -  Walking or climbing stairs? N -  Dressing or bathing? N -  Doing errands, shopping? N N  Preparing Food and eating ? N -  Using the Toilet? N -  In the past six months, have you accidently leaked urine? N -  Do you have problems with loss of bowel control? N -  Managing your Medications? N -  Managing your Finances? N -  Housekeeping or managing your Housekeeping? N -  Some recent data might be hidden    Patient Care Team: Biagio Borg, MD as PCP - General Marica Otter, OD as Consulting Physician  (Optometry)  Indicate any recent Medical Services you may have received from other than [facility] providers in the past year (date may be approximate).     Assessment:   This is a routine wellness examination for [place].  Hearing/Vision screen Hearing Screening - Comments:: Patient denied any hearing difficulty.   No hearing aids.  Vision Screening - Comments:: Patient wears corrective glasses/contacts.  Eye exam done annually by: Marica Otter, OD.  Dietary issues and exercise activities discussed: Current Exercise Habits: The patient does not participate in regular exercise at present, Exercise limited by: orthopedic condition(s);psychological condition(s)   Goals Addressed               This Visit's Progress     Patient Stated (pt-stated)        My goal is to increase my physical activity.      Depression Screen PHQ 2/9 Scores 04/28/2021 01/25/2021 01/25/2021 03/14/2020 07/23/2019 07/21/2018 07/03/2017  PHQ - 2 Score 0 0 2 2 1 1 2   PHQ- 9 Score - - 3 - - - -    Fall Risk Fall Risk  04/28/2021 01/25/2021 01/25/2021 07/20/2020 01/26/2020  Falls in the past year? 0 - 0 0 0  Number falls in past yr: 0 0 0 - 0  Injury with Fall? 0 0 0 - 0  Risk for fall due to : No Fall Risks - - - No Fall Risks  Follow up Falls prevention discussed - - - Falls evaluation completed    FALL RISK PREVENTION PERTAINING TO THE HOME:  Any stairs in or around the home? Yes  If so, are there any without handrails? No  Home free of loose throw rugs in walkways, pet beds, electrical cords, etc? Yes  Adequate lighting in your home to reduce risk of falls? Yes   ASSISTIVE DEVICES UTILIZED TO PREVENT FALLS:  Life alert? No  Use of a cane, walker or w/c? No  Grab bars in the bathroom? No  Shower chair or bench in shower? Yes  Elevated toilet seat or a handicapped toilet? Yes   TIMED UP AND GO:  Was the test performed? Yes .  Length of time to ambulate 10 feet: 6 sec.   Gait steady and fast without use of  assistive device  Cognitive Function:Normal cognitive status assessed by direct observation by this Nurse Health Advisor. No abnormalities found.          Immunizations Immunization History  Administered Date(s) Administered   Fluad Quad(high Dose 65+) 02/28/2019, 03/09/2020, 03/03/2021   Influenza Whole 02/28/2006, 04/04/2007   Influenza, High Dose Seasonal PF 03/06/2017, 02/07/2018   Influenza,inj,Quad PF,6+ Mos 02/25/2014, 03/10/2015, 03/13/2016   Influenza-Unspecified 02/19/2012   PFIZER(Purple Top)SARS-COV-2 Vaccination 06/25/2019, 07/20/2019, 02/15/2020   Pfizer Covid-19 Vaccine Bivalent Booster 15yrs & up 02/09/2021   Pneumococcal Conjugate-13 03/06/2017   Pneumococcal Polysaccharide-23 02/25/2014, 07/20/2020   Td 12/02/2008   Zoster Recombinat (Shingrix) 07/24/2018, 11/26/2018   Zoster, Live 12/04/2011    TDAP status: Due, Education has been provided regarding the importance of this vaccine. Advised may receive this vaccine at local pharmacy or Health Dept. Aware to provide a copy of the vaccination record if obtained from local pharmacy or Health Dept. Verbalized acceptance and understanding.  Flu Vaccine status: Up to date  Pneumococcal vaccine status: Up to date  Covid-19 vaccine status: Completed vaccines  Qualifies for Shingles Vaccine? Yes   Zostavax completed Yes   Shingrix Completed?: Yes  Screening Tests Health Maintenance  Topic Date Due   TETANUS/TDAP  01/25/2022 (Originally 12/03/2018)   HEMOGLOBIN A1C  07/19/2021   URINE MICROALBUMIN  07/20/2021   OPHTHALMOLOGY EXAM  09/12/2021   FOOT EXAM  01/25/2022   COLONOSCOPY (Pts 45-75yrs Insurance coverage will need to be confirmed)  07/05/2024   Pneumonia Vaccine 29+ Years old  Completed   INFLUENZA VACCINE  Completed   COVID-19 Vaccine  Completed   Hepatitis C Screening  Completed   Zoster Vaccines- Shingrix  Completed   HPV VACCINES  Aged Out    Health Maintenance  There are no preventive care  reminders to display for this patient.   Colorectal cancer screening: Type of screening: Colonoscopy. Completed 07/05/2014. Repeat every 10 years  Lung Cancer Screening: (Low Dose CT Chest recommended if Age 69-80 years, 30 pack-year currently smoking OR have quit w/in 15years.) does not qualify.   Lung Cancer Screening Referral: no  Additional Screening:  Hepatitis C Screening: does qualify; Completed yes  Vision Screening: Recommended annual ophthalmology exams for early detection of glaucoma and other disorders of the eye. Is the patient up to date with their annual eye exam?  Yes  Who is the provider or what is the name of the office in which the patient attends annual eye exams? Marica Otter, OD. If pt is not established with a provider, would they like to be referred to a provider to establish care? No .   Dental Screening: Recommended annual dental exams for proper oral hygiene  Community Resource Referral / Chronic Care Management: CRR required this visit?  No   CCM required this visit?  No      Plan:     I have personally reviewed and noted the following in the patient's chart:   Medical and social history Use of alcohol, tobacco or illicit drugs  Current medications and supplements including opioid prescriptions. Patient is not currently taking opioid prescriptions. Functional ability and status Nutritional status Physical activity Advanced directives List of other physicians Hospitalizations, surgeries, and ER visits in previous 12 months Vitals Screenings to include cognitive, depression, and falls Referrals and appointments  In addition, I have reviewed and discussed with patient certain preventive protocols,  quality metrics, and best practice recommendations. A written personalized care plan for preventive services as well as general preventive health recommendations were provided to patient.     Sheral Flow, LPN   55/07/7480   Nurse Notes:   Hearing Screening - Comments:: Patient denied any hearing difficulty.   No hearing aids.  Vision Screening - Comments:: Patient wears corrective glasses/contacts.  Eye exam done annually by: Marica Otter, OD.

## 2021-04-28 NOTE — Telephone Encounter (Signed)
Ok refer PT done

## 2021-05-09 ENCOUNTER — Other Ambulatory Visit: Payer: Self-pay | Admitting: Internal Medicine

## 2021-05-10 ENCOUNTER — Ambulatory Visit: Payer: PPO | Admitting: Cardiology

## 2021-05-23 NOTE — Therapy (Addendum)
OUTPATIENT PHYSICAL THERAPY LOWER EXTREMITY EVALUATION   Patient Name: Paul Tucker MRN: [id number] DOB:08/31/1950, 71 y.o., male Today's Date: 05/24/2021   PT End of Session - 05/24/21 1117     Visit Number 1    Number of Visits 13    Date for PT Re-Evaluation 07/08/21    Authorization Type [employer]    PT Start Time 1102    PT Stop Time 1150    PT Time Calculation (min) 48 min    Activity Tolerance Patient tolerated treatment well    Behavior During Therapy WFL for tasks assessed/performed             Past Medical History:  Diagnosis Date   Acute prostatitis 04/04/2007   ADD 01/17/2007   Anxiety    DEPRESSION 01/17/2007   Treatment Resident Depression   Dermatophytosis of groin and perianal area 05/31/2008   DIVERTICULOSIS, COLON 04/06/2007   Dysuria 01/27/2009   FLANK PAIN, LEFT 09/30/2007   Flushing 08/26/2009   GLUCOSE INTOLERANCE 04/04/2007   Gross hematuria 01/27/2009   HEMATOCHEZIA 07/26/2008   HYPERLIPIDEMIA 01/20/2007   HYPOGONADISM 06/13/2009   Impaired glucose tolerance 12/10/2010   INSOMNIA 04/04/2007   LIBIDO, DECREASED 08/30/8784   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   LOW BACK PAIN 01/20/2007   OBSTRUCTIVE SLEEP APNEA 04/04/2007   OTITIS MEDIA, ACUTE, BILATERAL 06/13/2009   PANCREATITIS, HX OF 01/17/2007   PSA, INCREASED 11/18/2009   RASH-NONVESICULAR 08/26/2009   SINUSITIS- ACUTE-NOS 04/04/2007   SWEATING 09/30/2007   THRUSH 04/04/2007   TONSILLECTOMY AND ADENOIDECTOMY, HX OF 01/17/2007   URI 07/25/2010   URINARY RETENTION 01/27/2009   VERTIGO 06/13/2009   Past Surgical History:  Procedure Laterality Date   CHOLECYSTECTOMY  2005   HEMORRHOID BANDING  2015   HERNIA REPAIR  1957   INGUINAL HERNIA REPAIR Right 07/27/2020   Procedure: RIGHT INGUINAL HERNIA REPAIR WITH MESH;  Surgeon: Erroll Luna, MD;  Location: [place];  Service: General;  Laterality: Right;   [place]   Patient Active Problem List   Diagnosis Date Noted   Right  inguinal hernia 06/09/2020   Vitamin D deficiency 07/25/2019   Elevated PSA 07/23/2019   Abnormal urinalysis 07/23/2019   Weight loss 07/19/2017   Decrease in appetite 07/19/2017   Anxiety 09/11/2016   Microalbuminuria 03/13/2016   Allergic rhinitis 09/13/2015   Allergic conjunctivitis 09/13/2015   B12 deficiency 08/14/2013   Internal hemorrhoids with other complication 76/72/0947   Fecal incontinence 11/12/2012   Dyspepsia and other specified disorders of function of stomach 11/12/2012   Right hip pain 06/11/2012   Dizziness 11/19/2011   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[employer]) 09/62/8366   Encounter for well adult exam with abnormal findings 09/10/2010   PSA, INCREASED 11/18/2009   HYPOGONADISM 06/13/2009   Gross hematuria 01/27/2009   URINARY RETENTION 01/27/2009   LIBIDO, DECREASED 12/02/2008   Dermatophytosis of groin and perianal area 29/47/6546   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   Diverticulosis of large intestine 04/06/2007   OBSTRUCTIVE SLEEP APNEA 04/04/2007   INSOMNIA 04/04/2007   Hyperlipidemia 01/20/2007   GERD 01/20/2007   LOW BACK PAIN 01/20/2007   Depression 01/17/2007   Attention deficit disorder 01/17/2007   PANCREATITIS, HX OF 01/17/2007   TONSILLECTOMY AND ADENOIDECTOMY, HX OF 01/17/2007    PCP: Biagio Borg, MD  REFERRING PROVIDER: Biagio Borg, MD  REFERRING DIAG: M76.30 (ICD-10-CM) - Iliotibial band syndrome, unspecified laterality   THERAPY DIAG:  Pain in right hip -  Plan: PT plan of care cert/re-cert  Difficulty in walking, not elsewhere classified - Plan: PT plan of care cert/re-cert  Stiffness of right hip, not elsewhere classified - Plan: PT plan of care cert/re-cert  ONSET DATE: 10 years ago   SUBJECTIVE:   SUBJECTIVE STATEMENT: Patient reports he can't walk for more than about 10 minutes before he starts to experience Rt lateral hip pain. Wife reports he ran into a church pew about 10 years ago, hitting the Rt hip that  caused a lot of bruising and has noticed this ongoing pain off/on ever since the incident. He reports the pain is continuous and sharp once it begins and he has to stop walking due to the pain. He feels as though the pain is superficial. No pain currently, but at worst 7/10 after walking about 10 minutes along the lateral hip that subsides with rest. He denies any numbness/tingling.   PERTINENT HISTORY: See PMH above   PAIN:  Are you having pain? No  PRECAUTIONS: None  WEIGHT BEARING RESTRICTIONS No  FALLS:  Has patient fallen in last 6 months? No, Number of falls: 0  LIVING ENVIRONMENT: Lives with: lives with their spouse Lives in: House/apartment Stairs: Yes; flight of stairs, rarely uses  Has following equipment at home: None  OCCUPATION: retired   PLOF: Independent  [place] "To be able to walk a decent distance without pain."   OBJECTIVE:   DIAGNOSTIC FINDINGS: N/A  PATIENT SURVEYS:  FOTO 72% function and predicted  COGNITION:  Overall cognitive status: Within functional limits for tasks assessed     SENSATION:  Light touch: Appears intact   MUSCLE LENGTH: Hamstring 90/90: Right lacking 50 deg; Left lacking 35 deg Thomas test: (+) RLE for iliopsoas and rectus femoris.  Piriformis: moderate tightness bilaterally   POSTURE:  WNL  PALPATION: TTP Rt glute med/max and piriformis.  Symmetrical pelvic alignment in supine and standing.   LE AROM/PROM:  Rt hip P/AROM WNL, pain at end range of flexion, ER, and IR. Lumbar AROM WNL with patient reporting low back pain will all planes of movement, though no hip pain reported.   LE MMT:  MMT Right 05/24/2021 Left 05/24/2021  Hip flexion 4+ 5  Hip extension 4- (pain) 4-  Hip abduction 4-(pain) 4+  Hip adduction 5 5  Hip internal rotation 5 5  Hip external rotation 5 5   (Blank rows = not tested)  LOWER EXTREMITY SPECIAL TESTS:  Ober's: (+) RLE FABER: (+) RLE FADIR: (+ for pain) RLE Scour: (-)  Hinckley Sit:  (-)  SLR: (-)   FUNCTIONAL TESTS:  SLS: 3 sec RLE, 10 sec LLE; trendelenburg sign bilaterally  Double Leg Squat: bilateral foot ER, excessive anterior tibial translation   GAIT: Distance walked: 20 ft Assistive device utilized: None Level of assistance: Complete Independence Comments: WBOS, trendelenburg sign     TODAY'S TREATMENT: TREATMENT 05/24/21  Therapeutic Exercise: - demonstrated and issued HEP.   Manual Therapy: - N/A  Neuromuscular re-ed: - N/A  Therapeutic Activity: - Education on assessment findings and POC.   Self-care/Home Management: - N/A    PATIENT EDUCATION:  Education details: See treatment above Person educated: Patient and Spouse Education method: Explanation, Demonstration, Tactile cues, Verbal cues, and Handouts Education comprehension: verbalized understanding, returned demonstration, verbal cues required, tactile cues required, and needs further education   HOME EXERCISE PROGRAM: Access Code: [email or web address]  ASSESSMENT:  CLINICAL IMPRESSION: Patient is a 71 y.o. male who was seen today for physical therapy evaluation  and treatment for chronic Rt lateral hip pain that has been on/off ever since sustaining a lateral hip contusion 10 years ago. He reports onset of Rt lateral hip pain localized to the greater trochanter after 10 minutes of walking described as sharp and superficial. Upon assessment he is noted to have end range pain about the lateral hip with active and passive hip flexion, IR, and ER. He has palpable tenderness about lateral gluteal musculature and significant tightness about hip musculature. He is noted to have gluteal weakness with trendelenburg sign present during SLS bilaterally. He will benefit from skilled PT to address the above stated deficits in order to optimize his function.   REHAB POTENTIAL: Good  CLINICAL DECISION MAKING: Stable/uncomplicated  EVALUATION COMPLEXITY: Low   GOALS:  SHORT TERM GOALS:  STG Name  Target Date Goal status  1 Patient will be independent and compliant with initial HEP.   Baseline: issued at eval 06/07/2021 INITIAL  2 Patient will improve Rt hamstring length by 10 degrees to reduce overall stress on his hip with recreational activity.  Baseline: lacking 50  06/14/2021 INITIAL   [place] TERM GOALS:   LTG Name Target Date Goal status  1 Patient will maintain SLS on the RLE for at least 5 seconds without hip drop to improve stability with community ambulation.  Baseline: 07/05/2021 INITIAL  2 Patient will demonstrate at least 4+/5 pain free Rt hip abductor and extensor strength to improve stability about the chain with prolonged walking and standing.  Baseline: 07/05/2021 INITIAL  3 Patient will tolerate at least 30 minutes of walking with </=2/10 hip pain Baseline:10 minutes, pain 7/10.  07/05/2021 INITIAL   PLAN: PT FREQUENCY: 2x/week  PT DURATION: 6 weeks  PLANNED INTERVENTIONS: Therapeutic exercises, Therapeutic activity, Neuro Muscular re-education, Balance training, Gait training, Patient/Family education, Joint mobilization, Stair training, Dry Needling, Electrical stimulation, Cryotherapy, Moist heat, Taping, Ultrasound, Ionotophoresis 4mg /ml Dexamethasone, and Manual therapy  PLAN FOR NEXT SESSION: review HEP, review FOTO, hip strengthening, hip stretching.   Gwendolyn Grant, PT, DPT, ATC 05/24/21 12:55 PM

## 2021-05-24 ENCOUNTER — Other Ambulatory Visit: Payer: Self-pay

## 2021-05-24 ENCOUNTER — Ambulatory Visit: Payer: PPO | Attending: Internal Medicine

## 2021-05-24 DIAGNOSIS — M25651 Stiffness of right hip, not elsewhere classified: Secondary | ICD-10-CM | POA: Insufficient documentation

## 2021-05-24 DIAGNOSIS — M25551 Pain in right hip: Secondary | ICD-10-CM | POA: Diagnosis not present

## 2021-05-24 DIAGNOSIS — R262 Difficulty in walking, not elsewhere classified: Secondary | ICD-10-CM | POA: Diagnosis not present

## 2021-05-24 DIAGNOSIS — M7630 Iliotibial band syndrome, unspecified leg: Secondary | ICD-10-CM | POA: Diagnosis not present

## 2021-05-26 NOTE — Therapy (Signed)
OUTPATIENT PHYSICAL THERAPY TREATMENT NOTE   Patient Name: TAIMUR FIER MRN: [id number] DOB:25-Dec-1950, 71 y.o., male Today's Date: 05/29/2021  PCP: Biagio Borg, MD REFERRING PROVIDER: Biagio Borg, MD   PT End of Session - 05/29/21 0931     Visit Number 2    Number of Visits 13    Date for PT Re-Evaluation 07/08/21    Authorization Type [employer]    PT Start Time 0931    PT Stop Time 1013    PT Time Calculation (min) 42 min    Activity Tolerance Patient tolerated treatment well    Behavior During Therapy [facility] for tasks assessed/performed             Past Medical History:  Diagnosis Date   Acute prostatitis 04/04/2007   ADD 01/17/2007   Anxiety    DEPRESSION 01/17/2007   Treatment Resident Depression   Dermatophytosis of groin and perianal area 05/31/2008   DIVERTICULOSIS, COLON 04/06/2007   Dysuria 01/27/2009   FLANK PAIN, LEFT 09/30/2007   Flushing 08/26/2009   GLUCOSE INTOLERANCE 04/04/2007   Gross hematuria 01/27/2009   HEMATOCHEZIA 07/26/2008   HYPERLIPIDEMIA 01/20/2007   HYPOGONADISM 06/13/2009   Impaired glucose tolerance 12/10/2010   INSOMNIA 04/04/2007   LIBIDO, DECREASED 10/16/4130   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   LOW BACK PAIN 01/20/2007   OBSTRUCTIVE SLEEP APNEA 04/04/2007   OTITIS MEDIA, ACUTE, BILATERAL 06/13/2009   PANCREATITIS, HX OF 01/17/2007   PSA, INCREASED 11/18/2009   RASH-NONVESICULAR 08/26/2009   SINUSITIS- ACUTE-NOS 04/04/2007   SWEATING 09/30/2007   THRUSH 04/04/2007   TONSILLECTOMY AND ADENOIDECTOMY, HX OF 01/17/2007   URI 07/25/2010   URINARY RETENTION 01/27/2009   VERTIGO 06/13/2009   Past Surgical History:  Procedure Laterality Date   CHOLECYSTECTOMY  2005   HEMORRHOID BANDING  2015   HERNIA REPAIR  1957   INGUINAL HERNIA REPAIR Right 07/27/2020   Procedure: RIGHT INGUINAL HERNIA REPAIR WITH MESH;  Surgeon: Erroll Luna, MD;  Location: [place];  Service: General;  Laterality: Right;   [place]   Patient  Active Problem List   Diagnosis Date Noted   Right inguinal hernia 06/09/2020   Vitamin D deficiency 07/25/2019   Elevated PSA 07/23/2019   Abnormal urinalysis 07/23/2019   Weight loss 07/19/2017   Decrease in appetite 07/19/2017   Anxiety 09/11/2016   Microalbuminuria 03/13/2016   Allergic rhinitis 09/13/2015   Allergic conjunctivitis 09/13/2015   B12 deficiency 08/14/2013   Internal hemorrhoids with other complication 44/05/270   Fecal incontinence 11/12/2012   Dyspepsia and other specified disorders of function of stomach 11/12/2012   Right hip pain 06/11/2012   Dizziness 11/19/2011   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[place]) 53/66/4403   Encounter for well adult exam with abnormal findings 09/10/2010   PSA, INCREASED 11/18/2009   HYPOGONADISM 06/13/2009   Gross hematuria 01/27/2009   URINARY RETENTION 01/27/2009   LIBIDO, DECREASED 12/02/2008   Dermatophytosis of groin and perianal area 47/42/5956   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   Diverticulosis of large intestine 04/06/2007   OBSTRUCTIVE SLEEP APNEA 04/04/2007   INSOMNIA 04/04/2007   Hyperlipidemia 01/20/2007   GERD 01/20/2007   LOW BACK PAIN 01/20/2007   Depression 01/17/2007   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   PAIN:  Are you having pain? Yes VAS scale: 7/10 Pain location: low back  Pain orientation: Lower  PAIN TYPE: aching Pain description: intermittent  Aggravating factors: unknown Relieving factors: movement      OBJECTIVE:   Unless otherwise noted all objective measures were  obtained on initial evaluation.  DIAGNOSTIC FINDINGS: N/A   PATIENT SURVEYS:  FOTO 72% function and predicted   COGNITION:          Overall cognitive status: Within functional limits for tasks assessed                        SENSATION:          Light touch: Appears intact           MUSCLE LENGTH: Hamstring 90/90: Right lacking 50 deg; Left lacking 35 deg Thomas test: (+) RLE for iliopsoas and rectus femoris.  Piriformis: moderate tightness bilaterally    POSTURE:  WNL   PALPATION: TTP Rt glute med/max and piriformis.  Symmetrical pelvic alignment in supine and standing.    LE AROM/PROM:  Rt hip P/AROM WNL, pain at end range of flexion, ER, and IR. Lumbar AROM WNL with patient reporting low back pain will all planes of movement, though no hip pain reported.    LE MMT:   MMT Right 05/24/2021 Left 05/24/2021  Hip flexion 4+ 5  Hip extension 4- (pain) 4-  Hip abduction 4-(pain) 4+  Hip adduction 5 5  Hip internal rotation 5 5  Hip external rotation 5 5   (Blank rows = not tested)   LOWER EXTREMITY SPECIAL TESTS:  Ober's: (+) RLE FABER: (+) RLE FADIR: (+ for pain) RLE Scour: (-)  Briddell Sit: (-)  SLR: (-)    FUNCTIONAL TESTS:  SLS: 3 sec RLE, 10 sec LLE; trendelenburg sign bilaterally  Double Leg Squat: bilateral foot ER, excessive anterior tibial translation     GAIT: Distance walked: 20 ft Assistive device utilized: None Level of assistance: Complete Independence Comments: WBOS, trendelenburg sign        TODAY'S TREATMENT: [facility] Adult PT Treatment:                                                DATE: 05/29/21 Therapeutic Exercise: Nustep LE only level 6 x 5 minutes  Figure 4 stretch 30 sec each  Single knee to chest 30 sec each  Supine hip flexor stretch 30 sec each  Supine hamstring stretch 30 sec each IT band stretch 30 sec each  Hip bridge 2 x 10  Clamshells 2 x 10 Updated HEP Manual Therapy: STM/DTM Rt gluteals, IT band, hamstring Inferior, posterior  and anterior Rt hip mobilization grade II-III   TREATMENT 05/24/21 Therapeutic Exercise: - demonstrated and issued HEP.    Manual Therapy: - N/A   Neuromuscular re-ed: - N/A   Therapeutic Activity: - Education on assessment findings and POC.    Self-care/Home Management: - N/A       PATIENT EDUCATION:  Education details: See treatment above Person educated: Patient and Spouse Education method: Explanation, Demonstration, Tactile cues, Verbal cues, and Handouts Education comprehension: verbalized understanding, returned demonstration, verbal cues required, tactile cues  required, and needs further education     HOME EXERCISE PROGRAM: Access Code: [email or web address]   ASSESSMENT:   CLINICAL IMPRESSION:  Patient tolerated session well today with initial review of HEP, which he demonstrates independence in completion having met this short term goal. He quickly fatigues with aerobic activity on the Nustep at beginning of session, though no onset of hip pain. Able to introduce gluteal strengthening without reports of hip pain, though he has difficulty maintaining lumbopelvic stability. Mild tightness and palpable tenderness about glute med present.    REHAB POTENTIAL: Good   CLINICAL DECISION MAKING: Stable/uncomplicated   EVALUATION COMPLEXITY: Low     GOALS:   SHORT TERM GOALS:   STG Name Target Date Goal status  1 Patient will be independent and compliant with initial HEP.    Baseline: issued at eval 06/07/2021 Achieved   2 Patient will improve Rt hamstring length by 10 degrees to reduce overall stress on his hip with recreational activity.  Baseline: lacking 50  06/14/2021 INITIAL    [place] TERM GOALS:    LTG Name Target Date Goal status  1 Patient will maintain SLS on the RLE for at least 5 seconds without hip drop to improve stability with community ambulation.  Baseline: 07/05/2021 INITIAL  2 Patient will demonstrate at least 4+/5 pain free Rt hip abductor and extensor  strength to improve stability about the chain with prolonged walking and standing.  Baseline: 07/05/2021 INITIAL  3 Patient will tolerate at least 30 minutes of walking with </=2/10 hip pain Baseline:10 minutes, pain 7/10.  07/05/2021 INITIAL    PLAN: PT FREQUENCY: 2x/week   PT DURATION: 6 weeks   PLANNED INTERVENTIONS: Therapeutic exercises, Therapeutic activity, Neuro Muscular re-education, Balance training, Gait training, Patient/Family education, Joint mobilization, Stair training, Dry Needling, Electrical stimulation, Cryotherapy, Moist heat, Taping, Ultrasound, Ionotophoresis 47m/ml Dexamethasone, and Manual therapy   PLAN FOR NEXT SESSION:  review FOTO, hip strengthening, hip stretching.  SGwendolyn Grant PT, DPT, ATC 05/29/21 10:15 AM

## 2021-05-29 ENCOUNTER — Other Ambulatory Visit: Payer: Self-pay

## 2021-05-29 ENCOUNTER — Ambulatory Visit: Payer: PPO

## 2021-05-29 DIAGNOSIS — R262 Difficulty in walking, not elsewhere classified: Secondary | ICD-10-CM

## 2021-05-29 DIAGNOSIS — M25551 Pain in right hip: Secondary | ICD-10-CM | POA: Diagnosis not present

## 2021-05-29 DIAGNOSIS — M25651 Stiffness of right hip, not elsewhere classified: Secondary | ICD-10-CM

## 2021-05-30 NOTE — Therapy (Signed)
OUTPATIENT PHYSICAL THERAPY TREATMENT NOTE   Patient Name: Paul Tucker MRN: [id number] DOB:1951-05-14, 71 y.o., male Today's Date: 06/01/2021  PCP: Biagio Borg, MD REFERRING PROVIDER: Biagio Borg, MD   PT End of Session - 06/01/21 1306     Visit Number 3    Number of Visits 13    Date for PT Re-Evaluation 07/08/21    Authorization Type [employer]    PT Start Time 1314    PT Stop Time 1400    PT Time Calculation (min) 46 min    Activity Tolerance Patient tolerated treatment well    Behavior During Therapy [facility] for tasks assessed/performed              Past Medical History:  Diagnosis Date   Acute prostatitis 04/04/2007   ADD 01/17/2007   Anxiety    DEPRESSION 01/17/2007   Treatment Resident Depression   Dermatophytosis of groin and perianal area 05/31/2008   DIVERTICULOSIS, COLON 04/06/2007   Dysuria 01/27/2009   FLANK PAIN, LEFT 09/30/2007   Flushing 08/26/2009   GLUCOSE INTOLERANCE 04/04/2007   Gross hematuria 01/27/2009   HEMATOCHEZIA 07/26/2008   HYPERLIPIDEMIA 01/20/2007   HYPOGONADISM 06/13/2009   Impaired glucose tolerance 12/10/2010   INSOMNIA 04/04/2007   LIBIDO, DECREASED 9/89/2119   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   LOW BACK PAIN 01/20/2007   OBSTRUCTIVE SLEEP APNEA 04/04/2007   OTITIS MEDIA, ACUTE, BILATERAL 06/13/2009   PANCREATITIS, HX OF 01/17/2007   PSA, INCREASED 11/18/2009   RASH-NONVESICULAR 08/26/2009   SINUSITIS- ACUTE-NOS 04/04/2007   SWEATING 09/30/2007   THRUSH 04/04/2007   TONSILLECTOMY AND ADENOIDECTOMY, HX OF 01/17/2007   URI 07/25/2010   URINARY RETENTION 01/27/2009   VERTIGO 06/13/2009   Past Surgical History:  Procedure Laterality Date   CHOLECYSTECTOMY  2005   HEMORRHOID BANDING  2015   HERNIA REPAIR  1957   INGUINAL HERNIA REPAIR Right 07/27/2020   Procedure: RIGHT INGUINAL HERNIA REPAIR WITH MESH;  Surgeon: Erroll Luna, MD;  Location: [place];  Service: General;  Laterality: Right;   [place]    Patient Active Problem List   Diagnosis Date Noted   Right inguinal hernia 06/09/2020   Vitamin D deficiency 07/25/2019   Elevated PSA 07/23/2019   Abnormal urinalysis 07/23/2019   Weight loss 07/19/2017   Decrease in appetite 07/19/2017   Anxiety 09/11/2016   Microalbuminuria 03/13/2016   Allergic rhinitis 09/13/2015   Allergic conjunctivitis 09/13/2015   B12 deficiency 08/14/2013   Internal hemorrhoids with other complication 41/74/0814   Fecal incontinence 11/12/2012   Dyspepsia and other specified disorders of function of stomach 11/12/2012   Right hip pain 06/11/2012   Dizziness 11/19/2011   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[place]) 48/18/5631   Encounter for well adult exam with abnormal findings 09/10/2010   PSA, INCREASED 11/18/2009   HYPOGONADISM 06/13/2009   Gross hematuria 01/27/2009   URINARY RETENTION 01/27/2009   LIBIDO, DECREASED 12/02/2008   Dermatophytosis of groin and perianal area 49/70/2637   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   Diverticulosis of large intestine 04/06/2007   OBSTRUCTIVE SLEEP APNEA 04/04/2007   INSOMNIA 04/04/2007   Hyperlipidemia 01/20/2007   GERD 01/20/2007   LOW BACK PAIN 01/20/2007   Depression 01/17/2007   Attention deficit disorder 01/17/2007   PANCREATITIS, HX OF 01/17/2007   TONSILLECTOMY AND ADENOIDECTOMY, HX OF 01/17/2007    REFERRING DIAG: M76.30 (ICD-10-CM) - Iliotibial band syndrome, unspecified laterality   THERAPY DIAG:  Pain in right hip  Difficulty  in walking, not elsewhere classified  Stiffness of right hip, not elsewhere classified  PERTINENT HISTORY: N/A  PRECAUTIONS: None  SUBJECTIVE: Patient reports the hip is feeling ok today without reports of pain. Still having difficulty utilizing the [place] app appropriately. He was able to walk outside for less than 5 minutes since last session with minimal hip pain.   PAIN:  Are you having pain? No VAS scale: 0/10 Pain location: N/A Pain orientation:  N/A PAIN TYPE: N/A Pain description: N/A Aggravating factors: N/A Relieving factors: N/A      OBJECTIVE:   Unless otherwise noted all objective measures were obtained on initial evaluation.  DIAGNOSTIC FINDINGS: N/A   PATIENT SURVEYS:  FOTO 72% function and predicted   COGNITION:          Overall cognitive status: Within functional limits for tasks assessed                        SENSATION:          Light touch: Appears intact           MUSCLE LENGTH: Hamstring 90/90: Right lacking 50 deg; Left lacking 35 deg; 06/01/21 Right lacking 41, Left lacking 26  Thomas test: (+) RLE for iliopsoas and rectus femoris.  Piriformis: moderate tightness bilaterally    POSTURE:  WNL   PALPATION: TTP Rt glute med/max and piriformis.  Symmetrical pelvic alignment in supine and standing.    LE AROM/PROM:  Rt hip P/AROM WNL, pain at end range of flexion, ER, and IR. Lumbar AROM WNL with patient reporting low back pain will all planes of movement, though no hip pain reported.    LE MMT:   MMT Right 05/24/2021 Left 05/24/2021  Hip flexion 4+ 5  Hip extension 4- (pain) 4-  Hip abduction 4-(pain) 4+  Hip adduction 5 5  Hip internal rotation 5 5  Hip external rotation 5 5   (Blank rows = not tested)   LOWER EXTREMITY SPECIAL TESTS:  Ober's: (+) RLE FABER: (+) RLE FADIR: (+ for pain) RLE Scour: (-)  [place]ard Sit: (-)  SLR: (-)    FUNCTIONAL TESTS:  SLS: 3 sec RLE, 10 sec LLE; trendelenburg sign bilaterally  Double Leg Squat: bilateral foot ER, excessive anterior tibial translation     GAIT: Distance walked: 20 ft Assistive device utilized: None Level of assistance: Complete Independence Comments: WBOS, trendelenburg sign        TODAY'S TREATMENT: [facility] Adult PT Treatment:                                                DATE: 06/01/21 Therapeutic Exercise: Hip bridge with adduction isometric 2 x 10 Clamshells blue band 2 x 10 bilateral Resisted hip extension in standing green band  2 x 10  Semi-tandem 30 sec each Tandem 2 x 30 sec each  Reviewed and updated HEP and determined utilization of logging daily exercise within [employer] app.   Self Care:  Recommendation on where to purchase stretch strap   [facility] Adult PT Treatment:                                                DATE: 05/29/21 Therapeutic Exercise:  Nustep LE only level 6 x 5 minutes  Figure 4 stretch 30 sec each  Single knee to chest 30 sec each  Supine hip flexor stretch 30 sec each  Supine hamstring stretch 30 sec each IT band stretch 30 sec each  Hip bridge 2 x 10  Clamshells 2 x 10 Updated HEP Manual Therapy: STM/DTM Rt gluteals, IT band, hamstring Inferior, posterior and anterior Rt hip mobilization grade II-III         PATIENT EDUCATION:  Education details: See treatment above Person educated: Patient  Education method: Explanation, Demonstration, Tactile cues, Verbal cues, and Handouts Education comprehension: verbalized understanding, returned demonstration, verbal cues required, tactile cues required     HOME EXERCISE PROGRAM: Access Code: [email or web address]   ASSESSMENT:   CLINICAL IMPRESSION:  Patient tolerated session well today focusing on progression of gluteal strengthening. He is demonstrating improved lumbopelvic stability with mat strengthening, though has difficulty maintaining proper lumbopelvic stabilization with standing strengthening especially during single leg stance on the RLE. He is challenged with tandem balance with moderate sway and occasional LOB that required reaching strategy to maintain balance. His hamstring flexibility has improved compared to baseline nearing his STG, though remains tight bilaterally.    REHAB POTENTIAL: Good   CLINICAL DECISION MAKING: Stable/uncomplicated   EVALUATION COMPLEXITY: Low     GOALS:   SHORT TERM GOALS:   STG Name Target Date Goal status  1 Patient will be independent and compliant with initial HEP.    Baseline: issued at  eval 06/07/2021 Achieved   2 Patient will improve Rt hamstring length by 10 degrees to reduce overall stress on his hip with recreational activity.  Baseline: lacking 50  06/14/2021 ONGOING    [place] TERM GOALS:    LTG Name Target Date Goal status  1 Patient will maintain SLS on the RLE for at least 5 seconds without hip drop to improve stability with community ambulation.  Baseline: 07/05/2021 INITIAL  2 Patient will demonstrate at least 4+/5 pain free Rt hip abductor and extensor strength to improve stability about the chain with prolonged walking and standing.  Baseline: 07/05/2021 INITIAL  3 Patient will tolerate at least 30 minutes of walking with </=2/10 hip pain Baseline:10 minutes, pain 7/10.  07/05/2021 INITIAL    PLAN: PT FREQUENCY: 2x/week   PT DURATION: 6 weeks   PLANNED INTERVENTIONS: Therapeutic exercises, Therapeutic activity, Neuro Muscular re-education, Balance training, Gait training, Patient/Family education, Joint mobilization, Stair training, Dry Needling, Electrical stimulation, Cryotherapy, Moist heat, Taping, Ultrasound, Ionotophoresis 4mg /ml Dexamethasone, and Manual therapy   PLAN FOR NEXT SESSION:  hip strengthening, hip stretching.   Gwendolyn Grant, PT, DPT, ATC 06/01/21 2:06 PM

## 2021-06-01 ENCOUNTER — Other Ambulatory Visit: Payer: Self-pay

## 2021-06-01 ENCOUNTER — Ambulatory Visit: Payer: PPO

## 2021-06-01 DIAGNOSIS — M25551 Pain in right hip: Secondary | ICD-10-CM

## 2021-06-01 DIAGNOSIS — R262 Difficulty in walking, not elsewhere classified: Secondary | ICD-10-CM

## 2021-06-01 DIAGNOSIS — M25651 Stiffness of right hip, not elsewhere classified: Secondary | ICD-10-CM

## 2021-06-05 ENCOUNTER — Other Ambulatory Visit: Payer: Self-pay

## 2021-06-05 ENCOUNTER — Ambulatory Visit: Payer: PPO

## 2021-06-05 DIAGNOSIS — M25551 Pain in right hip: Secondary | ICD-10-CM

## 2021-06-05 DIAGNOSIS — M25651 Stiffness of right hip, not elsewhere classified: Secondary | ICD-10-CM

## 2021-06-05 DIAGNOSIS — R262 Difficulty in walking, not elsewhere classified: Secondary | ICD-10-CM

## 2021-06-05 NOTE — Therapy (Signed)
OUTPATIENT PHYSICAL THERAPY TREATMENT NOTE   Patient Name: Paul Tucker MRN: [id number] DOB:01-25-1951, 71 y.o., male Today's Date: 06/05/2021  PCP: Biagio Borg, MD REFERRING PROVIDER: Biagio Borg, MD   PT End of Session - 06/05/21 1322     Visit Number 4    Number of Visits 13    Date for PT Re-Evaluation 07/08/21    Authorization Type [employer]    PT Start Time 1330    PT Stop Time 1415    PT Time Calculation (min) 45 min    Activity Tolerance Patient tolerated treatment well    Behavior During Therapy [facility] for tasks assessed/performed               Past Medical History:  Diagnosis Date   Acute prostatitis 04/04/2007   ADD 01/17/2007   Anxiety    DEPRESSION 01/17/2007   Treatment Resident Depression   Dermatophytosis of groin and perianal area 05/31/2008   DIVERTICULOSIS, COLON 04/06/2007   Dysuria 01/27/2009   FLANK PAIN, LEFT 09/30/2007   Flushing 08/26/2009   GLUCOSE INTOLERANCE 04/04/2007   Gross hematuria 01/27/2009   HEMATOCHEZIA 07/26/2008   HYPERLIPIDEMIA 01/20/2007   HYPOGONADISM 06/13/2009   Impaired glucose tolerance 12/10/2010   INSOMNIA 04/04/2007   LIBIDO, DECREASED 9/32/6712   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   LOW BACK PAIN 01/20/2007   OBSTRUCTIVE SLEEP APNEA 04/04/2007   OTITIS MEDIA, ACUTE, BILATERAL 06/13/2009   PANCREATITIS, HX OF 01/17/2007   PSA, INCREASED 11/18/2009   RASH-NONVESICULAR 08/26/2009   SINUSITIS- ACUTE-NOS 04/04/2007   SWEATING 09/30/2007   THRUSH 04/04/2007   TONSILLECTOMY AND ADENOIDECTOMY, HX OF 01/17/2007   URI 07/25/2010   URINARY RETENTION 01/27/2009   VERTIGO 06/13/2009   Past Surgical History:  Procedure Laterality Date   CHOLECYSTECTOMY  2005   HEMORRHOID BANDING  2015   HERNIA REPAIR  1957   INGUINAL HERNIA REPAIR Right 07/27/2020   Procedure: RIGHT INGUINAL HERNIA REPAIR WITH MESH;  Surgeon: Erroll Luna, MD;  Location: [place];  Service: General;  Laterality: Right;   [place]    Patient Active Problem List   Diagnosis Date Noted   Right inguinal hernia 06/09/2020   Vitamin D deficiency 07/25/2019   Elevated PSA 07/23/2019   Abnormal urinalysis 07/23/2019   Weight loss 07/19/2017   Decrease in appetite 07/19/2017   Anxiety 09/11/2016   Microalbuminuria 03/13/2016   Allergic rhinitis 09/13/2015   Allergic conjunctivitis 09/13/2015   B12 deficiency 08/14/2013   Internal hemorrhoids with other complication 45/80/9983   Fecal incontinence 11/12/2012   Dyspepsia and other specified disorders of function of stomach 11/12/2012   Right hip pain 06/11/2012   Dizziness 11/19/2011   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[place]) 38/25/0539   Encounter for well adult exam with abnormal findings 09/10/2010   PSA, INCREASED 11/18/2009   HYPOGONADISM 06/13/2009   Gross hematuria 01/27/2009   URINARY RETENTION 01/27/2009   LIBIDO, DECREASED 12/02/2008   Dermatophytosis of groin and perianal area 76/73/4193   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   Diverticulosis of large intestine 04/06/2007   OBSTRUCTIVE SLEEP APNEA 04/04/2007   INSOMNIA 04/04/2007   Hyperlipidemia 01/20/2007   GERD 01/20/2007   LOW BACK PAIN 01/20/2007   Depression 01/17/2007   Attention deficit disorder 01/17/2007   PANCREATITIS, HX OF 01/17/2007   TONSILLECTOMY AND ADENOIDECTOMY, HX OF 01/17/2007    REFERRING DIAG: M76.30 (ICD-10-CM) - Iliotibial band syndrome, unspecified laterality   THERAPY DIAG:  Pain in right hip  Difficulty in walking, not elsewhere classified  Stiffness of right hip, not elsewhere classified  PERTINENT HISTORY: N/A  PRECAUTIONS: None  SUBJECTIVE: Patient reports the hip is doing ok right now without pain. Has done a little bit of walking in his cul-de-sac and it hasn't bothered his hip that much.   PAIN:  Are you having pain? No VAS scale: 0/10 Pain location: N/A Pain orientation: N/A PAIN TYPE: N/A Pain description: N/A Aggravating factors:  N/A Relieving factors: N/A      OBJECTIVE:   Unless otherwise noted all objective measures were obtained on initial evaluation.  DIAGNOSTIC FINDINGS: N/A   PATIENT SURVEYS:  FOTO 72% function and predicted   COGNITION:          Overall cognitive status: Within functional limits for tasks assessed                        SENSATION:          Light touch: Appears intact           MUSCLE LENGTH: Hamstring 90/90: Right lacking 50 deg; Left lacking 35 deg; 06/01/21 Right lacking 41, Left lacking 26  Thomas test: (+) RLE for iliopsoas and rectus femoris.  Piriformis: moderate tightness bilaterally    POSTURE:  WNL   PALPATION: TTP Rt glute med/max and piriformis.  Symmetrical pelvic alignment in supine and standing.    LE AROM/PROM:  Rt hip P/AROM WNL, pain at end range of flexion, ER, and IR. Lumbar AROM WNL with patient reporting low back pain will all planes of movement, though no hip pain reported.    LE MMT:   MMT Right 05/24/2021 Left 05/24/2021  Hip flexion 4+ 5  Hip extension 4- (pain) 4-  Hip abduction 4-(pain) 4+  Hip adduction 5 5  Hip internal rotation 5 5  Hip external rotation 5 5   (Blank rows = not tested)   LOWER EXTREMITY SPECIAL TESTS:  Ober's: (+) RLE FABER: (+) RLE FADIR: (+ for pain) RLE Scour: (-)  Byrd Sit: (-)  SLR: (-)    FUNCTIONAL TESTS:  SLS: 3 sec RLE, 10 sec LLE; trendelenburg sign bilaterally  Double Leg Squat: bilateral foot ER, excessive anterior tibial translation     GAIT: Distance walked: 20 ft Assistive device utilized: None Level of assistance: Complete Independence Comments: WBOS, trendelenburg sign       [facility] Adult PT Treatment:                                                DATE: 06/05/21 Therapeutic Exercise: NuStep level 5 x 5 minutes  Prone quad stretch 30 seconds RLE Lunge hip flexor stretch 30 seconds RLE Prone hip extension x 5 bilateral Prone glute squeeze x 5  Quadruped leg extension 2  x10  Sidelying hip  abduction 2 x 10 bilateral  Sit to stand 2 x 10  Step up 8 inch 1 x 10 bilateral  Lateral band walk at counter 2 sets d/b green band at shins Standing march 2 x 12 Tandem balance 1 x 30 sec each  Updated HEP with new Access Code: [email or web address] to determine if it would count stretch duration appropriately.    TODAY'S TREATMENT: [facility] Adult PT Treatment:  DATE: 06/01/21 Therapeutic Exercise: Hip bridge with adduction isometric 2 x 10 Clamshells blue band 2 x 10 bilateral Resisted hip extension in standing green band 2 x 10  Semi-tandem 30 sec each Tandem 2 x 30 sec each  Reviewed and updated HEP and determined utilization of logging daily exercise within MedBridge app.   Self Care:  Recommendation on where to purchase stretch strap   [facility] Adult PT Treatment:                                                DATE: 05/29/21 Therapeutic Exercise: Nustep LE only level 6 x 5 minutes  Figure 4 stretch 30 sec each  Single knee to chest 30 sec each  Supine hip flexor stretch 30 sec each  Supine hamstring stretch 30 sec each IT band stretch 30 sec each  Hip bridge 2 x 10  Clamshells 2 x 10 Updated HEP Manual Therapy: STM/DTM Rt gluteals, IT band, hamstring Inferior, posterior and anterior Rt hip mobilization grade II-III         PATIENT EDUCATION:  Education details: See treatment above Person educated: Patient  Education method: Explanation, Demonstration, Tactile cues, Verbal cues, and Handouts Education comprehension: verbalized understanding, returned demonstration, verbal cues required, tactile cues required     HOME EXERCISE PROGRAM: Access Code: [email or web address]   ASSESSMENT:   CLINICAL IMPRESSION:  Session focused on progression of gluteal strengthening, which overall he tolerated well. He has difficulty firing his glute max with prone hip extension having tendency to initiate hip extension with his hamstrings and overactivation of lumbar  extensors reporting discomfort in his low back when he performs. Even with tactile and verbal cues he is unable to properly engage glute max requiring continued emphasis at future sessions. Initial Lt hip shift noted with descent of sit to stand that he is able to correct once cued. Tandem balance has improved since last session with minimal sway and no LOB noted. With standing march he has difficulty maintaining balance when RLE is stance leg. No reports of hip pain throughout session.    REHAB POTENTIAL: Good   CLINICAL DECISION MAKING: Stable/uncomplicated   EVALUATION COMPLEXITY: Low     GOALS:   SHORT TERM GOALS:   STG Name Target Date Goal status  1 Patient will be independent and compliant with initial HEP.    Baseline: issued at eval 06/07/2021 Achieved   2 Patient will improve Rt hamstring length by 10 degrees to reduce overall stress on his hip with recreational activity.  Baseline: lacking 50  06/14/2021 ONGOING    [place] TERM GOALS:    LTG Name Target Date Goal status  1 Patient will maintain SLS on the RLE for at least 5 seconds without hip drop to improve stability with community ambulation.  Baseline: 07/05/2021 INITIAL  2 Patient will demonstrate at least 4+/5 pain free Rt hip abductor and extensor strength to improve stability about the chain with prolonged walking and standing.  Baseline: 07/05/2021 INITIAL  3 Patient will tolerate at least 30 minutes of walking with </=2/10 hip pain Baseline:10 minutes, pain 7/10.  07/05/2021 INITIAL    PLAN: PT FREQUENCY: 2x/week   PT DURATION: 6 weeks   PLANNED INTERVENTIONS: Therapeutic exercises, Therapeutic activity, Neuro Muscular re-education, Balance training, Gait training, Patient/Family education, Joint mobilization, Stair training, Dry Needling, Electrical stimulation, Cryotherapy, Moist heat, Taping, Ultrasound,  Ionotophoresis 4mg /ml Dexamethasone, and Manual therapy   PLAN FOR NEXT SESSION:  hip strengthening, hip  stretching.    Gwendolyn Grant, PT, DPT, ATC 06/05/21 2:19 PM

## 2021-06-07 NOTE — Therapy (Signed)
OUTPATIENT PHYSICAL THERAPY TREATMENT NOTE   Patient Name: Paul Tucker MRN: [id number] DOB:Nov 06, 1950, 71 y.o., male Today's Date: 06/08/2021  PCP: Biagio Borg, MD REFERRING PROVIDER: Biagio Borg, MD   PT End of Session - 06/08/21 0908     Visit Number 5    Number of Visits 13    Date for PT Re-Evaluation 07/08/21    Authorization Type [employer]    PT Start Time 0912    PT Stop Time 0955    PT Time Calculation (min) 43 min    Activity Tolerance Patient tolerated treatment well    Behavior During Therapy [facility] for tasks assessed/performed                Past Medical History:  Diagnosis Date   Acute prostatitis 04/04/2007   ADD 01/17/2007   Anxiety    DEPRESSION 01/17/2007   Treatment Resident Depression   Dermatophytosis of groin and perianal area 05/31/2008   DIVERTICULOSIS, COLON 04/06/2007   Dysuria 01/27/2009   FLANK PAIN, LEFT 09/30/2007   Flushing 08/26/2009   GLUCOSE INTOLERANCE 04/04/2007   Gross hematuria 01/27/2009   HEMATOCHEZIA 07/26/2008   HYPERLIPIDEMIA 01/20/2007   HYPOGONADISM 06/13/2009   Impaired glucose tolerance 12/10/2010   INSOMNIA 04/04/2007   LIBIDO, DECREASED 3/79/0240   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   LOW BACK PAIN 01/20/2007   OBSTRUCTIVE SLEEP APNEA 04/04/2007   OTITIS MEDIA, ACUTE, BILATERAL 06/13/2009   PANCREATITIS, HX OF 01/17/2007   PSA, INCREASED 11/18/2009   RASH-NONVESICULAR 08/26/2009   SINUSITIS- ACUTE-NOS 04/04/2007   SWEATING 09/30/2007   THRUSH 04/04/2007   TONSILLECTOMY AND ADENOIDECTOMY, HX OF 01/17/2007   URI 07/25/2010   URINARY RETENTION 01/27/2009   VERTIGO 06/13/2009   Past Surgical History:  Procedure Laterality Date   CHOLECYSTECTOMY  2005   HEMORRHOID BANDING  2015   HERNIA REPAIR  1957   INGUINAL HERNIA REPAIR Right 07/27/2020   Procedure: RIGHT INGUINAL HERNIA REPAIR WITH MESH;  Surgeon: Erroll Luna, MD;  Location: [place] [facility];  Service: General;  Laterality: Right;   [place]    Patient Active Problem List   Diagnosis Date Noted   Right inguinal hernia 06/09/2020   Vitamin D deficiency 07/25/2019   Elevated PSA 07/23/2019   Abnormal urinalysis 07/23/2019   Weight loss 07/19/2017   Decrease in appetite 07/19/2017   Anxiety 09/11/2016   Microalbuminuria 03/13/2016   Allergic rhinitis 09/13/2015   Allergic conjunctivitis 09/13/2015   B12 deficiency 08/14/2013   Internal hemorrhoids with other complication 97/35/3299   Fecal incontinence 11/12/2012   Dyspepsia and other specified disorders of function of stomach 11/12/2012   Right hip pain 06/11/2012   Dizziness 11/19/2011   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[place]) 24/26/8341   Encounter for well adult exam with abnormal findings 09/10/2010   PSA, INCREASED 11/18/2009   HYPOGONADISM 06/13/2009   Gross hematuria 01/27/2009   URINARY RETENTION 01/27/2009   LIBIDO, DECREASED 12/02/2008   Dermatophytosis of groin and perianal area 96/22/2979   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   Diverticulosis of large intestine 04/06/2007   OBSTRUCTIVE SLEEP APNEA 04/04/2007   INSOMNIA 04/04/2007   Hyperlipidemia 01/20/2007   GERD 01/20/2007   LOW BACK PAIN 01/20/2007   Depression 01/17/2007   Attention deficit disorder 01/17/2007   PANCREATITIS, HX OF 01/17/2007   TONSILLECTOMY AND ADENOIDECTOMY, HX OF 01/17/2007    REFERRING DIAG: M76.30 (ICD-10-CM) - Iliotibial band syndrome, unspecified laterality   THERAPY DIAG:  Pain in right hip  Difficulty in walking, not elsewhere classified  Stiffness of right hip, not elsewhere classified  PERTINENT HISTORY: N/A  PRECAUTIONS: None   SUBJECTIVE: Patient reports hip is feeling fine, denies any pain this morning. Exercises are going pretty good. He has been trying to walk after he exercises, he is walking about 5 minutes. He reports he gets a little discomfort with current distance he is walking, but he may be able to walk a little further before the real pain  comes on.  PAIN:  Are you having pain? No VAS scale: 0/10 Pain location: N/A Pain orientation: N/A PAIN TYPE: N/A Pain description: N/A Aggravating factors: N/A Relieving factors: N/A    OBJECTIVE:  Unless otherwise noted all objective measures were obtained on initial evaluation.  PATIENT SURVEYS:  FOTO 72% function and predicted   MUSCLE LENGTH: Hamstring 90/90: Right lacking 50 deg; Left lacking 35 deg; 06/01/21 Right lacking 41, Left lacking 26  Thomas test: (+) RLE for iliopsoas and rectus femoris.  Piriformis: moderate tightness bilaterally    POSTURE:  WNL   PALPATION: TTP Rt glute med/max and piriformis.  Symmetrical pelvic alignment in supine and standing.    LE AROM/PROM:  Rt hip P/AROM WNL, pain at end range of flexion, ER, and IR. Lumbar AROM WNL with patient reporting low back pain will all planes of movement, though no hip pain reported.    LE MMT:   MMT Right 05/24/2021 Left 05/24/2021  Hip flexion 4+ 5  Hip extension 4- (pain) 4-  Hip abduction 4-(pain) 4+  Hip adduction 5 5  Hip internal rotation 5 5  Hip external rotation 5 5   (Blank rows = not tested)   LOWER EXTREMITY SPECIAL TESTS:  Ober's: (+) RLE FABER: (+) RLE FADIR: (+ for pain) RLE Scour: (-)  [place] Sit: (-)  SLR: (-)    FUNCTIONAL TESTS:  SLS: 3 sec RLE, 10 sec LLE; trendelenburg sign bilaterally  Double Leg Squat: bilateral foot ER, excessive anterior tibial translation  GAIT: Distance walked: 20 ft Assistive device utilized: None Level of assistance: Complete Independence Comments: WBOS, trendelenburg sign     TODAY'S TREATMENT:  06/08/2021: NuStep L6 x 5 min with UE/LE while taking subjective Bridge with blue band at knees 2 x 10 with 5 sec hold Side clamshell with blue 2 x 10 Sit to stand holding 15# at chest 2 x 10 Bent over hip extension with green band at knees 2 x 10 each - tactile cueing to engage gluetals  Runner step-up on 8" box with contralateral UE support 2 x  10 each Tandem stance 3 x 30 sec each Resisted side stepping with FM 10# holding handle at chest 2 x 3 each Row with FM 13# 2 x 10    [facility] Adult PT Treatment:                                                DATE: 06/05/21 Therapeutic Exercise: NuStep level 5 x 5 minutes  Prone quad stretch 30 seconds RLE Lunge hip flexor stretch 30 seconds RLE Prone hip extension x 5 bilateral Prone glute squeeze x 5  Quadruped leg extension 2  x10  Sidelying hip abduction 2 x 10 bilateral  Sit to stand 2 x 10  Step up 8 inch 1 x 10 bilateral  Lateral band walk at counter 2 sets d/b  green band at shins Standing march 2 x 12 Tandem balance 1 x 30 sec each  Updated HEP with new Access Code: [email or web address] to determine if it would count stretch duration appropriately.    [facility] Adult PT Treatment:                                                DATE: 06/01/21 Therapeutic Exercise: Hip bridge with adduction isometric 2 x 10 Clamshells blue band 2 x 10 bilateral Resisted hip extension in standing green band 2 x 10  Semi-tandem 30 sec each Tandem 2 x 30 sec each  Reviewed and updated HEP and determined utilization of logging daily exercise within MedBridge app.  Self Care: Recommendation on where to purchase stretch strap   [facility] Adult PT Treatment:                                                DATE: 05/29/21 Therapeutic Exercise: Nustep LE only level 6 x 5 minutes  Figure 4 stretch 30 sec each  Single knee to chest 30 sec each  Supine hip flexor stretch 30 sec each  Supine hamstring stretch 30 sec each IT band stretch 30 sec each  Hip bridge 2 x 10  Clamshells 2 x 10 Updated HEP Manual Therapy: STM/DTM Rt gluteals, IT band, hamstring Inferior, posterior and anterior Rt hip mobilization grade II-III   PATIENT EDUCATION:  Education details: HEP Person educated: Patient  Education method: Consulting civil engineer, Demonstration, Corporate treasurer cues, Verbal cues, and Handouts Education comprehension: verbalized  understanding, returned demonstration, verbal cues required, tactile cues required  HOME EXERCISE PROGRAM: Access Code: [email or web address]    ASSESSMENT: CLINICAL IMPRESSION:   Patient tolerated therapy well with no adverse effects. Therapy focused on progressing hip and core strengthening with good tolerance. Patient did report increase in right hip discomfort/soreness with therapy but able to complete all prescribed exercises. He did require occasional cueing for proper hip engagement to avoid excessive lumbar activation/overuse. No changes made to HEP this visit. Patient would benefit from continued skilled PT to progress strength in order to reduce pain and maximize functional ability.     GOALS: SHORT TERM GOALS:   STG Name Target Date Goal status  1 Patient will be independent and compliant with initial HEP.    Baseline: issued at eval 06/07/2021 Achieved   2 Patient will improve Rt hamstring length by 10 degrees to reduce overall stress on his hip with recreational activity.  Baseline: lacking 50  06/14/2021 ONGOING    [place] TERM GOALS:    LTG Name Target Date Goal status  1 Patient will maintain SLS on the RLE for at least 5 seconds without hip drop to improve stability with community ambulation.  Baseline: 07/05/2021 INITIAL  2 Patient will demonstrate at least 4+/5 pain free Rt hip abductor and extensor strength to improve stability about the chain with prolonged walking and standing.  Baseline: 07/05/2021 INITIAL  3 Patient will tolerate at least 30 minutes of walking with </=2/10 hip pain Baseline:10 minutes, pain 7/10.  07/05/2021 INITIAL     PLAN: PT FREQUENCY: 2x/week   PT DURATION: 6 weeks   PLANNED INTERVENTIONS: Therapeutic exercises, Therapeutic activity, Neuro Muscular re-education, Balance training, Gait training, Patient/Family education,  Joint mobilization, Stair training, Dry Needling, Electrical stimulation, Cryotherapy, Moist heat, Taping, Ultrasound, Ionotophoresis  4mg /ml Dexamethasone, and Manual therapy   PLAN FOR NEXT SESSION:  hip strengthening, hip stretching.    Hilda Blades, PT, DPT, LAT, ATC 06/08/21  9:56 AM Phone: [phone] Fax: [phone]

## 2021-06-08 ENCOUNTER — Ambulatory Visit: Payer: PPO | Admitting: Physical Therapy

## 2021-06-08 ENCOUNTER — Encounter: Payer: Self-pay | Admitting: Physical Therapy

## 2021-06-08 ENCOUNTER — Other Ambulatory Visit: Payer: Self-pay

## 2021-06-08 DIAGNOSIS — M25551 Pain in right hip: Secondary | ICD-10-CM | POA: Diagnosis not present

## 2021-06-08 DIAGNOSIS — M25651 Stiffness of right hip, not elsewhere classified: Secondary | ICD-10-CM

## 2021-06-08 DIAGNOSIS — R262 Difficulty in walking, not elsewhere classified: Secondary | ICD-10-CM

## 2021-06-12 ENCOUNTER — Ambulatory Visit: Payer: PPO

## 2021-06-12 ENCOUNTER — Other Ambulatory Visit: Payer: Self-pay

## 2021-06-12 DIAGNOSIS — M25651 Stiffness of right hip, not elsewhere classified: Secondary | ICD-10-CM

## 2021-06-12 DIAGNOSIS — R262 Difficulty in walking, not elsewhere classified: Secondary | ICD-10-CM

## 2021-06-12 DIAGNOSIS — M25551 Pain in right hip: Secondary | ICD-10-CM | POA: Diagnosis not present

## 2021-06-12 NOTE — Therapy (Signed)
OUTPATIENT PHYSICAL THERAPY TREATMENT NOTE   Patient Name: Paul Tucker MRN: [id number] DOB:[birth year], 71 y.o., male Today's Date: 06/12/2021  PCP: Biagio Borg, MD REFERRING PROVIDER: Biagio Borg, MD   PT End of Session - 06/12/21 0930     Visit Number 6    Number of Visits 13    Date for PT Re-Evaluation 07/08/21    Authorization Type [employer]    PT Start Time 0930    PT Stop Time 1014    PT Time Calculation (min) 44 min    Activity Tolerance Patient tolerated treatment well    Behavior During Therapy [facility] for tasks assessed/performed                 Past Medical History:  Diagnosis Date   Acute prostatitis 04/04/2007   ADD 01/17/2007   Anxiety    DEPRESSION 01/17/2007   Treatment Resident Depression   Dermatophytosis of groin and perianal area 05/31/2008   DIVERTICULOSIS, COLON 04/06/2007   Dysuria 01/27/2009   FLANK PAIN, LEFT 09/30/2007   Flushing 08/26/2009   GLUCOSE INTOLERANCE 04/04/2007   Gross hematuria 01/27/2009   HEMATOCHEZIA 07/26/2008   HYPERLIPIDEMIA 01/20/2007   HYPOGONADISM 06/13/2009   Impaired glucose tolerance 12/10/2010   INSOMNIA 04/04/2007   LIBIDO, DECREASED 4/65/6812   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   LOW BACK PAIN 01/20/2007   OBSTRUCTIVE SLEEP APNEA 04/04/2007   OTITIS MEDIA, ACUTE, BILATERAL 06/13/2009   PANCREATITIS, HX OF 01/17/2007   PSA, INCREASED 11/18/2009   RASH-NONVESICULAR 08/26/2009   SINUSITIS- ACUTE-NOS 04/04/2007   SWEATING 09/30/2007   THRUSH 04/04/2007   TONSILLECTOMY AND ADENOIDECTOMY, HX OF 01/17/2007   URI 07/25/2010   URINARY RETENTION 01/27/2009   VERTIGO 06/13/2009   Past Surgical History:  Procedure Laterality Date   CHOLECYSTECTOMY  2005   HEMORRHOID BANDING  2015   HERNIA REPAIR  1957   INGUINAL HERNIA REPAIR Right 07/27/2020   Procedure: RIGHT INGUINAL HERNIA REPAIR WITH MESH;  Surgeon: Erroll Luna, MD;  Location: [place];  Service: General;  Laterality: Right;   [place]    Patient Active Problem List   Diagnosis Date Noted   Right inguinal hernia 06/09/2020   Vitamin D deficiency 07/25/2019   Elevated PSA 07/23/2019   Abnormal urinalysis 07/23/2019   Weight loss 07/19/2017   Decrease in appetite 07/19/2017   Anxiety 09/11/2016   Microalbuminuria 03/13/2016   Allergic rhinitis 09/13/2015   Allergic conjunctivitis 09/13/2015   B12 deficiency 08/14/2013   Internal hemorrhoids with other complication 75/17/0017   Fecal incontinence 11/12/2012   Dyspepsia and other specified disorders of function of stomach 11/12/2012   Right hip pain 06/11/2012   Dizziness 11/19/2011   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[place]) 49/44/9675   Encounter for well adult exam with abnormal findings 09/10/2010   PSA, INCREASED 11/18/2009   HYPOGONADISM 06/13/2009   Gross hematuria 01/27/2009   URINARY RETENTION 01/27/2009   LIBIDO, DECREASED 12/02/2008   Dermatophytosis of groin and perianal area 91/63/8466   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   Diverticulosis of large intestine 04/06/2007   OBSTRUCTIVE SLEEP APNEA 04/04/2007   INSOMNIA 04/04/2007   Hyperlipidemia 01/20/2007   GERD 01/20/2007   LOW BACK PAIN 01/20/2007   Depression 01/17/2007   Attention deficit disorder 01/17/2007   PANCREATITIS, HX OF 01/17/2007   TONSILLECTOMY AND ADENOIDECTOMY, HX OF 01/17/2007    REFERRING DIAG: M76.30 (ICD-10-CM) - Iliotibial band syndrome, unspecified laterality   THERAPY DIAG:  Pain in right  hip  Difficulty in walking, not elsewhere classified  Stiffness of right hip, not elsewhere classified  PERTINENT HISTORY: N/A  PRECAUTIONS: None   SUBJECTIVE: Patient reports the hip is feeling fine, but the exercises are causing an ache at the tailbone every time he does them. He has increased his walking distance a little bit and has noticed a reduction in the hip pain, but it is still there.   PAIN:  Are you having pain? Yes VAS scale: 3/10 Pain location: mid/lower  back  PAIN TYPE: intermittent  Pain description: sore   Aggravating factors: unknown  Relieving factors: advil      OBJECTIVE:  Unless otherwise noted all objective measures were obtained on initial evaluation.  PATIENT SURVEYS:  FOTO 72% function and predicted 06/12/21: 63% function    MUSCLE LENGTH: Hamstring 90/90: Right lacking 50 deg; Left lacking 35 deg; 06/01/21 Right lacking 41, Left lacking 26  Thomas test: (+) RLE for iliopsoas and rectus femoris.  Piriformis: moderate tightness bilaterally    POSTURE:  WNL   PALPATION: TTP Rt glute med/max and piriformis.  Symmetrical pelvic alignment in supine and standing.    LE AROM/PROM:  Rt hip P/AROM WNL, pain at end range of flexion, ER, and IR. Lumbar AROM WNL with patient reporting low back pain will all planes of movement, though no hip pain reported.    LE MMT:   MMT Right 05/24/2021 Left 05/24/2021  Hip flexion 4+ 5  Hip extension 4- (pain) 4-  Hip abduction 4-(pain) 4+  Hip adduction 5 5  Hip internal rotation 5 5  Hip external rotation 5 5   (Blank rows = not tested)   LOWER EXTREMITY SPECIAL TESTS:  Ober's: (+) RLE FABER: (+) RLE FADIR: (+ for pain) RLE Scour: (-)  Monk Sit: (-)  SLR: (-)    FUNCTIONAL TESTS:  SLS: 3 sec RLE, 10 sec LLE; trendelenburg sign bilaterally  Double Leg Squat: bilateral foot ER, excessive anterior tibial translation  GAIT: Distance walked: 20 ft Assistive device utilized: None Level of assistance: Complete Independence Comments: WBOS, trendelenburg sign     TODAY'S TREATMENT: [facility] Adult PT Treatment:                                                DATE: 06/12/21 Therapeutic Exercise: NuStep level 6 x 5 minutes UE/LE  Figure 4 30 sec RLE SKC 30 sec RLE  Hamstring stretch 30 sec RLE IT band stretch 30 sec RLE Hip bridge with ball squeeze 2 x 10  Clamshells blue band 2 x 10 bilateral  3 way hip 2 x 10 bilateral occasional single UE support  Standing march 2 x 10  Reviewed  and updated HEP      06/08/2021: NuStep L6 x 5 min with UE/LE while taking subjective Bridge with blue band at knees 2 x 10 with 5 sec hold Side clamshell with blue 2 x 10 Sit to stand holding 15# at chest 2 x 10 Bent over hip extension with green band at knees 2 x 10 each - tactile cueing to engage gluetals  Runner step-up on 8" box with contralateral UE support 2 x 10 each Tandem stance 3 x 30 sec each Resisted side stepping with FM 10# holding handle at chest 2 x 3 each Row with FM 13# 2 x 10    [facility] Adult  PT Treatment:                                                DATE: 06/05/21 Therapeutic Exercise: NuStep level 5 x 5 minutes  Prone quad stretch 30 seconds RLE Lunge hip flexor stretch 30 seconds RLE Prone hip extension x 5 bilateral Prone glute squeeze x 5  Quadruped leg extension 2  x10  Sidelying hip abduction 2 x 10 bilateral  Sit to stand 2 x 10  Step up 8 inch 1 x 10 bilateral  Lateral band walk at counter 2 sets d/b green band at shins Standing march 2 x 12 Tandem balance 1 x 30 sec each  Updated HEP with new Access Code: [email or web address] to determine if it would count stretch duration appropriately.    [facility] Adult PT Treatment:                                                DATE: 06/01/21 Therapeutic Exercise: Hip bridge with adduction isometric 2 x 10 Clamshells blue band 2 x 10 bilateral Resisted hip extension in standing green band 2 x 10  Semi-tandem 30 sec each Tandem 2 x 30 sec each  Reviewed and updated HEP and determined utilization of logging daily exercise within MedBridge app.  Self Care: Recommendation on where to purchase stretch strap   [facility] Adult PT Treatment:                                                DATE: 05/29/21 Therapeutic Exercise: Nustep LE only level 6 x 5 minutes  Figure 4 stretch 30 sec each  Single knee to chest 30 sec each  Supine hip flexor stretch 30 sec each  Supine hamstring stretch 30 sec each IT band stretch 30 sec each  Hip  bridge 2 x 10  Clamshells 2 x 10 Updated HEP Manual Therapy: STM/DTM Rt gluteals, IT band, hamstring Inferior, posterior and anterior Rt hip mobilization grade II-III   PATIENT EDUCATION:  Education details: HEP Person educated: Patient  Education method: Consulting civil engineer, Demonstration, Corporate treasurer cues, Verbal cues, and Handouts Education comprehension: verbalized understanding, returned demonstration, verbal cues required, tactile cues required  HOME EXERCISE PROGRAM: Access Code: [email or web address]    ASSESSMENT: CLINICAL IMPRESSION:   Time spent reviewing HEP as patient arrives with chief complaint that his HEP is causing pain along his sacrococcygeal area. No pain elicited in his sacrococcygeal area with all prescribed exercises that are part of his home program during session, so he was encouraged to try to complete his exercises on a softer surface as the firm surface he is currently completing them on could be a factor in causing this pain. His FOTO score has declined compared to initial evaluation, though this does not correlate with objective improvements thus far. He is challenged in maintaining his balance with SL activity, though he is better able to control trunk/pelvic compensation compared to previous sessions.      GOALS: SHORT TERM GOALS:   STG Name Target Date Goal status  1 Patient will be independent and compliant with initial HEP.  Baseline: issued at eval 06/07/2021 Achieved   2 Patient will improve Rt hamstring length by 10 degrees to reduce overall stress on his hip with recreational activity.  Baseline: lacking 50  06/14/2021 ONGOING    [place] TERM GOALS:    LTG Name Target Date Goal status  1 Patient will maintain SLS on the RLE for at least 5 seconds without hip drop to improve stability with community ambulation.  Baseline: 07/05/2021 INITIAL  2 Patient will demonstrate at least 4+/5 pain free Rt hip abductor and extensor strength to improve stability about the chain  with prolonged walking and standing.  Baseline: 07/05/2021 INITIAL  3 Patient will tolerate at least 30 minutes of walking with </=2/10 hip pain Baseline:10 minutes, pain 7/10.  07/05/2021 INITIAL     PLAN: PT FREQUENCY: 2x/week   PT DURATION: 6 weeks   PLANNED INTERVENTIONS: Therapeutic exercises, Therapeutic activity, Neuro Muscular re-education, Balance training, Gait training, Patient/Family education, Joint mobilization, Stair training, Dry Needling, Electrical stimulation, Cryotherapy, Moist heat, Taping, Ultrasound, Ionotophoresis 4mg /ml Dexamethasone, and Manual therapy   PLAN FOR NEXT SESSION:  hip strengthening, hip stretching.   Gwendolyn Grant, PT, DPT, ATC 06/12/21 10:14 AM

## 2021-06-14 NOTE — Therapy (Signed)
OUTPATIENT PHYSICAL THERAPY TREATMENT NOTE   Patient Name: Paul Tucker MRN: [id number] DOB:[birth year], 71 y.o., male Today's Date: 06/15/2021  PCP: Biagio Borg, MD REFERRING PROVIDER: Biagio Borg, MD   PT End of Session - 06/15/21 0930     Visit Number 7    Number of Visits 13    Date for PT Re-Evaluation 07/08/21    Authorization Type [employer]    PT Start Time 0930    PT Stop Time 1011    PT Time Calculation (min) 41 min    Activity Tolerance Patient tolerated treatment well    Behavior During Therapy [facility] for tasks assessed/performed                  Past Medical History:  Diagnosis Date   Acute prostatitis 04/04/2007   ADD 01/17/2007   Anxiety    DEPRESSION 01/17/2007   Treatment Resident Depression   Dermatophytosis of groin and perianal area 05/31/2008   DIVERTICULOSIS, COLON 04/06/2007   Dysuria 01/27/2009   FLANK PAIN, LEFT 09/30/2007   Flushing 08/26/2009   GLUCOSE INTOLERANCE 04/04/2007   Gross hematuria 01/27/2009   HEMATOCHEZIA 07/26/2008   HYPERLIPIDEMIA 01/20/2007   HYPOGONADISM 06/13/2009   Impaired glucose tolerance 12/10/2010   INSOMNIA 04/04/2007   LIBIDO, DECREASED 0/08/5995   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   LOW BACK PAIN 01/20/2007   OBSTRUCTIVE SLEEP APNEA 04/04/2007   OTITIS MEDIA, ACUTE, BILATERAL 06/13/2009   PANCREATITIS, HX OF 01/17/2007   PSA, INCREASED 11/18/2009   RASH-NONVESICULAR 08/26/2009   SINUSITIS- ACUTE-NOS 04/04/2007   SWEATING 09/30/2007   THRUSH 04/04/2007   TONSILLECTOMY AND ADENOIDECTOMY, HX OF 01/17/2007   URI 07/25/2010   URINARY RETENTION 01/27/2009   VERTIGO 06/13/2009   Past Surgical History:  Procedure Laterality Date   CHOLECYSTECTOMY  2005   HEMORRHOID BANDING  2015   HERNIA REPAIR  1957   INGUINAL HERNIA REPAIR Right 07/27/2020   Procedure: RIGHT INGUINAL HERNIA REPAIR WITH MESH;  Surgeon: Erroll Luna, MD;  Location: [place];  Service: General;  Laterality: Right;   [place]    Patient Active Problem List   Diagnosis Date Noted   Right inguinal hernia 06/09/2020   Vitamin D deficiency 07/25/2019   Elevated PSA 07/23/2019   Abnormal urinalysis 07/23/2019   Weight loss 07/19/2017   Decrease in appetite 07/19/2017   Anxiety 09/11/2016   Microalbuminuria 03/13/2016   Allergic rhinitis 09/13/2015   Allergic conjunctivitis 09/13/2015   B12 deficiency 08/14/2013   Internal hemorrhoids with other complication 74/14/2395   Fecal incontinence 11/12/2012   Dyspepsia and other specified disorders of function of stomach 11/12/2012   Right hip pain 06/11/2012   Dizziness 11/19/2011   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[place]) 32/06/3341   Encounter for well adult exam with abnormal findings 09/10/2010   PSA, INCREASED 11/18/2009   HYPOGONADISM 06/13/2009   Gross hematuria 01/27/2009   URINARY RETENTION 01/27/2009   LIBIDO, DECREASED 12/02/2008   Dermatophytosis of groin and perianal area 56/86/1683   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   Diverticulosis of large intestine 04/06/2007   OBSTRUCTIVE SLEEP APNEA 04/04/2007   INSOMNIA 04/04/2007   Hyperlipidemia 01/20/2007   GERD 01/20/2007   LOW BACK PAIN 01/20/2007   Depression 01/17/2007   Attention deficit disorder 01/17/2007   PANCREATITIS, HX OF 01/17/2007   TONSILLECTOMY AND ADENOIDECTOMY, HX OF 01/17/2007    REFERRING DIAG: M76.30 (ICD-10-CM) - Iliotibial band syndrome, unspecified laterality   THERAPY DIAG:  Pain in  right hip  Difficulty in walking, not elsewhere classified  Stiffness of right hip, not elsewhere classified  PERTINENT HISTORY: N/A  PRECAUTIONS: None   SUBJECTIVE:  Patient reports trying his HEP again on a softer surface, but he continued to have tailbone pain when completing. He has not done a lot of walking since last visit due to the weather. The hip is feeling "ok."  PAIN:  Are you having pain? Yes VAS scale: 4/10 Pain location: mid/lower back  PAIN TYPE: intermittent   Pain description: ache   Aggravating factors: activity   Relieving factors: unknown       OBJECTIVE:  Unless otherwise noted all objective measures were obtained on initial evaluation.  PATIENT SURVEYS:  FOTO 72% function and predicted 06/12/21: 63% function    MUSCLE LENGTH: Hamstring 90/90: Right lacking 50 deg; Left lacking 35 deg; 06/01/21 Right lacking 41, Left lacking 26; 06/15/21 Left lacking 25 Right lacking 30  Thomas test: (+) RLE for iliopsoas and rectus femoris.  Piriformis: moderate tightness bilaterally    POSTURE:  WNL   PALPATION: TTP Rt glute med/max and piriformis.  Symmetrical pelvic alignment in supine and standing.    LE AROM/PROM:  Rt hip P/AROM WNL, pain at end range of flexion, ER, and IR. Lumbar AROM WNL with patient reporting low back pain will all planes of movement, though no hip pain reported.    LE MMT:   MMT Right 05/24/2021 Left 05/24/2021  Hip flexion 4+ 5  Hip extension 4- (pain) 4-  Hip abduction 4-(pain) 4+  Hip adduction 5 5  Hip internal rotation 5 5  Hip external rotation 5 5   (Blank rows = not tested)   LOWER EXTREMITY SPECIAL TESTS:  Ober's: (+) RLE FABER: (+) RLE FADIR: (+ for pain) RLE Scour: (-)  Petti Sit: (-)  SLR: (-)    FUNCTIONAL TESTS:  SLS: 3 sec RLE, 10 sec LLE; trendelenburg sign bilaterally  Double Leg Squat: bilateral foot ER, excessive anterior tibial translation  GAIT: Distance walked: 20 ft Assistive device utilized: None Level of assistance: Complete Independence Comments: WBOS, trendelenburg sign     TODAY'S TREATMENT:  [facility] Adult PT Treatment:                                                DATE: 06/15/21 Therapeutic Exercise: NuStep level 6 x 5 minutes  Standing march 2 x 10  Leg press 2 x 10 @ 60 lbs Resisted hip flexion on cybex 2 x 10 bilateral @ 17.5 lbs  Lateral band walks green band at shins 3 sets d/b at counter  Forward step ups 8 inch 2 x 10 bilateral  Updated HEP   Neuromuscular  re-ed: Tandem 30 sec each Tandem eyes closed 10 sec each Semi-tandem eyes closed 2 x 30 sec each    [facility] Adult PT Treatment:                                                DATE: 06/12/21 Therapeutic Exercise: NuStep level 6 x 5 minutes UE/LE  Figure 4 30 sec RLE SKC 30 sec RLE  Hamstring stretch 30 sec RLE IT band stretch 30 sec RLE Hip bridge with ball squeeze 2 x  10  Clamshells blue band 2 x 10 bilateral  3 way hip 2 x 10 bilateral occasional single UE support  Standing march 2 x 10  Reviewed and updated HEP      06/08/2021: NuStep L6 x 5 min with UE/LE while taking subjective Bridge with blue band at knees 2 x 10 with 5 sec hold Side clamshell with blue 2 x 10 Sit to stand holding 15# at chest 2 x 10 Bent over hip extension with green band at knees 2 x 10 each - tactile cueing to engage gluetals  Runner step-up on 8" box with contralateral UE support 2 x 10 each Tandem stance 3 x 30 sec each Resisted side stepping with FM 10# holding handle at chest 2 x 3 each Row with FM 13# 2 x 10    [facility] Adult PT Treatment:                                                DATE: 06/05/21 Therapeutic Exercise: NuStep level 5 x 5 minutes  Prone quad stretch 30 seconds RLE Lunge hip flexor stretch 30 seconds RLE Prone hip extension x 5 bilateral Prone glute squeeze x 5  Quadruped leg extension 2  x10  Sidelying hip abduction 2 x 10 bilateral  Sit to stand 2 x 10  Step up 8 inch 1 x 10 bilateral  Lateral band walk at counter 2 sets d/b green band at shins Standing march 2 x 12 Tandem balance 1 x 30 sec each  Updated HEP with new Access Code: [email or web address] to determine if it would count stretch duration appropriately.    [facility] Adult PT Treatment:                                                DATE: 06/01/21 Therapeutic Exercise: Hip bridge with adduction isometric 2 x 10 Clamshells blue band 2 x 10 bilateral Resisted hip extension in standing green band 2 x 10  Semi-tandem 30 sec  each Tandem 2 x 30 sec each  Reviewed and updated HEP and determined utilization of logging daily exercise within MedBridge app.  Self Care: Recommendation on where to purchase stretch strap   [facility] Adult PT Treatment:                                                DATE: 05/29/21 Therapeutic Exercise: Nustep LE only level 6 x 5 minutes  Figure 4 stretch 30 sec each  Single knee to chest 30 sec each  Supine hip flexor stretch 30 sec each  Supine hamstring stretch 30 sec each IT band stretch 30 sec each  Hip bridge 2 x 10  Clamshells 2 x 10 Updated HEP Manual Therapy: STM/DTM Rt gluteals, IT band, hamstring Inferior, posterior and anterior Rt hip mobilization grade II-III   PATIENT EDUCATION:  Education details: HEP Person educated: Patient  Education method: Consulting civil engineer, Demonstration, Corporate treasurer cues, Verbal cues, and Handouts Education comprehension: verbalized understanding, returned demonstration, verbal cues required, tactile cues required  HOME EXERCISE PROGRAM: Access Code: [email or web address]    ASSESSMENT: CLINICAL IMPRESSION:  Patient tolerated session well today with progression of standing strengthening. Adjusted HEP to add further standing strengthening and eliminate mat strengthening as he continues to report sacrococcygeal pain when completing these exercises at home. His hamstring flexibility continues to improve having met this short term functional goal. Tandem balance has improved, though he is challenged with eyes closed. He reported occasional ache along the Rt lateral hip with resistance exercises, though this quickly subsides with short rest.    GOALS: SHORT TERM GOALS:   STG Name Target Date Goal status  1 Patient will be independent and compliant with initial HEP.    Baseline: issued at eval 06/07/2021 Achieved   2 Patient will improve Rt hamstring length by 10 degrees to reduce overall stress on his hip with recreational activity.  Baseline: lacking 50  06/14/2021  Achieved     [place] TERM GOALS:    LTG Name Target Date Goal status  1 Patient will maintain SLS on the RLE for at least 5 seconds without hip drop to improve stability with community ambulation.  Baseline: 07/05/2021 INITIAL  2 Patient will demonstrate at least 4+/5 pain free Rt hip abductor and extensor strength to improve stability about the chain with prolonged walking and standing.  Baseline: 07/05/2021 INITIAL  3 Patient will tolerate at least 30 minutes of walking with </=2/10 hip pain Baseline:10 minutes, pain 7/10.  07/05/2021 INITIAL     PLAN: PT FREQUENCY: 2x/week   PT DURATION: 6 weeks   PLANNED INTERVENTIONS: Therapeutic exercises, Therapeutic activity, Neuro Muscular re-education, Balance training, Gait training, Patient/Family education, Joint mobilization, Stair training, Dry Needling, Electrical stimulation, Cryotherapy, Moist heat, Taping, Ultrasound, Ionotophoresis 29m/ml Dexamethasone, and Manual therapy   PLAN FOR NEXT SESSION:  hip strengthening, hip stretching; treadmill   SGwendolyn Grant PT, DPT, ATC 06/15/21 10:12 AM

## 2021-06-15 ENCOUNTER — Other Ambulatory Visit: Payer: Self-pay

## 2021-06-15 ENCOUNTER — Ambulatory Visit: Payer: PPO

## 2021-06-15 DIAGNOSIS — M25551 Pain in right hip: Secondary | ICD-10-CM

## 2021-06-15 DIAGNOSIS — R262 Difficulty in walking, not elsewhere classified: Secondary | ICD-10-CM

## 2021-06-15 DIAGNOSIS — M25651 Stiffness of right hip, not elsewhere classified: Secondary | ICD-10-CM

## 2021-06-19 ENCOUNTER — Other Ambulatory Visit: Payer: Self-pay

## 2021-06-19 ENCOUNTER — Ambulatory Visit: Payer: PPO

## 2021-06-19 DIAGNOSIS — M25651 Stiffness of right hip, not elsewhere classified: Secondary | ICD-10-CM

## 2021-06-19 DIAGNOSIS — M25551 Pain in right hip: Secondary | ICD-10-CM | POA: Diagnosis not present

## 2021-06-19 DIAGNOSIS — R262 Difficulty in walking, not elsewhere classified: Secondary | ICD-10-CM

## 2021-06-19 NOTE — Therapy (Signed)
OUTPATIENT PHYSICAL THERAPY TREATMENT NOTE   Patient Name: Paul Tucker MRN: [id number] DOB:10-Aug-1950, 71 y.o., male Today's Date: 06/22/2021  PCP: Biagio Borg, MD REFERRING PROVIDER: Biagio Borg, MD   PT End of Session - 06/22/21 [phone]     Visit Number 9    Number of Visits 13    Date for PT Re-Evaluation 07/08/21    Authorization Type [employer]    PT Start Time 0930    PT Stop Time 1012    PT Time Calculation (min) 42 min    Activity Tolerance Patient tolerated treatment well    Behavior During Therapy [facility] for tasks assessed/performed                    Past Medical History:  Diagnosis Date   Acute prostatitis 04/04/2007   ADD 01/17/2007   Anxiety    DEPRESSION 01/17/2007   Treatment Resident Depression   Dermatophytosis of groin and perianal area 05/31/2008   DIVERTICULOSIS, COLON 04/06/2007   Dysuria 01/27/2009   FLANK PAIN, LEFT 09/30/2007   Flushing 08/26/2009   GLUCOSE INTOLERANCE 04/04/2007   Gross hematuria 01/27/2009   HEMATOCHEZIA 07/26/2008   HYPERLIPIDEMIA 01/20/2007   HYPOGONADISM 06/13/2009   Impaired glucose tolerance 12/10/2010   INSOMNIA 04/04/2007   LIBIDO, DECREASED 10/05/6158   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   LOW BACK PAIN 01/20/2007   OBSTRUCTIVE SLEEP APNEA 04/04/2007   OTITIS MEDIA, ACUTE, BILATERAL 06/13/2009   PANCREATITIS, HX OF 01/17/2007   PSA, INCREASED 11/18/2009   RASH-NONVESICULAR 08/26/2009   SINUSITIS- ACUTE-NOS 04/04/2007   SWEATING 09/30/2007   THRUSH 04/04/2007   TONSILLECTOMY AND ADENOIDECTOMY, HX OF 01/17/2007   URI 07/25/2010   URINARY RETENTION 01/27/2009   VERTIGO 06/13/2009   Past Surgical History:  Procedure Laterality Date   CHOLECYSTECTOMY  2005   HEMORRHOID BANDING  2015   HERNIA REPAIR  1957   INGUINAL HERNIA REPAIR Right 07/27/2020   Procedure: RIGHT INGUINAL HERNIA REPAIR WITH MESH;  Surgeon: Erroll Luna, MD;  Location: [place];  Service: General;  Laterality: Right;   [place]    Patient Active Problem List   Diagnosis Date Noted   Right inguinal hernia 06/09/2020   Vitamin D deficiency 07/25/2019   Elevated PSA 07/23/2019   Abnormal urinalysis 07/23/2019   Weight loss 07/19/2017   Decrease in appetite 07/19/2017   Anxiety 09/11/2016   Microalbuminuria 03/13/2016   Allergic rhinitis 09/13/2015   Allergic conjunctivitis 09/13/2015   B12 deficiency 08/14/2013   Internal hemorrhoids with other complication 73/71/0626   Fecal incontinence 11/12/2012   Dyspepsia and other specified disorders of function of stomach 11/12/2012   Right hip pain 06/11/2012   Dizziness 11/19/2011   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[place]) 94/85/4627   Encounter for well adult exam with abnormal findings 09/10/2010   PSA, INCREASED 11/18/2009   HYPOGONADISM 06/13/2009   Gross hematuria 01/27/2009   URINARY RETENTION 01/27/2009   LIBIDO, DECREASED 12/02/2008   Dermatophytosis of groin and perianal area 03/50/0938   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   Diverticulosis of large intestine 04/06/2007   OBSTRUCTIVE SLEEP APNEA 04/04/2007   INSOMNIA 04/04/2007   Hyperlipidemia 01/20/2007   GERD 01/20/2007   LOW BACK PAIN 01/20/2007   Depression 01/17/2007   Attention deficit disorder 01/17/2007   PANCREATITIS, HX OF 01/17/2007   TONSILLECTOMY AND ADENOIDECTOMY, HX OF 01/17/2007    REFERRING DIAG: M76.30 (ICD-10-CM) - Iliotibial band syndrome, unspecified laterality   THERAPY DIAG:  Pain in right hip  Difficulty in walking, not elsewhere classified  Stiffness of right hip, not elsewhere classified  PERTINENT HISTORY: N/A  PRECAUTIONS: None   SUBJECTIVE:  Patient had f/u with PCP yesterday and had X-rays of the hip that were unremarkable. He has an appointment with sport med next week due to ongoing hip pain. He reports no pain currently.  PAIN:  Are you having pain? No VAS scale: 0/10 Pain location: N/A PAIN TYPE: N/A  Pain description: N/A   Aggravating  factors: N/A   Relieving factors: N/A       OBJECTIVE:  Unless otherwise noted all objective measures were obtained on initial evaluation.  PATIENT SURVEYS:  FOTO 72% function and predicted 06/12/21: 63% function    MUSCLE LENGTH: Hamstring 90/90: Right lacking 50 deg; Left lacking 35 deg; 06/01/21 Right lacking 41, Left lacking 26; 06/15/21 Left lacking 25 Right lacking 30  Thomas test: (+) RLE for iliopsoas and rectus femoris.  Piriformis: moderate tightness bilaterally    POSTURE:  WNL   PALPATION: TTP Rt glute med/max and piriformis.  Symmetrical pelvic alignment in supine and standing.   06/19/21: Rt ASIS and iliac crest elevated in standing and supine  06/22/21: symmetrical pelvic alignment    LE AROM/PROM:  Rt hip P/AROM WNL, pain at end range of flexion, ER, and IR. Lumbar AROM WNL with patient reporting low back pain will all planes of movement, though no hip pain reported.    LE MMT:   MMT Right 05/24/2021 Left 05/24/2021 06/22/21  Hip flexion 4+ 5   Hip extension 4- (pain) 4-   Hip abduction 4-(pain) 4+ Right 4-/5 concordant pain  Hip adduction 5 5   Hip internal rotation 5 5   Hip external rotation 5 5    (Blank rows = not tested)   LOWER EXTREMITY SPECIAL TESTS:  Ober's: (+) RLE FABER: (+) RLE FADIR: (+ for pain) RLE Scour: (-)  [place] Sit: (-)  SLR: (-)   06/19/21: (+) [place]owski sit RLE longer    FUNCTIONAL TESTS:  SLS: 3 sec RLE, 10 sec LLE; trendelenburg sign bilaterally  Double Leg Squat: bilateral foot ER, excessive anterior tibial translation  GAIT: Distance walked: 20 ft Assistive device utilized: None Level of assistance: Complete Independence Comments: WBOS, trendelenburg sign     TODAY'S TREATMENT: [facility] Adult PT Treatment:                                                DATE: 06/22/21 Therapeutic Exercise: Treadmill 1.3 x 5 minutes  Sidelying hip abduction RLE 2 x 10  Sidelying leg taps fwd/bwd 2 x 10 bilaterally  Resisted hip flexion on cybex 2 x 10  @ 17.5 lbs  Leg press with green band around thighs for abductor activation 1 x 10 @ 20 lbs, 1 x 10 @ 40 lbs  Squat to chair 10 lb kettlebell 1 x 10  Updated HEP  Manual therapy: DTM to Rt glute med/min/max, piriformis, TFL     [facility] Adult PT Treatment:                                                DATE: 06/19/21 Therapeutic Exercise: Treadmill level 1.3 x 10 minutes  Treadmill level 1.3 x 10 minutes (following manual therapy)  Lateral band walks green band 3 sets d/b at counter  Lateral step ups 2 x 10 8 inch step bilateral Forward step up to knee driver 2 x 10 bilateral  Reviewed HEP    Manual: Muscle energy technique to improve pelvic symmetry    [facility] Adult PT Treatment:                                                DATE: 06/15/21 Therapeutic Exercise: NuStep level 6 x 5 minutes  Standing march 2 x 10  Leg press 2 x 10 @ 60 lbs Resisted hip flexion on cybex 2 x 10 bilateral @ 17.5 lbs  Lateral band walks green band at shins 3 sets d/b at counter  Forward step ups 8 inch 2 x 10 bilateral  Updated HEP   Neuromuscular re-ed: Tandem 30 sec each Tandem eyes closed 10 sec each Semi-tandem eyes closed 2 x 30 sec each    [facility] Adult PT Treatment:                                                DATE: 06/12/21 Therapeutic Exercise: NuStep level 6 x 5 minutes UE/LE  Figure 4 30 sec RLE SKC 30 sec RLE  Hamstring stretch 30 sec RLE IT band stretch 30 sec RLE Hip bridge with ball squeeze 2 x 10  Clamshells blue band 2 x 10 bilateral  3 way hip 2 x 10 bilateral occasional single UE support  Standing march 2 x 10  Reviewed and updated HEP      06/08/2021: NuStep L6 x 5 min with UE/LE while taking subjective Bridge with blue band at knees 2 x 10 with 5 sec hold Side clamshell with blue 2 x 10 Sit to stand holding 15# at chest 2 x 10 Bent over hip extension with green band at knees 2 x 10 each - tactile cueing to engage gluetals  Runner step-up on 8" box with contralateral  UE support 2 x 10 each Tandem stance 3 x 30 sec each Resisted side stepping with FM 10# holding handle at chest 2 x 3 each Row with FM 13# 2 x 10    [facility] Adult PT Treatment:                                                DATE: 06/05/21 Therapeutic Exercise: NuStep level 5 x 5 minutes  Prone quad stretch 30 seconds RLE Lunge hip flexor stretch 30 seconds RLE Prone hip extension x 5 bilateral Prone glute squeeze x 5  Quadruped leg extension 2  x10  Sidelying hip abduction 2 x 10 bilateral  Sit to stand 2 x 10  Step up 8 inch 1 x 10 bilateral  Lateral band walk at counter 2 sets d/b green band at shins Standing march 2 x 12 Tandem balance 1 x 30 sec each  Updated HEP with new Access Code: [email or web address] to determine if it would count stretch duration appropriately.    [facility] Adult PT Treatment:  DATE: 06/01/21 Therapeutic Exercise: Hip bridge with adduction isometric 2 x 10 Clamshells blue band 2 x 10 bilateral Resisted hip extension in standing green band 2 x 10  Semi-tandem 30 sec each Tandem 2 x 30 sec each  Reviewed and updated HEP and determined utilization of logging daily exercise within MedBridge app.  Self Care: Recommendation on where to purchase stretch strap      PATIENT EDUCATION:  Education details: HEP Person educated: Patient  Education method: Explanation, Demonstration, Tactile cues, Verbal cues, and Handouts Education comprehension: verbalized understanding, returned demonstration, verbal cues required, tactile cues required  HOME EXERCISE PROGRAM: Access Code: [email or web address]    ASSESSMENT: CLINICAL IMPRESSION:   Patient is noted to have symmetrical pelvic alignment upon arrival. Mild lateral Rt hip pain noted after 4 minutes of treadmill walking that subsided after conclusion of walking. He has mild tenderness and tautness about glute medius along insertion and piriformis along origin. He reports concordant pain with hip  abduction MMT with notable weakness secondary to pain. He is able to tolerate targeted hip abductor strengthening in sidelying with mild ache reported in Rt lateral hip during sidelying leg taps towards end of prescribed reps, otherwise no reports of hip pain with strengthening today.    GOALS: SHORT TERM GOALS:   STG Name Target Date Goal status  1 Patient will be independent and compliant with initial HEP.    Baseline: issued at eval 06/07/2021 Achieved   2 Patient will improve Rt hamstring length by 10 degrees to reduce overall stress on his hip with recreational activity.  Baseline: lacking 50  06/14/2021 Achieved     [place] TERM GOALS:    LTG Name Target Date Goal status  1 Patient will maintain SLS on the RLE for at least 5 seconds without hip drop to improve stability with community ambulation.  Baseline: 07/05/2021 INITIAL  2 Patient will demonstrate at least 4+/5 pain free Rt hip abductor and extensor strength to improve stability about the chain with prolonged walking and standing.  Baseline: 07/05/2021 INITIAL  3 Patient will tolerate at least 30 minutes of walking with </=2/10 hip pain Baseline:10 minutes, pain 4/10 07/05/2021 Ongoing 06/19/21     PLAN: PT FREQUENCY: 2x/week   PT DURATION: 6 weeks   PLANNED INTERVENTIONS: Therapeutic exercises, Therapeutic activity, Neuro Muscular re-education, Balance training, Gait training, Patient/Family education, Joint mobilization, Stair training, Dry Needling, Electrical stimulation, Cryotherapy, Moist heat, Taping, Ultrasound, Ionotophoresis 4mg /ml Dexamethasone, and Manual therapy   PLAN FOR NEXT SESSION:  hip strengthening, hip stretching; treadmill; check hip levels.   Gwendolyn Grant, PT, DPT, ATC 06/22/21 10:13 AM

## 2021-06-19 NOTE — Therapy (Signed)
OUTPATIENT PHYSICAL THERAPY TREATMENT NOTE   Patient Name: Paul Tucker MRN: [id number] DOB:10-19-1950, 71 y.o., male Today's Date: 06/19/2021  PCP: Biagio Borg, MD REFERRING PROVIDER: Biagio Borg, MD   PT End of Session - 06/19/21 0920     Visit Number 8    Number of Visits 13    Date for PT Re-Evaluation 07/08/21    Authorization Type [employer]    PT Start Time 0928    PT Stop Time 1024    PT Time Calculation (min) 56 min    Activity Tolerance Patient tolerated treatment well    Behavior During Therapy [facility] for tasks assessed/performed                   Past Medical History:  Diagnosis Date   Acute prostatitis 04/04/2007   ADD 01/17/2007   Anxiety    DEPRESSION 01/17/2007   Treatment Resident Depression   Dermatophytosis of groin and perianal area 05/31/2008   DIVERTICULOSIS, COLON 04/06/2007   Dysuria 01/27/2009   FLANK PAIN, LEFT 09/30/2007   Flushing 08/26/2009   GLUCOSE INTOLERANCE 04/04/2007   Gross hematuria 01/27/2009   HEMATOCHEZIA 07/26/2008   HYPERLIPIDEMIA 01/20/2007   HYPOGONADISM 06/13/2009   Impaired glucose tolerance 12/10/2010   INSOMNIA 04/04/2007   LIBIDO, DECREASED 0/86/5784   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   LOW BACK PAIN 01/20/2007   OBSTRUCTIVE SLEEP APNEA 04/04/2007   OTITIS MEDIA, ACUTE, BILATERAL 06/13/2009   PANCREATITIS, HX OF 01/17/2007   PSA, INCREASED 11/18/2009   RASH-NONVESICULAR 08/26/2009   SINUSITIS- ACUTE-NOS 04/04/2007   SWEATING 09/30/2007   THRUSH 04/04/2007   TONSILLECTOMY AND ADENOIDECTOMY, HX OF 01/17/2007   URI 07/25/2010   URINARY RETENTION 01/27/2009   VERTIGO 06/13/2009   Past Surgical History:  Procedure Laterality Date   CHOLECYSTECTOMY  2005   HEMORRHOID BANDING  2015   HERNIA REPAIR  1957   INGUINAL HERNIA REPAIR Right 07/27/2020   Procedure: RIGHT INGUINAL HERNIA REPAIR WITH MESH;  Surgeon: Erroll Luna, MD;  Location: [place];  Service: General;  Laterality: Right;   [place]    Patient Active Problem List   Diagnosis Date Noted   Right inguinal hernia 06/09/2020   Vitamin D deficiency 07/25/2019   Elevated PSA 07/23/2019   Abnormal urinalysis 07/23/2019   Weight loss 07/19/2017   Decrease in appetite 07/19/2017   Anxiety 09/11/2016   Microalbuminuria 03/13/2016   Allergic rhinitis 09/13/2015   Allergic conjunctivitis 09/13/2015   B12 deficiency 08/14/2013   Internal hemorrhoids with other complication 69/62/9528   Fecal incontinence 11/12/2012   Dyspepsia and other specified disorders of function of stomach 11/12/2012   Right hip pain 06/11/2012   Dizziness 11/19/2011   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[place]) 41/32/4401   Encounter for well adult exam with abnormal findings 09/10/2010   PSA, INCREASED 11/18/2009   HYPOGONADISM 06/13/2009   Gross hematuria 01/27/2009   URINARY RETENTION 01/27/2009   LIBIDO, DECREASED 12/02/2008   Dermatophytosis of groin and perianal area 02/72/5366   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   Diverticulosis of large intestine 04/06/2007   OBSTRUCTIVE SLEEP APNEA 04/04/2007   INSOMNIA 04/04/2007   Hyperlipidemia 01/20/2007   GERD 01/20/2007   LOW BACK PAIN 01/20/2007   Depression 01/17/2007   Attention deficit disorder 01/17/2007   PANCREATITIS, HX OF 01/17/2007   TONSILLECTOMY AND ADENOIDECTOMY, HX OF 01/17/2007    REFERRING DIAG: M76.30 (ICD-10-CM) - Iliotibial band syndrome, unspecified laterality   THERAPY DIAG:  Pain  in right hip  Difficulty in walking, not elsewhere classified  Stiffness of right hip, not elsewhere classified  PERTINENT HISTORY: N/A  PRECAUTIONS: None   SUBJECTIVE:  Patient reports pain when performing lateral band walks as part of HEP in his right hip and was unable to complete all prescribed reps due to pain. No other issues regarding HEP. No pain currently. Was not able to do a lot of walking this weekend.  PAIN:  Are you having pain? No VAS scale: 0/10 Pain location:  N/A PAIN TYPE: N/A  Pain description: N/A   Aggravating factors: N/A   Relieving factors: N/A       OBJECTIVE:  Unless otherwise noted all objective measures were obtained on initial evaluation.  PATIENT SURVEYS:  FOTO 72% function and predicted 06/12/21: 63% function    MUSCLE LENGTH: Hamstring 90/90: Right lacking 50 deg; Left lacking 35 deg; 06/01/21 Right lacking 41, Left lacking 26; 06/15/21 Left lacking 25 Right lacking 30  Thomas test: (+) RLE for iliopsoas and rectus femoris.  Piriformis: moderate tightness bilaterally    POSTURE:  WNL   PALPATION: TTP Rt glute med/max and piriformis.  Symmetrical pelvic alignment in supine and standing.   06/19/21: Rt ASIS and iliac crest elevated in standing and supine    LE AROM/PROM:  Rt hip P/AROM WNL, pain at end range of flexion, ER, and IR. Lumbar AROM WNL with patient reporting low back pain will all planes of movement, though no hip pain reported.    LE MMT:   MMT Right 05/24/2021 Left 05/24/2021  Hip flexion 4+ 5  Hip extension 4- (pain) 4-  Hip abduction 4-(pain) 4+  Hip adduction 5 5  Hip internal rotation 5 5  Hip external rotation 5 5   (Blank rows = not tested)   LOWER EXTREMITY SPECIAL TESTS:  Ober's: (+) RLE FABER: (+) RLE FADIR: (+ for pain) RLE Scour: (-)  [place] Sit: (-)  SLR: (-)   06/19/21: (+) [place] sit RLE longer    FUNCTIONAL TESTS:  SLS: 3 sec RLE, 10 sec LLE; trendelenburg sign bilaterally  Double Leg Squat: bilateral foot ER, excessive anterior tibial translation  GAIT: Distance walked: 20 ft Assistive device utilized: None Level of assistance: Complete Independence Comments: WBOS, trendelenburg sign     TODAY'S TREATMENT: [facility] Adult PT Treatment:                                                DATE: 06/19/21 Therapeutic Exercise: Treadmill level 1.3 x 10 minutes  Treadmill level 1.3 x 10 minutes (following manual therapy)  Lateral band walks green band 3 sets d/b at counter  Lateral step ups  2 x 10 8 inch step bilateral Forward step up to knee driver 2 x 10 bilateral  Reviewed HEP    Manual: Muscle energy technique to improve pelvic symmetry    [facility] Adult PT Treatment:                                                DATE: 06/15/21 Therapeutic Exercise: NuStep level 6 x 5 minutes  Standing march 2 x 10  Leg press 2 x 10 @ 60 lbs Resisted hip flexion on cybex 2 x 10  bilateral @ 17.5 lbs  Lateral band walks green band at shins 3 sets d/b at counter  Forward step ups 8 inch 2 x 10 bilateral  Updated HEP   Neuromuscular re-ed: Tandem 30 sec each Tandem eyes closed 10 sec each Semi-tandem eyes closed 2 x 30 sec each    [facility] Adult PT Treatment:                                                DATE: 06/12/21 Therapeutic Exercise: NuStep level 6 x 5 minutes UE/LE  Figure 4 30 sec RLE SKC 30 sec RLE  Hamstring stretch 30 sec RLE IT band stretch 30 sec RLE Hip bridge with ball squeeze 2 x 10  Clamshells blue band 2 x 10 bilateral  3 way hip 2 x 10 bilateral occasional single UE support  Standing march 2 x 10  Reviewed and updated HEP      06/08/2021: NuStep L6 x 5 min with UE/LE while taking subjective Bridge with blue band at knees 2 x 10 with 5 sec hold Side clamshell with blue 2 x 10 Sit to stand holding 15# at chest 2 x 10 Bent over hip extension with green band at knees 2 x 10 each - tactile cueing to engage gluetals  Runner step-up on 8" box with contralateral UE support 2 x 10 each Tandem stance 3 x 30 sec each Resisted side stepping with FM 10# holding handle at chest 2 x 3 each Row with FM 13# 2 x 10    [facility] Adult PT Treatment:                                                DATE: 06/05/21 Therapeutic Exercise: NuStep level 5 x 5 minutes  Prone quad stretch 30 seconds RLE Lunge hip flexor stretch 30 seconds RLE Prone hip extension x 5 bilateral Prone glute squeeze x 5  Quadruped leg extension 2  x10  Sidelying hip abduction 2 x 10 bilateral  Sit to  stand 2 x 10  Step up 8 inch 1 x 10 bilateral  Lateral band walk at counter 2 sets d/b green band at shins Standing march 2 x 12 Tandem balance 1 x 30 sec each  Updated HEP with new Access Code: [email or web address] to determine if it would count stretch duration appropriately.    [facility] Adult PT Treatment:                                                DATE: 06/01/21 Therapeutic Exercise: Hip bridge with adduction isometric 2 x 10 Clamshells blue band 2 x 10 bilateral Resisted hip extension in standing green band 2 x 10  Semi-tandem 30 sec each Tandem 2 x 30 sec each  Reviewed and updated HEP and determined utilization of logging daily exercise within MedBridge app.  Self Care: Recommendation on where to purchase stretch strap      PATIENT EDUCATION:  Education details: HEP Person educated: Patient  Education method: Explanation, Demonstration, Tactile cues, Verbal cues, and Handouts Education comprehension: verbalized understanding, returned demonstration, verbal cues  required, tactile cues required  HOME EXERCISE PROGRAM: Access Code: [email or web address]    ASSESSMENT: CLINICAL IMPRESSION:   Able to complete 10 minutes of treadmill walking with onset of mild Rt lateral hip pain that began after 5 minutes rated as 3-4/10. He has notable trendelenburg during stance on the RLE that becomes more apparent with increased time walked during initial bout of treadmill walking. Patient noted to have Rt posterior rotated innominate that was corrected after first round of treadmill walking. Following manual therapy, treadmill walking was performed again for 10 minutes with patient reporting 2-3/10 along Rt anterior hip that began after 5 minutes with less notable pelvic drop. He has more difficulty maintaining balance/stability during SL activity on the RLE compared to LLE requiring more frequent UE support when performing step ups on the RLE. He was able to complete lateral band walks with good form without latera  hip pain elicited today and was encouraged to try again as part of HEP, though instructed if this creates increased pain again to discontinue.    GOALS: SHORT TERM GOALS:   STG Name Target Date Goal status  1 Patient will be independent and compliant with initial HEP.    Baseline: issued at eval 06/07/2021 Achieved   2 Patient will improve Rt hamstring length by 10 degrees to reduce overall stress on his hip with recreational activity.  Baseline: lacking 50  06/14/2021 Achieved     [place] TERM GOALS:    LTG Name Target Date Goal status  1 Patient will maintain SLS on the RLE for at least 5 seconds without hip drop to improve stability with community ambulation.  Baseline: 07/05/2021 INITIAL  2 Patient will demonstrate at least 4+/5 pain free Rt hip abductor and extensor strength to improve stability about the chain with prolonged walking and standing.  Baseline: 07/05/2021 INITIAL  3 Patient will tolerate at least 30 minutes of walking with </=2/10 hip pain Baseline:10 minutes, pain 4/10 07/05/2021 Ongoing 06/19/21     PLAN: PT FREQUENCY: 2x/week   PT DURATION: 6 weeks   PLANNED INTERVENTIONS: Therapeutic exercises, Therapeutic activity, Neuro Muscular re-education, Balance training, Gait training, Patient/Family education, Joint mobilization, Stair training, Dry Needling, Electrical stimulation, Cryotherapy, Moist heat, Taping, Ultrasound, Ionotophoresis 4mg /ml Dexamethasone, and Manual therapy   PLAN FOR NEXT SESSION:  hip strengthening, hip stretching; treadmill; check hip levels.   Gwendolyn Grant, PT, DPT, ATC 06/19/21 10:28 AM

## 2021-06-21 ENCOUNTER — Encounter: Payer: Self-pay | Admitting: Internal Medicine

## 2021-06-21 ENCOUNTER — Other Ambulatory Visit: Payer: Self-pay

## 2021-06-21 ENCOUNTER — Ambulatory Visit (INDEPENDENT_AMBULATORY_CARE_PROVIDER_SITE_OTHER): Payer: PPO | Admitting: Internal Medicine

## 2021-06-21 ENCOUNTER — Ambulatory Visit (INDEPENDENT_AMBULATORY_CARE_PROVIDER_SITE_OTHER): Payer: PPO

## 2021-06-21 ENCOUNTER — Ambulatory Visit: Payer: PPO | Admitting: Sports Medicine

## 2021-06-21 VITALS — BP 110/66 | HR 73 | Temp 99.0°F | Ht 73.0 in | Wt 212.0 lb

## 2021-06-21 DIAGNOSIS — E559 Vitamin D deficiency, unspecified: Secondary | ICD-10-CM | POA: Diagnosis not present

## 2021-06-21 DIAGNOSIS — E538 Deficiency of other specified B group vitamins: Secondary | ICD-10-CM | POA: Diagnosis not present

## 2021-06-21 DIAGNOSIS — M79604 Pain in right leg: Secondary | ICD-10-CM

## 2021-06-21 DIAGNOSIS — R972 Elevated prostate specific antigen [PSA]: Secondary | ICD-10-CM

## 2021-06-21 DIAGNOSIS — E7800 Pure hypercholesterolemia, unspecified: Secondary | ICD-10-CM

## 2021-06-21 DIAGNOSIS — E1340 Other specified diabetes mellitus with diabetic neuropathy, unspecified: Secondary | ICD-10-CM | POA: Diagnosis not present

## 2021-06-21 DIAGNOSIS — M25551 Pain in right hip: Secondary | ICD-10-CM | POA: Diagnosis not present

## 2021-06-21 NOTE — Progress Notes (Signed)
Patient ID: Paul Tucker, male   DOB: 06-10-1950, 71 y.o.   MRN: [id number]        Chief Complaint: follow up right hip pain       HPI:  Paul Tucker is a 71 y.o. male here with c/o persistent right upper lateral hip pain; had a fall with initial injury 2014, xray neg, saw ortho at the time and resolved, but similar pain seems to be returned; not really improving well with PT who then suggested he may have bursitis instead this time, though has no soreness or pain to lie on the right side; no worsening low back pain.  Pt denies chest pain, increased sob or doe, wheezing, orthopnea, PND, increased LE swelling, palpitations, dizziness or syncope.   Pt denies polydipsia, polyuria, or new focal neuro s/s.   Pt denies fever, wt loss, night sweats, loss of appetite, or other constitutional symptoms  Denies urinary symptoms such as dysuria, frequency, urgency, flank pain, hematuria or n/v, fever, chills.  Not taking B12 or Vit D.         Wt Readings from Last 3 Encounters:  06/21/21 212 lb (96.2 kg)  04/28/21 213 lb 3.2 oz (96.7 kg)  03/08/21 201 lb (91.2 kg)   BP Readings from Last 3 Encounters:  06/21/21 110/66  04/28/21 130/80  03/02/21 129/76         Past Medical History:  Diagnosis Date   Acute prostatitis 04/04/2007   ADD 01/17/2007   Anxiety    DEPRESSION 01/17/2007   Treatment Resident Depression   Dermatophytosis of groin and perianal area 05/31/2008   DIVERTICULOSIS, COLON 04/06/2007   Dysuria 01/27/2009   FLANK PAIN, LEFT 09/30/2007   Flushing 08/26/2009   GLUCOSE INTOLERANCE 04/04/2007   Gross hematuria 01/27/2009   HEMATOCHEZIA 07/26/2008   HYPERLIPIDEMIA 01/20/2007   HYPOGONADISM 06/13/2009   Impaired glucose tolerance 12/10/2010   INSOMNIA 04/04/2007   LIBIDO, DECREASED 0/62/6948   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   LOW BACK PAIN 01/20/2007   OBSTRUCTIVE SLEEP APNEA 04/04/2007   OTITIS MEDIA, ACUTE, BILATERAL 06/13/2009   PANCREATITIS, HX OF 01/17/2007   PSA, INCREASED 11/18/2009    RASH-NONVESICULAR 08/26/2009   SINUSITIS- ACUTE-NOS 04/04/2007   SWEATING 09/30/2007   THRUSH 04/04/2007   TONSILLECTOMY AND ADENOIDECTOMY, HX OF 01/17/2007   URI 07/25/2010   URINARY RETENTION 01/27/2009   VERTIGO 06/13/2009   Past Surgical History:  Procedure Laterality Date   CHOLECYSTECTOMY  2005   HEMORRHOID BANDING  2015   HERNIA REPAIR  1957   INGUINAL HERNIA REPAIR Right 07/27/2020   Procedure: RIGHT INGUINAL HERNIA REPAIR WITH MESH;  Surgeon: Erroll Luna, MD;  Location: [place];  Service: General;  Laterality: Right;   [place]    reports that he has never smoked. He has never used smokeless tobacco. He reports that he does not drink alcohol and does not use drugs. family history includes Coronary artery disease in an other family member; Diabetes in an other family member; Emphysema in his father; Heart disease in his father. Allergies  Allergen Reactions   Atrovent [Ipratropium] Swelling    Throat swelling   Pravastatin Sodium Nausea Only    Pt does not recall this reaction   Current Outpatient Medications on File Prior to Visit  Medication Sig Dispense Refill   aspirin 81 MG EC tablet Take 81 mg by mouth daily.     Ciclopirox 0.77 % gel USE AS DIRECTED TOPCIALLY 45 g 1  diclofenac Sodium (VOLTAREN) 1 % GEL Apply 4 g topically in the morning and at bedtime.     diclofenac Sodium (VOLTAREN) 1 % GEL      diphenhydrAMINE (BENADRYL) 25 MG tablet Take 25 mg by mouth every 6 (six) hours as needed for allergies.     fluocinonide cream (LIDEX) 8.54 % Apply 1 application topically 2 (two) times daily. 30 g 0   ibuprofen (ADVIL) 800 MG tablet Take 1 tablet (800 mg total) by mouth every 8 (eight) hours as needed. 30 tablet 0   LORazepam (ATIVAN) 0.5 MG tablet Take 0.5 mg by mouth 2 (two) times daily as needed.     lovastatin (MEVACOR) 40 MG tablet TAKE ONE TABLET BY MOUTH AT BEDTIME 90 tablet 3   meclizine (ANTIVERT) 25 MG tablet TAKE ONE TABLET BY MOUTH THREE  TIMES A DAY AS NEEDED FOR DIZZINESS 30 tablet 2   meloxicam (MOBIC) 15 MG tablet Take 15 mg by mouth daily.     mometasone (ELOCON) 0.1 % cream Apply 1 application topically daily. 45 g 0   tamsulosin (FLOMAX) 0.4 MG CAPS capsule TAKE ONE CAPSULE BY MOUTH DAILY 90 capsule 1   TRINTELLIX 20 MG TABS tablet Take 20 mg by mouth daily.     zolpidem (AMBIEN) 10 MG tablet TAKE ONE TABLET BY MOUTH EVERY NIGHT AT BEDTIME 90 tablet 1   No current facility-administered medications on file prior to visit.        ROS:  All others reviewed and negative.  Objective        PE:  BP 110/66 (BP Location: Left Arm, Patient Position: Sitting, Cuff Size: Large)    Pulse 73    Temp 99 F (37.2 C) (Oral)    Ht 6\' 1"  (1.854 m)    Wt 212 lb (96.2 kg)    SpO2 94%    BMI 27.97 kg/m                 Constitutional: Pt appears in NAD               HENT: Head: NCAT.                Right Ear: External ear normal.                 Left Ear: External ear normal.                Eyes: . Pupils are equal, round, and reactive to light. Conjunctivae and EOM are normal               Nose: without d/c or deformity               Neck: Neck supple. Gross normal ROM               Cardiovascular: Normal rate and regular rhythm.                 Pulmonary/Chest: Effort normal and breath sounds without rales or wheezing.                Abd:  Soft, NT, ND, + BS, no organomegaly               Neurological: Pt is alert. At baseline orientation, motor grossly intact               Skin: Skin is warm. No rashes, no other new lesions, LE edema - none  Right lateral hip nontender               Psychiatric: Pt behavior is normal without agitation   Micro: none  Cardiac tracings I have personally interpreted today:  none  Pertinent Radiological findings (summarize): none   Lab Results  Component Value Date   WBC 5.7 07/27/2020   HGB 14.6 07/27/2020   HCT 41.6 07/27/2020   PLT 212 07/27/2020   GLUCOSE 144 (H) 01/19/2021    CHOL 123 01/19/2021   TRIG 80.0 01/19/2021   HDL 49.00 01/19/2021   LDLDIRECT 82.0 02/28/2017   LDLCALC 58 01/19/2021   ALT 15 01/19/2021   AST 14 01/19/2021   NA 140 01/19/2021   K 4.0 01/19/2021   CL 105 01/19/2021   CREATININE 1.04 01/19/2021   BUN 20 01/19/2021   CO2 26 01/19/2021   TSH 1.30 07/12/2020   PSA 4.30 (H) 01/19/2021   HGBA1C 5.7 01/19/2021   MICROALBUR 1.9 07/20/2020   Assessment/Plan:  Paul Tucker is a 71 y.o. White or Caucasian [1] male with  has a past medical history of Acute prostatitis (04/04/2007), ADD (01/17/2007), Anxiety, DEPRESSION (01/17/2007), Dermatophytosis of groin and perianal area (05/31/2008), DIVERTICULOSIS, COLON (04/06/2007), Dysuria (01/27/2009), FLANK PAIN, LEFT (09/30/2007), Flushing (08/26/2009), GLUCOSE INTOLERANCE (04/04/2007), Gross hematuria (01/27/2009), HEMATOCHEZIA (07/26/2008), HYPERLIPIDEMIA (01/20/2007), HYPOGONADISM (06/13/2009), Impaired glucose tolerance (12/10/2010), INSOMNIA (04/04/2007), LIBIDO, DECREASED (1/61/0960), LICHEN SIMPLEX CHRONICUS (05/31/2008), LOW BACK PAIN (01/20/2007), OBSTRUCTIVE SLEEP APNEA (04/04/2007), OTITIS MEDIA, ACUTE, BILATERAL (06/13/2009), PANCREATITIS, HX OF (01/17/2007), PSA, INCREASED (11/18/2009), RASH-NONVESICULAR (08/26/2009), SINUSITIS- ACUTE-NOS (04/04/2007), SWEATING (09/30/2007), THRUSH (04/04/2007), TONSILLECTOMY AND ADENOIDECTOMY, HX OF (01/17/2007), URI (07/25/2010), URINARY RETENTION (01/27/2009), and VERTIGO (06/13/2009).  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  Cathlean Cower, MD 06/25/2021 6:04 PM [facility]

## 2021-06-21 NOTE — Patient Instructions (Signed)
Please take all new medication as recommended  - otc advil or tylenol as needed  Please stop at the first floor for the appt for Sports medicine  Please continue all other medications as before, and refills have been done if requested.  Please have the pharmacy call with any other refills you may need.  Please keep your appointments with your specialists as you may have planned  Please go to the XRAY Department in the first floor for the x-ray testing  You will be contacted by phone if any changes need to be made immediately.  Otherwise, you will receive a letter about your results with an explanation, but please check with MyChart first.  Please remember to sign up for MyChart if you have not done so, as this will be important to you in the future with finding out test results, communicating by private email, and scheduling acute appointments online when needed.  We'll see you back in march as planned

## 2021-06-22 ENCOUNTER — Ambulatory Visit: Payer: PPO | Attending: Internal Medicine

## 2021-06-22 DIAGNOSIS — R262 Difficulty in walking, not elsewhere classified: Secondary | ICD-10-CM | POA: Diagnosis not present

## 2021-06-22 DIAGNOSIS — M25651 Stiffness of right hip, not elsewhere classified: Secondary | ICD-10-CM

## 2021-06-22 DIAGNOSIS — M25551 Pain in right hip: Secondary | ICD-10-CM | POA: Diagnosis not present

## 2021-06-22 NOTE — Therapy (Signed)
OUTPATIENT PHYSICAL THERAPY TREATMENT NOTE Progress Note Reporting Period 05/24/21 to 06/26/21  See note below for Objective Data and Assessment of Progress/Goals.      Patient Name: Paul Tucker MRN: [id number] DOB:02/26/1951, 71 y.o., male Today's Date: 06/26/2021  PCP: Biagio Borg, MD REFERRING PROVIDER: Biagio Borg, MD   PT End of Session - 06/26/21 0929     Visit Number 10    Number of Visits 13    Date for PT Re-Evaluation 07/08/21    Authorization Type [employer]    PT Start Time 0930    PT Stop Time 1014    PT Time Calculation (min) 44 min    Activity Tolerance Patient tolerated treatment well    Behavior During Therapy [facility] for tasks assessed/performed                     Past Medical History:  Diagnosis Date   Acute prostatitis 04/04/2007   ADD 01/17/2007   Anxiety    DEPRESSION 01/17/2007   Treatment Resident Depression   Dermatophytosis of groin and perianal area 05/31/2008   DIVERTICULOSIS, COLON 04/06/2007   Dysuria 01/27/2009   FLANK PAIN, LEFT 09/30/2007   Flushing 08/26/2009   GLUCOSE INTOLERANCE 04/04/2007   Gross hematuria 01/27/2009   HEMATOCHEZIA 07/26/2008   HYPERLIPIDEMIA 01/20/2007   HYPOGONADISM 06/13/2009   Impaired glucose tolerance 12/10/2010   INSOMNIA 04/04/2007   LIBIDO, DECREASED 09/14/621   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   LOW BACK PAIN 01/20/2007   OBSTRUCTIVE SLEEP APNEA 04/04/2007   OTITIS MEDIA, ACUTE, BILATERAL 06/13/2009   PANCREATITIS, HX OF 01/17/2007   PSA, INCREASED 11/18/2009   RASH-NONVESICULAR 08/26/2009   SINUSITIS- ACUTE-NOS 04/04/2007   SWEATING 09/30/2007   THRUSH 04/04/2007   TONSILLECTOMY AND ADENOIDECTOMY, HX OF 01/17/2007   URI 07/25/2010   URINARY RETENTION 01/27/2009   VERTIGO 06/13/2009   Past Surgical History:  Procedure Laterality Date   CHOLECYSTECTOMY  2005   HEMORRHOID BANDING  2015   HERNIA REPAIR  1957   INGUINAL HERNIA REPAIR Right 07/27/2020   Procedure: RIGHT INGUINAL HERNIA REPAIR WITH  MESH;  Surgeon: Erroll Luna, MD;  Location: [place];  Service: General;  Laterality: Right;   [place]   Patient Active Problem List   Diagnosis Date Noted   Right leg pain 06/25/2021   Right inguinal hernia 06/09/2020   Vitamin D deficiency 07/25/2019   Elevated PSA 07/23/2019   Abnormal urinalysis 07/23/2019   Weight loss 07/19/2017   Decrease in appetite 07/19/2017   Anxiety 09/11/2016   Microalbuminuria 03/13/2016   Allergic rhinitis 09/13/2015   Allergic conjunctivitis 09/13/2015   B12 deficiency 08/14/2013   Internal hemorrhoids with other complication 76/28/3151   Fecal incontinence 11/12/2012   Dyspepsia and other specified disorders of function of stomach 11/12/2012   Right hip pain 06/11/2012   Dizziness 11/19/2011   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[place]) 76/16/0737   Encounter for well adult exam with abnormal findings 09/10/2010   PSA, INCREASED 11/18/2009   HYPOGONADISM 06/13/2009   Gross hematuria 01/27/2009   URINARY RETENTION 01/27/2009   LIBIDO, DECREASED 12/02/2008   Dermatophytosis of groin and perianal area 10/62/6948   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   Diverticulosis of large intestine 04/06/2007   OBSTRUCTIVE SLEEP APNEA 04/04/2007   INSOMNIA 04/04/2007   Hyperlipidemia 01/20/2007   GERD 01/20/2007   LOW BACK PAIN 01/20/2007   Depression 01/17/2007   Attention deficit disorder 01/17/2007   PANCREATITIS, HX  OF 01/17/2007   TONSILLECTOMY AND ADENOIDECTOMY, HX OF 01/17/2007    REFERRING DIAG: M76.30 (ICD-10-CM) - Iliotibial band syndrome, unspecified laterality   THERAPY DIAG:  Pain in right hip  Difficulty in walking, not elsewhere classified  Stiffness of right hip, not elsewhere classified  PERTINENT HISTORY: N/A  PRECAUTIONS: None   SUBJECTIVE:  Patient reports the hip is feeling good right now without pain. He reports soreness on Friday and Saturday in the hip attributed to his PT session on  Thursday. He has not completed walking over the weekend. He has appt with sports med tomorrow. He reports subjective overall improvement of 25% stating that it is more the unknown of how the hip would feel if he walked for a longer period.  PAIN:  Are you having pain? No VAS scale: 0/10 Pain location: N/A PAIN TYPE: N/A  Pain description: N/A   Aggravating factors: N/A   Relieving factors: N/A       OBJECTIVE:  Unless otherwise noted all objective measures were obtained on initial evaluation.  PATIENT SURVEYS:  FOTO 72% function and predicted 06/12/21: 63% function  06/26/21: 65%   MUSCLE LENGTH: Hamstring 90/90: Right lacking 50 deg; Left lacking 35 deg; 06/01/21 Right lacking 41, Left lacking 26; 06/15/21 Left lacking 25 Right lacking 30; 06/26/21: left lacking 25, Right lacking 30  Thomas test: (+) RLE for iliopsoas and rectus femoris.  Piriformis: moderate tightness bilaterally    POSTURE:  WNL   PALPATION: TTP Rt glute med/max and piriformis.  Symmetrical pelvic alignment in supine and standing.   06/19/21: Rt ASIS and iliac crest elevated in standing and supine  06/22/21: symmetrical pelvic alignment    LE AROM/PROM:  Rt hip P/AROM WNL, pain at end range of flexion, ER, and IR. Lumbar AROM WNL with patient reporting low back pain will all planes of movement, though no hip pain reported.    LE MMT:   MMT Right 05/24/2021 Left 05/24/2021 06/22/21 06/26/21  Hip flexion 4+ 5  5/5 bilateral   Hip extension 4- (pain) 4-  4+/5 bilateral   Hip abduction 4-(pain) 4+ Right 4-/5 concordant pain 4/5 Rt lateral hip pain; Lt 4+/5  Hip adduction 5 5    Hip internal rotation 5 5    Hip external rotation 5 5     (Blank rows = not tested)   LOWER EXTREMITY SPECIAL TESTS:  Ober's: (+) RLE FABER: (+) RLE FADIR: (+ for pain) RLE Scour: (-)  Mccumbers Sit: (-)  SLR: (-)   06/19/21: (+) Bouska sit RLE longer    FUNCTIONAL TESTS:  SLS: 3 sec RLE, 10 sec LLE; trendelenburg sign bilaterally; 06/26/21: 3  sec RLE no pelvic drop, 5 sec LLE no pelvic drop  Double Leg Squat: bilateral foot ER, excessive anterior tibial translation; 06/26/21: WNL  GAIT: Distance walked: 20 ft Assistive device utilized: None Level of assistance: Complete Independence Comments: WBOS, trendelenburg sign     TODAY'S TREATMENT: [facility] Adult PT Treatment:                                                DATE: 06/26/21 Therapeutic Exercise: Treadmill level 1.3 x 5 minutes Sidelying hip circles 2 x 10 CW/CCW bilateral Forward Step ups 10 inch 2 x 10  Lateral step ups 10 inch 2 x 10  Discussed walking progression as part of HEP  Therapeutic Activity: Education on re-assessment findings, progress towards goals.     [facility] Adult PT Treatment:                                                DATE: 06/22/21 Therapeutic Exercise: Treadmill 1.3 x 5 minutes  Sidelying hip abduction RLE 2 x 10  Sidelying leg taps fwd/bwd 2 x 10 bilaterally  Resisted hip flexion on cybex 2 x 10 @ 17.5 lbs  Leg press with green band around thighs for abductor activation 1 x 10 @ 20 lbs, 1 x 10 @ 40 lbs  Squat to chair 10 lb kettlebell 1 x 10  Updated HEP  Manual therapy: DTM to Rt glute med/min/max, piriformis, TFL     [facility] Adult PT Treatment:                                                DATE: 06/19/21 Therapeutic Exercise: Treadmill level 1.3 x 10 minutes  Treadmill level 1.3 x 10 minutes (following manual therapy)  Lateral band walks green band 3 sets d/b at counter  Lateral step ups 2 x 10 8 inch step bilateral Forward step up to knee driver 2 x 10 bilateral  Reviewed HEP    Manual: Muscle energy technique to improve pelvic symmetry    [facility] Adult PT Treatment:                                                DATE: 06/15/21 Therapeutic Exercise: NuStep level 6 x 5 minutes  Standing march 2 x 10  Leg press 2 x 10 @ 60 lbs Resisted hip flexion on cybex 2 x 10 bilateral @ 17.5 lbs  Lateral band walks green band at shins 3 sets d/b  at counter  Forward step ups 8 inch 2 x 10 bilateral  Updated HEP   Neuromuscular re-ed: Tandem 30 sec each Tandem eyes closed 10 sec each Semi-tandem eyes closed 2 x 30 sec each    [facility] Adult PT Treatment:                                                DATE: 06/12/21 Therapeutic Exercise: NuStep level 6 x 5 minutes UE/LE  Figure 4 30 sec RLE SKC 30 sec RLE  Hamstring stretch 30 sec RLE IT band stretch 30 sec RLE Hip bridge with ball squeeze 2 x 10  Clamshells blue band 2 x 10 bilateral  3 way hip 2 x 10 bilateral occasional single UE support  Standing march 2 x 10  Reviewed and updated HEP      06/08/2021: NuStep L6 x 5 min with UE/LE while taking subjective Bridge with blue band at knees 2 x 10 with 5 sec hold Side clamshell with blue 2 x 10 Sit to stand holding 15# at chest 2 x 10 Bent over hip extension with green band at knees 2 x 10 each - tactile cueing to engage gluetals  Runner step-up on 8" box with contralateral UE support 2 x 10 each Tandem stance 3 x 30 sec each Resisted side stepping with FM 10# holding handle at chest 2 x 3 each Row with FM 13# 2 x 10    [facility] Adult PT Treatment:                                                DATE: 06/05/21 Therapeutic Exercise: NuStep level 5 x 5 minutes  Prone quad stretch 30 seconds RLE Lunge hip flexor stretch 30 seconds RLE Prone hip extension x 5 bilateral Prone glute squeeze x 5  Quadruped leg extension 2  x10  Sidelying hip abduction 2 x 10 bilateral  Sit to stand 2 x 10  Step up 8 inch 1 x 10 bilateral  Lateral band walk at counter 2 sets d/b green band at shins Standing march 2 x 12 Tandem balance 1 x 30 sec each  Updated HEP with new Access Code: [email or web address] to determine if it would count stretch duration appropriately.          PATIENT EDUCATION:  Education details: see treatment  Person educated: Patient  Education method: Explanation Education comprehension: verbalized understanding  HOME  EXERCISE PROGRAM: Access Code: [id number]    ASSESSMENT: CLINICAL IMPRESSION:   Patient is making steady functional progress since beginning PT having met all short term functional goals and partially met 1/3 [place] term functional goals. He has not completed much outdoor walking since beginning PT to truly assess improvement in his initial chief complaint, which was 7/10 lateral hip pain that began within 10 minutes of recreational outdoor walking. With trials of short distance treadmill walking in clinic he reports mild lateral hip pain rated as 4/10 after 10 minute bouts. He demonstrates improved Rt hip strength, though continues to have pain and weakness with resisted hip abduction. He demonstrates improved lumbopelvic control during SLS bilaterally, though continues to have difficulty with maintaining balance (Rt>Lt). He will benefit from continuing with current POC to address lingering strength and balance deficits as well as encouraged to begin walking for recreation as part of HEP in order to return to PLOF.    GOALS: SHORT TERM GOALS:   STG Name Target Date Goal status  1 Patient will be independent and compliant with initial HEP.    Baseline: issued at eval 06/07/2021 Achieved   2 Patient will improve Rt hamstring length by 10 degrees to reduce overall stress on his hip with recreational activity.  Baseline: lacking 50  06/14/2021 Achieved     [place] TERM GOALS:    LTG Name Target Date Goal status  1 Patient will maintain SLS on the RLE for at least 5 seconds without hip drop to improve stability with community ambulation.  Baseline: 07/05/2021 Ongoing   2 Patient will demonstrate at least 4+/5 pain free Rt hip abductor and extensor strength to improve stability about the chain with prolonged walking and standing.  Baseline: 07/05/2021 Partially met  3 Patient will tolerate at least 30 minutes of walking with </=2/10 hip pain Baseline:10 minutes, pain 7/10; 1/30: 10 minutes 4/10 pain (has  not tried completing walking for recreation outside of PT)  07/05/2021 Ongoing      PLAN: PT FREQUENCY: 2x/week   PT DURATION: 6 weeks   PLANNED INTERVENTIONS: Therapeutic exercises, Therapeutic activity, Neuro Muscular re-education, Balance  training, Gait training, Patient/Family education, Joint mobilization, Stair training, Dry Needling, Electrical stimulation, Cryotherapy, Moist heat, Taping, Ultrasound, Ionotophoresis 59m/ml Dexamethasone, and Manual therapy   PLAN FOR NEXT SESSION:  hip strengthening, hip stretching; treadmill;  SGwendolyn Grant PT, DPT, ATC 06/26/21 10:23 AM

## 2021-06-25 ENCOUNTER — Encounter: Payer: Self-pay | Admitting: Internal Medicine

## 2021-06-25 DIAGNOSIS — M79604 Pain in right leg: Secondary | ICD-10-CM | POA: Insufficient documentation

## 2021-06-25 NOTE — Assessment & Plan Note (Signed)
Lab Results  Component Value Date   LDLCALC 58 01/19/2021   Stable, pt to continue current statin lovastatin

## 2021-06-25 NOTE — Assessment & Plan Note (Signed)
Lab Results  Component Value Date   PSA 4.30 (H) 01/19/2021   PSA 4.36 (H) 07/12/2020   PSA 3.4 01/20/2020   Stable,  to f/u any worsening symptoms or concerns

## 2021-06-25 NOTE — Assessment & Plan Note (Signed)
Lab Results  Component Value Date   HGBA1C 5.7 01/19/2021   Stable, pt to continue current medical treatment - diet

## 2021-06-25 NOTE — Assessment & Plan Note (Signed)
Upper lateral, doubt bursitis and much more likely to be IT band related though not well improving with PT, will continue to follow

## 2021-06-25 NOTE — Assessment & Plan Note (Signed)
Lab Results  Component Value Date   VITAMINB12 233 01/19/2021   Low, to start oral replacement - b12 1000 mcg qd

## 2021-06-25 NOTE — Assessment & Plan Note (Signed)
Last vitamin D Lab Results  Component Value Date   VD25OH 38.17 01/19/2021   Low, to start oral replacement

## 2021-06-26 ENCOUNTER — Ambulatory Visit: Payer: PPO

## 2021-06-26 ENCOUNTER — Other Ambulatory Visit: Payer: Self-pay

## 2021-06-26 ENCOUNTER — Encounter: Payer: PPO | Admitting: Physical Therapy

## 2021-06-26 DIAGNOSIS — R262 Difficulty in walking, not elsewhere classified: Secondary | ICD-10-CM

## 2021-06-26 DIAGNOSIS — M25551 Pain in right hip: Secondary | ICD-10-CM | POA: Diagnosis not present

## 2021-06-26 DIAGNOSIS — M25651 Stiffness of right hip, not elsewhere classified: Secondary | ICD-10-CM

## 2021-06-26 NOTE — Progress Notes (Signed)
Subjective:    CC: R hip pain  I, Paul Tucker, LAT, ATC, am serving as scribe for Dr. Lynne Leader.  HPI: Pt is a 71 y/o male c/o R hip pain x 10 years, worsening over the last 2. Pt notes he hit his R hip on the church pew. Pt retired and now is fairly sedentary. He locates his pain to anterior and posterior aspect of the R hip.  He is currently attending PT and has already completed 9 PT sessions for this c/o. Pt has been seen by [facility] for his back pain.  Low back pain: yes- has a hx Radiating pain: no R hip mechanical symptoms: no Aggravating factors: walking 2+ blocks Treatments tried: PT currently, Voltaren gel, IBU  Diagnostic testing: R hip XR- 06/21/21;  Pertinent review of Systems: No fevers or chills  Relevant historical information: Significant anxiety about injections and MRIs.   Objective:    Vitals:   06/27/21 0908  BP: 132/80  Pulse: 70  SpO2: 96%   General: Well Developed, well nourished, and in no acute distress.   MSK: Right hip normal-appearing Normal hip motion some pain with flexion and internal rotation. Intact strength to flexion and extension. Hip abduction strength intact 5/5.  External rotation strength slightly diminished 4/5 Able to reproduce pain a bit with flexion and internal rotation.  Leg lengths are not equal.  Right leg is is about 1/2 cm shorter than the left leg.   Lab and Radiology Results  X-ray images L-spine obtained today personally and independently interpreted DDD L5-S1.  No acute fractures. Await formal radiology review  EXAM: DG HIP (WITH OR WITHOUT PELVIS) 2-3V RIGHT   COMPARISON:  None.   FINDINGS: Degenerative changes in the lower lumbar spine. Mild degenerative changes in the hips without loss of joint space. No fractures or dislocations. No other abnormalities.   IMPRESSION: Mild degenerative changes in the hips without loss of joint space. Degenerative changes in the lower lumbar  spine.     Electronically Signed   By: Dorise Bullion III M.D.   On: 06/21/2021 17:48 I, Lynne Leader, personally (independently) visualized and performed the interpretation of the images attached in this note.   Impression and Recommendations:    Assessment and Plan: 71 y.o. male with right anterior hip pain thought to be likely related to intra-articular cause such as the mild hip DJD seen on x-ray or hip impingement or labrum tear.  He certainly has had enough physical therapy at this point to have a good trial of conservative management.  He does have pain is bothersome enough and interfering with his quality of life.  For example he cannot walk more than about 2 city blocks before he starts having a fair amount of pain that interferes with his ability to walk further without limping.  At this point I think the next step should be an MRI arthrogram of the right hip to further evaluate source of pain.  However he has a fair amount of anxiety around the claustrophobia of an MRI and the injection for the arthrogram so would like to think about it.  Certainly he would benefit from Ativan prescription before this procedure..  For now we will try to treat the leg length discrepancy by putting a small heel lift in the right shoe  Additionally obtain x-ray L-spine to look for potential L2 or 3 lumbar radiculopathy and he will notify me in a few weeks about what he would like to  do.  Recommend MRI arthrogram.  PDMP not reviewed this encounter. Orders Placed This Encounter  Procedures   DG Lumbar Spine 2-3 Views    Standing Status:   Future    Number of Occurrences:   1    Standing Expiration Date:   07/25/2021    Order Specific Question:   Reason for Exam (SYMPTOM  OR DIAGNOSIS REQUIRED)    Answer:   R hip pain    Order Specific Question:   Preferred imaging location?    Answer:   [name]   No orders of the defined types were placed in this encounter.   Discussed warning signs  or symptoms. Please see discharge instructions. Patient expresses understanding.   The above documentation has been reviewed and is accurate and complete Lynne Leader, M.D.

## 2021-06-27 ENCOUNTER — Ambulatory Visit: Payer: PPO | Admitting: Family Medicine

## 2021-06-27 ENCOUNTER — Ambulatory Visit (INDEPENDENT_AMBULATORY_CARE_PROVIDER_SITE_OTHER): Payer: PPO

## 2021-06-27 ENCOUNTER — Ambulatory Visit: Payer: Self-pay

## 2021-06-27 VITALS — BP 132/80 | HR 70 | Ht 73.0 in | Wt 213.0 lb

## 2021-06-27 DIAGNOSIS — M2170 Unequal limb length (acquired), unspecified site: Secondary | ICD-10-CM | POA: Diagnosis not present

## 2021-06-27 DIAGNOSIS — M25551 Pain in right hip: Secondary | ICD-10-CM

## 2021-06-27 DIAGNOSIS — M545 Low back pain, unspecified: Secondary | ICD-10-CM | POA: Diagnosis not present

## 2021-06-27 DIAGNOSIS — G8929 Other chronic pain: Secondary | ICD-10-CM

## 2021-06-27 NOTE — Patient Instructions (Addendum)
Thank you for coming in today.   Please get an Xray today before you leave.  Use a heel lift in your R shoe.  Let me know if you want to do the MRI arthrogram.  You can call the office or send a MyChart message.  Follow-up: as needed

## 2021-06-28 NOTE — Progress Notes (Signed)
Lumbar spine x-ray shows some arthritis changes at L4-L5. However we do not see any significant abnormalities at L2 or 3 which could cause anterior hip pain.

## 2021-06-28 NOTE — Therapy (Signed)
OUTPATIENT PHYSICAL THERAPY TREATMENT NOTE      Patient Name: Paul Tucker MRN: [id number] DOB:1950/08/18, 71 y.o., male Today's Date: 06/29/2021  PCP: Biagio Borg, MD REFERRING PROVIDER: Biagio Borg, MD   PT End of Session - 06/29/21 1012     Visit Number 11    Number of Visits 13    Date for PT Re-Evaluation 07/08/21    Authorization Type [employer]    PT Start Time 1015    PT Stop Time 1056    PT Time Calculation (min) 41 min    Activity Tolerance Patient tolerated treatment well    Behavior During Therapy [facility] for tasks assessed/performed                      Past Medical History:  Diagnosis Date   Acute prostatitis 04/04/2007   ADD 01/17/2007   Anxiety    DEPRESSION 01/17/2007   Treatment Resident Depression   Dermatophytosis of groin and perianal area 05/31/2008   DIVERTICULOSIS, COLON 04/06/2007   Dysuria 01/27/2009   FLANK PAIN, LEFT 09/30/2007   Flushing 08/26/2009   GLUCOSE INTOLERANCE 04/04/2007   Gross hematuria 01/27/2009   HEMATOCHEZIA 07/26/2008   HYPERLIPIDEMIA 01/20/2007   HYPOGONADISM 06/13/2009   Impaired glucose tolerance 12/10/2010   INSOMNIA 04/04/2007   LIBIDO, DECREASED 09/14/8339   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   LOW BACK PAIN 01/20/2007   OBSTRUCTIVE SLEEP APNEA 04/04/2007   OTITIS MEDIA, ACUTE, BILATERAL 06/13/2009   PANCREATITIS, HX OF 01/17/2007   PSA, INCREASED 11/18/2009   RASH-NONVESICULAR 08/26/2009   SINUSITIS- ACUTE-NOS 04/04/2007   SWEATING 09/30/2007   THRUSH 04/04/2007   TONSILLECTOMY AND ADENOIDECTOMY, HX OF 01/17/2007   URI 07/25/2010   URINARY RETENTION 01/27/2009   VERTIGO 06/13/2009   Past Surgical History:  Procedure Laterality Date   CHOLECYSTECTOMY  2005   HEMORRHOID BANDING  2015   HERNIA REPAIR  1957   INGUINAL HERNIA REPAIR Right 07/27/2020   Procedure: RIGHT INGUINAL HERNIA REPAIR WITH MESH;  Surgeon: Erroll Luna, MD;  Location: [place];  Service: General;  Laterality: Right;   [place]   Patient Active Problem List   Diagnosis Date Noted   Leg length discrepancy 06/27/2021   Right leg pain 06/25/2021   Right inguinal hernia 06/09/2020   Vitamin D deficiency 07/25/2019   Elevated PSA 07/23/2019   Abnormal urinalysis 07/23/2019   Weight loss 07/19/2017   Decrease in appetite 07/19/2017   Anxiety 09/11/2016   Microalbuminuria 03/13/2016   Allergic rhinitis 09/13/2015   Allergic conjunctivitis 09/13/2015   B12 deficiency 08/14/2013   Internal hemorrhoids with other complication 96/22/2979   Fecal incontinence 11/12/2012   Dyspepsia and other specified disorders of function of stomach 11/12/2012   Right hip pain 06/11/2012   Dizziness 11/19/2011   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   Encounter for well adult exam with abnormal findings 09/10/2010   PSA, INCREASED 11/18/2009   HYPOGONADISM 06/13/2009   Gross hematuria 01/27/2009   URINARY RETENTION 01/27/2009   LIBIDO, DECREASED 12/02/2008   Dermatophytosis of groin and perianal area 74/12/1446   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   Diverticulosis of large intestine 04/06/2007   OBSTRUCTIVE SLEEP APNEA 04/04/2007   INSOMNIA 04/04/2007   Hyperlipidemia 01/20/2007   GERD 01/20/2007   LOW BACK PAIN 01/20/2007   Depression 01/17/2007   Attention deficit disorder 01/17/2007   PANCREATITIS, HX OF 01/17/2007   TONSILLECTOMY AND ADENOIDECTOMY, HX OF 01/17/2007  REFERRING DIAG: M76.30 (ICD-10-CM) - Iliotibial band syndrome, unspecified laterality   THERAPY DIAG:  Pain in right hip  Difficulty in walking, not elsewhere classified  Stiffness of right hip, not elsewhere classified  PERTINENT HISTORY: N/A  PRECAUTIONS: None   SUBJECTIVE:  Patient reports he was able to walk 3 different times yesterday for about 10 minute durations. The third walk felt the best with pain rated as 2/10. He had appointment with sports med earlier this week who discussed potential need for  MRA, but patient is not interested in undergoing this at the time. He received a heel lift and has been wearing this consistently.  PAIN:  Are you having pain? No VAS scale: 0/10 Pain location: N/A PAIN TYPE: N/A  Pain description: N/A   Aggravating factors: N/A   Relieving factors: N/A       OBJECTIVE:  Unless otherwise noted all objective measures were obtained on initial evaluation.  PATIENT SURVEYS:  FOTO 72% function and predicted 06/12/21: 63% function  06/26/21: 65%   MUSCLE LENGTH: Hamstring 90/90: Right lacking 50 deg; Left lacking 35 deg; 06/01/21 Right lacking 41, Left lacking 26; 06/15/21 Left lacking 25 Right lacking 30; 06/26/21: left lacking 25, Right lacking 30  Thomas test: (+) RLE for iliopsoas and rectus femoris.  Piriformis: moderate tightness bilaterally    POSTURE:  WNL   PALPATION: TTP Rt glute med/max and piriformis.  Symmetrical pelvic alignment in supine and standing.   06/19/21: Rt ASIS and iliac crest elevated in standing and supine  06/22/21: symmetrical pelvic alignment    LE AROM/PROM:  Rt hip P/AROM WNL, pain at end range of flexion, ER, and IR. Lumbar AROM WNL with patient reporting low back pain will all planes of movement, though no hip pain reported.    LE MMT:   MMT Right 05/24/2021 Left 05/24/2021 06/22/21 06/26/21  Hip flexion 4+ 5  5/5 bilateral   Hip extension 4- (pain) 4-  4+/5 bilateral   Hip abduction 4-(pain) 4+ Right 4-/5 concordant pain 4/5 Rt lateral hip pain; Lt 4+/5  Hip adduction 5 5    Hip internal rotation 5 5    Hip external rotation 5 5     (Blank rows = not tested)   LOWER EXTREMITY SPECIAL TESTS:  Ober's: (+) RLE FABER: (+) RLE FADIR: (+ for pain) RLE Scour: (-)  Willis Sit: (-)  SLR: (-)   06/19/21: (+) Bartunek sit RLE longer    FUNCTIONAL TESTS:  SLS: 3 sec RLE, 10 sec LLE; trendelenburg sign bilaterally; 06/26/21: 3 sec RLE no pelvic drop, 5 sec LLE no pelvic drop  Double Leg Squat: bilateral foot ER, excessive anterior  tibial translation; 06/26/21: WNL  GAIT: Distance walked: 20 ft Assistive device utilized: None Level of assistance: Complete Independence Comments: WBOS, trendelenburg sign     TODAY'S TREATMENT: [facility] Adult PT Treatment:                                                DATE: 06/29/21 Therapeutic Exercise: Treadmill x 5 minutes level 1.3  Hip bridge with abduction 2 x 10 black  Sidelying hip abduction 2 x 15; 2 lbs bilateral Lateral step ups 2 inch with airex 2 x 10 bilateral  Forward step ups 2 inch with airex 2 x 10 bilateral  Walking high knees with pole in LUE to assist with  balance 2 x 20 ft  Discussed walking progression as part of HEP building up to 15-20 minutes with proper footwear    [facility] Adult PT Treatment:                                                DATE: 06/26/21 Therapeutic Exercise: Treadmill level 1.3 x 5 minutes Sidelying hip circles 2 x 10 CW/CCW bilateral Forward Step ups 10 inch 2 x 10  Lateral step ups 10 inch 2 x 10  Discussed walking progression as part of HEP  Therapeutic Activity: Education on re-assessment findings, progress towards goals.     [facility] Adult PT Treatment:                                                DATE: 06/22/21 Therapeutic Exercise: Treadmill 1.3 x 5 minutes  Sidelying hip abduction RLE 2 x 10  Sidelying leg taps fwd/bwd 2 x 10 bilaterally  Resisted hip flexion on cybex 2 x 10 @ 17.5 lbs  Leg press with green band around thighs for abductor activation 1 x 10 @ 20 lbs, 1 x 10 @ 40 lbs  Squat to chair 10 lb kettlebell 1 x 10  Updated HEP  Manual therapy: DTM to Rt glute med/min/max, piriformis, TFL     [facility] Adult PT Treatment:                                                DATE: 06/19/21 Therapeutic Exercise: Treadmill level 1.3 x 10 minutes  Treadmill level 1.3 x 10 minutes (following manual therapy)  Lateral band walks green band 3 sets d/b at counter  Lateral step ups 2 x 10 8 inch step bilateral Forward step up to knee  driver 2 x 10 bilateral  Reviewed HEP    Manual: Muscle energy technique to improve pelvic symmetry    [facility] Adult PT Treatment:                                                DATE: 06/15/21 Therapeutic Exercise: NuStep level 6 x 5 minutes  Standing march 2 x 10  Leg press 2 x 10 @ 60 lbs Resisted hip flexion on cybex 2 x 10 bilateral @ 17.5 lbs  Lateral band walks green band at shins 3 sets d/b at counter  Forward step ups 8 inch 2 x 10 bilateral  Updated HEP   Neuromuscular re-ed: Tandem 30 sec each Tandem eyes closed 10 sec each Semi-tandem eyes closed 2 x 30 sec each    [facility] Adult PT Treatment:                                                DATE: 06/12/21 Therapeutic Exercise: NuStep level 6 x 5 minutes UE/LE  Figure 4 30 sec RLE [facility]  30 sec RLE  Hamstring stretch 30 sec RLE IT band stretch 30 sec RLE Hip bridge with ball squeeze 2 x 10  Clamshells blue band 2 x 10 bilateral  3 way hip 2 x 10 bilateral occasional single UE support  Standing march 2 x 10  Reviewed and updated HEP      06/08/2021: NuStep L6 x 5 min with UE/LE while taking subjective Bridge with blue band at knees 2 x 10 with 5 sec hold Side clamshell with blue 2 x 10 Sit to stand holding 15# at chest 2 x 10 Bent over hip extension with green band at knees 2 x 10 each - tactile cueing to engage gluetals  Runner step-up on 8" box with contralateral UE support 2 x 10 each Tandem stance 3 x 30 sec each Resisted side stepping with FM 10# holding handle at chest 2 x 3 each Row with FM 13# 2 x 10    [facility] Adult PT Treatment:                                                DATE: 06/05/21 Therapeutic Exercise: NuStep level 5 x 5 minutes  Prone quad stretch 30 seconds RLE Lunge hip flexor stretch 30 seconds RLE Prone hip extension x 5 bilateral Prone glute squeeze x 5  Quadruped leg extension 2  x10  Sidelying hip abduction 2 x 10 bilateral  Sit to stand 2 x 10  Step up 8 inch 1 x 10 bilateral  Lateral  band walk at counter 2 sets d/b green band at shins Standing march 2 x 12 Tandem balance 1 x 30 sec each  Updated HEP with new Access Code: [email or web address] to determine if it would count stretch duration appropriately.          PATIENT EDUCATION:  Education details: see treatment  Person educated: Patient  Education method: Explanation Education comprehension: verbalized understanding  HOME EXERCISE PROGRAM: Access Code: [id number]    ASSESSMENT: CLINICAL IMPRESSION:   Patient reports being able to complete short duration outdoor walking since last session with minimal hip pain. During today's session he was able to complete 5 minutes of treadmill walking without onset of hip pain. Continued to progress hip strengthening with addition of standing strengthening on unstable surface with patient demonstrating improved postural stability. Minor posterolateral hip pain reported with walking high knees, otherwise no complaints of pain throughout session.    GOALS: SHORT TERM GOALS:   STG Name Target Date Goal status  1 Patient will be independent and compliant with initial HEP.    Baseline: issued at eval 06/07/2021 Achieved   2 Patient will improve Rt hamstring length by 10 degrees to reduce overall stress on his hip with recreational activity.  Baseline: lacking 50  06/14/2021 Achieved     [place] TERM GOALS:    LTG Name Target Date Goal status  1 Patient will maintain SLS on the RLE for at least 5 seconds without hip drop to improve stability with community ambulation.  Baseline: 07/05/2021 Ongoing   2 Patient will demonstrate at least 4+/5 pain free Rt hip abductor and extensor strength to improve stability about the chain with prolonged walking and standing.  Baseline: 07/05/2021 Partially met  3 Patient will tolerate at least 30 minutes of walking with </=2/10 hip pain Baseline:10 minutes, pain 7/10; 1/30: 10 minutes 4/10  pain (has not tried completing walking for recreation outside of  PT)  07/05/2021 Ongoing      PLAN: PT FREQUENCY: 2x/week   PT DURATION: 6 weeks   PLANNED INTERVENTIONS: Therapeutic exercises, Therapeutic activity, Neuro Muscular re-education, Balance training, Gait training, Patient/Family education, Joint mobilization, Stair training, Dry Needling, Electrical stimulation, Cryotherapy, Moist heat, Taping, Ultrasound, Ionotophoresis 37m/ml Dexamethasone, and Manual therapy   PLAN FOR NEXT SESSION:  hip strengthening, hip stretching; treadmill;  SGwendolyn Grant PT, DPT, ATC 06/29/21 11:53 AM

## 2021-06-29 ENCOUNTER — Other Ambulatory Visit: Payer: Self-pay

## 2021-06-29 ENCOUNTER — Ambulatory Visit: Payer: PPO

## 2021-06-29 DIAGNOSIS — M25651 Stiffness of right hip, not elsewhere classified: Secondary | ICD-10-CM

## 2021-06-29 DIAGNOSIS — R262 Difficulty in walking, not elsewhere classified: Secondary | ICD-10-CM

## 2021-06-29 DIAGNOSIS — M25551 Pain in right hip: Secondary | ICD-10-CM | POA: Diagnosis not present

## 2021-06-29 NOTE — Therapy (Incomplete)
OUTPATIENT PHYSICAL THERAPY TREATMENT NOTE      Patient Name: Paul Tucker MRN: [id number] DOB:1950-06-02, 71 y.o., male Today's Date: 06/29/2021  PCP: Biagio Borg, MD REFERRING PROVIDER: Biagio Borg, MD              Past Medical History:  Diagnosis Date   Acute prostatitis 04/04/2007   ADD 01/17/2007   Anxiety    DEPRESSION 01/17/2007   Treatment Resident Depression   Dermatophytosis of groin and perianal area 05/31/2008   DIVERTICULOSIS, COLON 04/06/2007   Dysuria 01/27/2009   FLANK PAIN, LEFT 09/30/2007   Flushing 08/26/2009   GLUCOSE INTOLERANCE 04/04/2007   Gross hematuria 01/27/2009   HEMATOCHEZIA 07/26/2008   HYPERLIPIDEMIA 01/20/2007   HYPOGONADISM 06/13/2009   Impaired glucose tolerance 12/10/2010   INSOMNIA 04/04/2007   LIBIDO, DECREASED 2/50/5397   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   LOW BACK PAIN 01/20/2007   OBSTRUCTIVE SLEEP APNEA 04/04/2007   OTITIS MEDIA, ACUTE, BILATERAL 06/13/2009   PANCREATITIS, HX OF 01/17/2007   PSA, INCREASED 11/18/2009   RASH-NONVESICULAR 08/26/2009   SINUSITIS- ACUTE-NOS 04/04/2007   SWEATING 09/30/2007   THRUSH 04/04/2007   TONSILLECTOMY AND ADENOIDECTOMY, HX OF 01/17/2007   URI 07/25/2010   URINARY RETENTION 01/27/2009   VERTIGO 06/13/2009   Past Surgical History:  Procedure Laterality Date   CHOLECYSTECTOMY  2005   HEMORRHOID BANDING  2015   HERNIA REPAIR  1957   INGUINAL HERNIA REPAIR Right 07/27/2020   Procedure: RIGHT INGUINAL HERNIA REPAIR WITH MESH;  Surgeon: Erroll Luna, MD;  Location: [facility];  Service: General;  Laterality: Right;   [place]   Patient Active Problem List   Diagnosis Date Noted   Leg length discrepancy 06/27/2021   Right leg pain 06/25/2021   Right inguinal hernia 06/09/2020   Vitamin D deficiency 07/25/2019   Elevated PSA 07/23/2019   Abnormal urinalysis 07/23/2019   Weight loss 07/19/2017   Decrease in appetite 07/19/2017   Anxiety 09/11/2016   Microalbuminuria  03/13/2016   Allergic rhinitis 09/13/2015   Allergic conjunctivitis 09/13/2015   B12 deficiency 08/14/2013   Internal hemorrhoids with other complication 67/34/1937   Fecal incontinence 11/12/2012   Dyspepsia and other specified disorders of function of stomach 11/12/2012   Right hip pain 06/11/2012   Dizziness 11/19/2011   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[place]) 90/24/0973   Encounter for well adult exam with abnormal findings 09/10/2010   PSA, INCREASED 11/18/2009   HYPOGONADISM 06/13/2009   Gross hematuria 01/27/2009   URINARY RETENTION 01/27/2009   LIBIDO, DECREASED 12/02/2008   Dermatophytosis of groin and perianal area 53/29/9242   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   Diverticulosis of large intestine 04/06/2007   OBSTRUCTIVE SLEEP APNEA 04/04/2007   INSOMNIA 04/04/2007   Hyperlipidemia 01/20/2007   GERD 01/20/2007   LOW BACK PAIN 01/20/2007   Depression 01/17/2007   Attention deficit disorder 01/17/2007   PANCREATITIS, HX OF 01/17/2007   TONSILLECTOMY AND ADENOIDECTOMY, HX OF 01/17/2007    REFERRING DIAG: M76.30 (ICD-10-CM) - Iliotibial band syndrome, unspecified laterality   THERAPY DIAG:  No diagnosis found.  PERTINENT HISTORY: N/A  PRECAUTIONS: None   SUBJECTIVE:  Patient reports he was able to walk 3 different times yesterday for about 10 minute durations. The third walk felt the best with pain rated as 2/10. He had appointment with sports med earlier this week who discussed potential need for MRA, but patient is not interested in undergoing this at the time. He received a heel  lift and has been wearing this consistently.  PAIN:  Are you having pain? No VAS scale: 0/10 Pain location: N/A PAIN TYPE: N/A  Pain description: N/A   Aggravating factors: N/A   Relieving factors: N/A       OBJECTIVE:  Unless otherwise noted all objective measures were obtained on initial evaluation.  PATIENT SURVEYS:  FOTO 72% function and predicted 06/12/21: 63%  function  06/26/21: 65%   MUSCLE LENGTH: Hamstring 90/90: Right lacking 50 deg; Left lacking 35 deg; 06/01/21 Right lacking 41, Left lacking 26; 06/15/21 Left lacking 25 Right lacking 30; 06/26/21: left lacking 25, Right lacking 30  Thomas test: (+) RLE for iliopsoas and rectus femoris.  Piriformis: moderate tightness bilaterally    POSTURE:  WNL   PALPATION: TTP Rt glute med/max and piriformis.  Symmetrical pelvic alignment in supine and standing.   06/19/21: Rt ASIS and iliac crest elevated in standing and supine  06/22/21: symmetrical pelvic alignment    LE AROM/PROM:  Rt hip P/AROM WNL, pain at end range of flexion, ER, and IR. Lumbar AROM WNL with patient reporting low back pain will all planes of movement, though no hip pain reported.    LE MMT:   MMT Right 05/24/2021 Left 05/24/2021 06/22/21 06/26/21  Hip flexion 4+ 5  5/5 bilateral   Hip extension 4- (pain) 4-  4+/5 bilateral   Hip abduction 4-(pain) 4+ Right 4-/5 concordant pain 4/5 Rt lateral hip pain; Lt 4+/5  Hip adduction 5 5    Hip internal rotation 5 5    Hip external rotation 5 5     (Blank rows = not tested)   LOWER EXTREMITY SPECIAL TESTS:  Ober's: (+) RLE FABER: (+) RLE FADIR: (+ for pain) RLE Scour: (-)  Lanni Sit: (-)  SLR: (-)   06/19/21: (+) Yazdi sit RLE longer    FUNCTIONAL TESTS:  SLS: 3 sec RLE, 10 sec LLE; trendelenburg sign bilaterally; 06/26/21: 3 sec RLE no pelvic drop, 5 sec LLE no pelvic drop  Double Leg Squat: bilateral foot ER, excessive anterior tibial translation; 06/26/21: WNL  GAIT: Distance walked: 20 ft Assistive device utilized: None Level of assistance: Complete Independence Comments: WBOS, trendelenburg sign     TODAY'S TREATMENT: [facility] Adult PT Treatment:                                                DATE: 06/29/21 Therapeutic Exercise: Treadmill x 5 minutes level 1.3  Hip bridge with abduction 2 x 10 black  Sidelying hip abduction 2 x 15; 2 lbs bilateral Lateral step ups 2 inch with  airex 2 x 10 bilateral  Forward step ups 2 inch with airex 2 x 10 bilateral  Walking high knees with pole in LUE to assist with balance 2 x 20 ft  Discussed walking progression as part of HEP building up to 15-20 minutes with proper footwear    [facility] Adult PT Treatment:                                                DATE: 06/26/21 Therapeutic Exercise: Treadmill level 1.3 x 5 minutes Sidelying hip circles 2 x 10 CW/CCW bilateral Forward Step ups 10 inch 2 x 10  Lateral step ups 10 inch 2 x 10  Discussed walking progression as part of HEP  Therapeutic Activity: Education on re-assessment findings, progress towards goals.     [facility] Adult PT Treatment:                                                DATE: 06/22/21 Therapeutic Exercise: Treadmill 1.3 x 5 minutes  Sidelying hip abduction RLE 2 x 10  Sidelying leg taps fwd/bwd 2 x 10 bilaterally  Resisted hip flexion on cybex 2 x 10 @ 17.5 lbs  Leg press with green band around thighs for abductor activation 1 x 10 @ 20 lbs, 1 x 10 @ 40 lbs  Squat to chair 10 lb kettlebell 1 x 10  Updated HEP  Manual therapy: DTM to Rt glute med/min/max, piriformis, TFL     [facility] Adult PT Treatment:                                                DATE: 06/19/21 Therapeutic Exercise: Treadmill level 1.3 x 10 minutes  Treadmill level 1.3 x 10 minutes (following manual therapy)  Lateral band walks green band 3 sets d/b at counter  Lateral step ups 2 x 10 8 inch step bilateral Forward step up to knee driver 2 x 10 bilateral  Reviewed HEP    Manual: Muscle energy technique to improve pelvic symmetry    [facility] Adult PT Treatment:                                                DATE: 06/15/21 Therapeutic Exercise: NuStep level 6 x 5 minutes  Standing march 2 x 10  Leg press 2 x 10 @ 60 lbs Resisted hip flexion on cybex 2 x 10 bilateral @ 17.5 lbs  Lateral band walks green band at shins 3 sets d/b at counter  Forward step ups 8 inch 2 x 10 bilateral   Updated HEP   Neuromuscular re-ed: Tandem 30 sec each Tandem eyes closed 10 sec each Semi-tandem eyes closed 2 x 30 sec each    [facility] Adult PT Treatment:                                                DATE: 06/12/21 Therapeutic Exercise: NuStep level 6 x 5 minutes UE/LE  Figure 4 30 sec RLE SKC 30 sec RLE  Hamstring stretch 30 sec RLE IT band stretch 30 sec RLE Hip bridge with ball squeeze 2 x 10  Clamshells blue band 2 x 10 bilateral  3 way hip 2 x 10 bilateral occasional single UE support  Standing march 2 x 10  Reviewed and updated HEP      06/08/2021: NuStep L6 x 5 min with UE/LE while taking subjective Bridge with blue band at knees 2 x 10 with 5 sec hold Side clamshell with blue 2 x 10 Sit to stand holding 15# at chest 2 x 10 Bent over  hip extension with green band at knees 2 x 10 each - tactile cueing to engage gluetals  Runner step-up on 8" box with contralateral UE support 2 x 10 each Tandem stance 3 x 30 sec each Resisted side stepping with FM 10# holding handle at chest 2 x 3 each Row with FM 13# 2 x 10    [facility] Adult PT Treatment:                                                DATE: 06/05/21 Therapeutic Exercise: NuStep level 5 x 5 minutes  Prone quad stretch 30 seconds RLE Lunge hip flexor stretch 30 seconds RLE Prone hip extension x 5 bilateral Prone glute squeeze x 5  Quadruped leg extension 2  x10  Sidelying hip abduction 2 x 10 bilateral  Sit to stand 2 x 10  Step up 8 inch 1 x 10 bilateral  Lateral band walk at counter 2 sets d/b green band at shins Standing march 2 x 12 Tandem balance 1 x 30 sec each  Updated HEP with new Access Code: [email or web address] to determine if it would count stretch duration appropriately.          PATIENT EDUCATION:  Education details: see treatment  Person educated: Patient  Education method: Explanation Education comprehension: verbalized understanding  HOME EXERCISE PROGRAM: Access Code: [id number]     ASSESSMENT: CLINICAL IMPRESSION:   Patient reports being able to complete short duration outdoor walking since last session with minimal hip pain. During today's session he was able to complete 5 minutes of treadmill walking without onset of hip pain. Continued to progress hip strengthening with addition of standing strengthening on unstable surface with patient demonstrating improved postural stability. Minor posterolateral hip pain reported with walking high knees, otherwise no complaints of pain throughout session.    GOALS: SHORT TERM GOALS:   STG Name Target Date Goal status  1 Patient will be independent and compliant with initial HEP.    Baseline: issued at eval 06/07/2021 Achieved   2 Patient will improve Rt hamstring length by 10 degrees to reduce overall stress on his hip with recreational activity.  Baseline: lacking 50  06/14/2021 Achieved     [place] TERM GOALS:    LTG Name Target Date Goal status  1 Patient will maintain SLS on the RLE for at least 5 seconds without hip drop to improve stability with community ambulation.  Baseline: 07/05/2021 Ongoing   2 Patient will demonstrate at least 4+/5 pain free Rt hip abductor and extensor strength to improve stability about the chain with prolonged walking and standing.  Baseline: 07/05/2021 Partially met  3 Patient will tolerate at least 30 minutes of walking with </=2/10 hip pain Baseline:10 minutes, pain 7/10; 1/30: 10 minutes 4/10 pain (has not tried completing walking for recreation outside of PT)  07/05/2021 Ongoing      PLAN: PT FREQUENCY: 2x/week   PT DURATION: 6 weeks   PLANNED INTERVENTIONS: Therapeutic exercises, Therapeutic activity, Neuro Muscular re-education, Balance training, Gait training, Patient/Family education, Joint mobilization, Stair training, Dry Needling, Electrical stimulation, Cryotherapy, Moist heat, Taping, Ultrasound, Ionotophoresis 39m/ml Dexamethasone, and Manual therapy   PLAN FOR NEXT SESSION:   hip strengthening, hip stretching; treadmill;  SGwendolyn Grant PT, DPT, ATC 06/29/21 2:32 PM

## 2021-07-03 ENCOUNTER — Ambulatory Visit: Payer: PPO

## 2021-07-05 DIAGNOSIS — G2119 Other drug induced secondary parkinsonism: Secondary | ICD-10-CM | POA: Diagnosis not present

## 2021-07-05 DIAGNOSIS — F3342 Major depressive disorder, recurrent, in full remission: Secondary | ICD-10-CM | POA: Diagnosis not present

## 2021-07-05 NOTE — Therapy (Signed)
OUTPATIENT PHYSICAL THERAPY TREATMENT NOTE   PHYSICAL THERAPY DISCHARGE SUMMARY  Visits from Start of Care: 12  Current functional level related to goals / functional outcomes: See goals below   Remaining deficits: See impression   Education / Equipment: See treatment    Patient agrees to discharge. Patient goals were partially met. Patient is being discharged due to maximized rehab potential.     Patient Name: Paul Tucker MRN: [id number] DOB:[birth year], 71 y.o., male Today's Date: 07/06/2021  PCP: Biagio Borg, MD REFERRING PROVIDER: Biagio Borg, MD   PT End of Session - 07/06/21 0929     Visit Number 12    Number of Visits 13    Date for PT Re-Evaluation 07/08/21    Authorization Type [employer]    PT Start Time 0930    PT Stop Time 0959    PT Time Calculation (min) 29 min    Activity Tolerance Patient tolerated treatment well    Behavior During Therapy [facility] for tasks assessed/performed                       Past Medical History:  Diagnosis Date   Acute prostatitis 04/04/2007   ADD 01/17/2007   Anxiety    DEPRESSION 01/17/2007   Treatment Resident Depression   Dermatophytosis of groin and perianal area 05/31/2008   DIVERTICULOSIS, COLON 04/06/2007   Dysuria 01/27/2009   FLANK PAIN, LEFT 09/30/2007   Flushing 08/26/2009   GLUCOSE INTOLERANCE 04/04/2007   Gross hematuria 01/27/2009   HEMATOCHEZIA 07/26/2008   HYPERLIPIDEMIA 01/20/2007   HYPOGONADISM 06/13/2009   Impaired glucose tolerance 12/10/2010   INSOMNIA 04/04/2007   LIBIDO, DECREASED 3/57/0177   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   LOW BACK PAIN 01/20/2007   OBSTRUCTIVE SLEEP APNEA 04/04/2007   OTITIS MEDIA, ACUTE, BILATERAL 06/13/2009   PANCREATITIS, HX OF 01/17/2007   PSA, INCREASED 11/18/2009   RASH-NONVESICULAR 08/26/2009   SINUSITIS- ACUTE-NOS 04/04/2007   SWEATING 09/30/2007   THRUSH 04/04/2007   TONSILLECTOMY AND ADENOIDECTOMY, HX OF 01/17/2007   URI 07/25/2010   URINARY RETENTION  01/27/2009   VERTIGO 06/13/2009   Past Surgical History:  Procedure Laterality Date   CHOLECYSTECTOMY  2005   HEMORRHOID BANDING  2015   HERNIA REPAIR  1957   INGUINAL HERNIA REPAIR Right 07/27/2020   Procedure: RIGHT INGUINAL HERNIA REPAIR WITH MESH;  Surgeon: Erroll Luna, MD;  Location: [place];  Service: General;  Laterality: Right;   [place]   Patient Active Problem List   Diagnosis Date Noted   Leg length discrepancy 06/27/2021   Right leg pain 06/25/2021   Right inguinal hernia 06/09/2020   Vitamin D deficiency 07/25/2019   Elevated PSA 07/23/2019   Abnormal urinalysis 07/23/2019   Weight loss 07/19/2017   Decrease in appetite 07/19/2017   Anxiety 09/11/2016   Microalbuminuria 03/13/2016   Allergic rhinitis 09/13/2015   Allergic conjunctivitis 09/13/2015   B12 deficiency 08/14/2013   Internal hemorrhoids with other complication 93/90/3009   Fecal incontinence 11/12/2012   Dyspepsia and other specified disorders of function of stomach 11/12/2012   Right hip pain 06/11/2012   Dizziness 11/19/2011   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[place]) 23/30/0762   Encounter for well adult exam with abnormal findings 09/10/2010   PSA, INCREASED 11/18/2009   HYPOGONADISM 06/13/2009   Gross hematuria 01/27/2009   URINARY RETENTION 01/27/2009   LIBIDO, DECREASED 12/02/2008   Dermatophytosis of groin and perianal area 05/31/2008  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   Diverticulosis of large intestine 04/06/2007   OBSTRUCTIVE SLEEP APNEA 04/04/2007   INSOMNIA 04/04/2007   Hyperlipidemia 01/20/2007   GERD 01/20/2007   LOW BACK PAIN 01/20/2007   Depression 01/17/2007   Attention deficit disorder 01/17/2007   PANCREATITIS, HX OF 01/17/2007   TONSILLECTOMY AND ADENOIDECTOMY, HX OF 01/17/2007    REFERRING DIAG: M76.30 (ICD-10-CM) - Iliotibial band syndrome, unspecified laterality   THERAPY DIAG:  Pain in right hip  Difficulty in walking, not  elsewhere classified  Stiffness of right hip, not elsewhere classified  PERTINENT HISTORY: N/A  PRECAUTIONS: None   SUBJECTIVE:  Patient reports the hip is "fair." He reports he has gotten to over 10 minutes of walking (about 1/2 mile) reporting some pain in the hip, but it's not as bad as it was rating it as a 3/10. He reports subjective overall improvement of 50%, "it's a good bit better."  PAIN:  Are you having pain? No VAS scale: 0/10 Pain location: N/A PAIN TYPE: N/A  Pain description: N/A   Aggravating factors: N/A   Relieving factors: N/A       OBJECTIVE:  Unless otherwise noted all objective measures were obtained on initial evaluation.  PATIENT SURVEYS:  FOTO 72% function and predicted 06/12/21: 63% function  06/26/21: 65% 07/06/21: 67%   MUSCLE LENGTH: Hamstring 90/90: Right lacking 50 deg; Left lacking 35 deg; 06/01/21 Right lacking 41, Left lacking 26; 06/15/21 Left lacking 25 Right lacking 30; 06/26/21: left lacking 25, Right lacking 30;  07/06/21:Right lacking 26 Left lacking 25  Thomas test: (+) RLE for iliopsoas and rectus femoris.   Piriformis: moderate tightness bilaterally. 07/06/21: minimal tightness bilaterally     PALPATION: TTP Rt glute med/max and piriformis.  Symmetrical pelvic alignment in supine and standing.   06/19/21: Rt ASIS and iliac crest elevated in standing and supine  06/22/21: symmetrical pelvic alignment   07/06/21: no palpable tenderness about Rt hip    LE AROM/PROM:  Rt hip P/AROM WNL, pain at end range of flexion, ER, and IR. Lumbar AROM WNL with patient reporting low back pain will all planes of movement, though no hip pain reported.   07/06/21: Rt hip P/AROM WNL, pain at end range IR   LE MMT:   MMT Right 05/24/2021 Left 05/24/2021 06/22/21 06/26/21 07/06/21  Hip flexion 4+ 5  5/5 bilateral  5/5 bilateral   Hip extension 4- (pain) 4-  4+/5 bilateral  4+/5 bilateral; pain free   Hip abduction 4-(pain) 4+ Right 4-/5 concordant pain 4/5 Rt  lateral hip pain; Lt 4+/5 4+/5 bilateral; pain free  Hip adduction 5 5   5/5 bilateral  Hip internal rotation 5 5   5/5 bilateral  Hip external rotation 5 5   5/5 bilateral    (Blank rows = not tested)   LOWER EXTREMITY SPECIAL TESTS:  Ober's: (+) RLE FABER: (+) RLE FADIR: (+ for pain) RLE Scour: (-)  [place] Sit: (-)  SLR: (-)   06/19/21: (+) Sookdeo sit RLE longer   07/06/21: (+) FABER (+ for pain) FADIR (-) Ober's (-) Scour    FUNCTIONAL TESTS:  SLS: 3 sec RLE, 10 sec LLE; trendelenburg sign bilaterally; 06/26/21: 3 sec RLE no pelvic drop, 5 sec LLE no pelvic drop; 07/06/21: RLE 5 sec no pelvic drop, LLE 6 sec no pelvic drop  Double Leg Squat: bilateral foot ER, excessive anterior tibial translation; 06/26/21: WNL  GAIT: Distance walked: 20 ft Assistive device utilized: None Level of assistance: Complete  Independence Comments: WBOS, trendelenburg sign    TODAY'S TREATMENT: [facility] Adult PT Treatment:                                                DATE: 07/06/21 Therapeutic Exercise: Reviewed/updated HEP and discussed appropriate progression of walking program working towards 30 minutes of walking.   Therapeutic Activity: Re-assessment to determine overall progress, educating patient on re-assessment findings and progress towards goals. D/C education.   [facility] Adult PT Treatment:                                                DATE: 06/29/21 Therapeutic Exercise: Treadmill x 5 minutes level 1.3  Hip bridge with abduction 2 x 10 black  Sidelying hip abduction 2 x 15; 2 lbs bilateral Lateral step ups 2 inch with airex 2 x 10 bilateral  Forward step ups 2 inch with airex 2 x 10 bilateral  Walking high knees with pole in LUE to assist with balance 2 x 20 ft  Discussed walking progression as part of HEP building up to 15-20 minutes with proper footwear    [facility] Adult PT Treatment:                                                DATE: 06/26/21 Therapeutic Exercise: Treadmill level 1.3 x 5  minutes Sidelying hip circles 2 x 10 CW/CCW bilateral Forward Step ups 10 inch 2 x 10  Lateral step ups 10 inch 2 x 10  Discussed walking progression as part of HEP  Therapeutic Activity: Education on re-assessment findings, progress towards goals.    PATIENT EDUCATION:  Education details: see treatment and impression Person educated: Patient  Education method: Explanation Education comprehension: verbalized understanding  HOME EXERCISE PROGRAM: Access Code: [id number]    ASSESSMENT: CLINICAL IMPRESSION:   Patient has progressed well throughout duration of care reporting an overall reduction in his hip pain and ability to tolerate walking for longer distances prior to onset of hip pain. He demonstrates improvements in balance, strength, and flexibility since the start of care and has met the majority of established functional goals. He demonstrates independence with advanced home program and verbalizes understanding of continuing with walking program to further progress his strength and endurance. It was recommended that if he does not continue to note improvement in his hip pain while adhering to advanced HEP and walking program to f/u with sports medicine for further testing that was recommended at his recent appointment. Patient verbalized understanding and is appropriate for discharge at this time.    GOALS: SHORT TERM GOALS:   STG Name Target Date Goal status  1 Patient will be independent and compliant with initial HEP.    Baseline: issued at eval 06/07/2021 Achieved   2 Patient will improve Rt hamstring length by 10 degrees to reduce overall stress on his hip with recreational activity.  Baseline: lacking 50  06/14/2021 Achieved     [place] TERM GOALS:    LTG Name Target Date Goal status  1 Patient will maintain SLS on the RLE for at least 5  seconds without hip drop to improve stability with community ambulation.  Baseline: 07/05/2021 Achieved    2 Patient will demonstrate at  least 4+/5 pain free Rt hip abductor and extensor strength to improve stability about the chain with prolonged walking and standing.  Baseline: 07/05/2021 Achieved  3 Patient will tolerate at least 30 minutes of walking with </=2/10 hip pain Baseline:10 minutes, pain 7/10; 07/06/21: 10 minutes 3/10 pain  07/05/2021 Ongoing      PLAN: PT FREQUENCY: 2x/week   PT DURATION: 6 weeks   PLANNED INTERVENTIONS: Therapeutic exercises, Therapeutic activity, Neuro Muscular re-education, Balance training, Gait training, Patient/Family education, Joint mobilization, Stair training, Dry Needling, Electrical stimulation, Cryotherapy, Moist heat, Taping, Ultrasound, Ionotophoresis 25m/ml Dexamethasone, and Manual therapy   PLAN FOR NEXT SESSION:  N/A discharge   SGwendolyn Grant PT, DPT, ATC 07/06/21 9:59 AM

## 2021-07-06 ENCOUNTER — Ambulatory Visit: Payer: PPO

## 2021-07-06 ENCOUNTER — Other Ambulatory Visit: Payer: Self-pay

## 2021-07-06 DIAGNOSIS — R262 Difficulty in walking, not elsewhere classified: Secondary | ICD-10-CM

## 2021-07-06 DIAGNOSIS — M25551 Pain in right hip: Secondary | ICD-10-CM | POA: Diagnosis not present

## 2021-07-06 DIAGNOSIS — M25651 Stiffness of right hip, not elsewhere classified: Secondary | ICD-10-CM

## 2021-07-18 ENCOUNTER — Other Ambulatory Visit (INDEPENDENT_AMBULATORY_CARE_PROVIDER_SITE_OTHER): Payer: PPO

## 2021-07-18 DIAGNOSIS — R972 Elevated prostate specific antigen [PSA]: Secondary | ICD-10-CM | POA: Diagnosis not present

## 2021-07-18 DIAGNOSIS — E1340 Other specified diabetes mellitus with diabetic neuropathy, unspecified: Secondary | ICD-10-CM | POA: Diagnosis not present

## 2021-07-18 DIAGNOSIS — E559 Vitamin D deficiency, unspecified: Secondary | ICD-10-CM | POA: Diagnosis not present

## 2021-07-18 DIAGNOSIS — E538 Deficiency of other specified B group vitamins: Secondary | ICD-10-CM

## 2021-07-18 LAB — CBC WITH DIFFERENTIAL/PLATELET
Basophils Absolute: 0 10*3/uL (ref 0.0–0.1)
Basophils Relative: 0.7 % (ref 0.0–3.0)
Eosinophils Absolute: 0.1 10*3/uL (ref 0.0–0.7)
Eosinophils Relative: 2.8 % (ref 0.0–5.0)
HCT: 38.8 % — ABNORMAL LOW (ref 39.0–52.0)
Hemoglobin: 13.7 g/dL (ref 13.0–17.0)
Lymphocytes Relative: 19.7 % (ref 12.0–46.0)
Lymphs Abs: 1.1 10*3/uL (ref 0.7–4.0)
MCHC: 35.3 g/dL (ref 30.0–36.0)
MCV: 97.2 fl (ref 78.0–100.0)
Monocytes Absolute: 0.3 10*3/uL (ref 0.1–1.0)
Monocytes Relative: 6.5 % (ref 3.0–12.0)
Neutro Abs: 3.8 10*3/uL (ref 1.4–7.7)
Neutrophils Relative %: 70.3 % (ref 43.0–77.0)
Platelets: 176 10*3/uL (ref 150.0–400.0)
RBC: 3.99 Mil/uL — ABNORMAL LOW (ref 4.22–5.81)
RDW: 12.3 % (ref 11.5–15.5)
WBC: 5.3 10*3/uL (ref 4.0–10.5)

## 2021-07-18 LAB — URINALYSIS, ROUTINE W REFLEX MICROSCOPIC
Bilirubin Urine: NEGATIVE
Hgb urine dipstick: NEGATIVE
Ketones, ur: NEGATIVE
Leukocytes,Ua: NEGATIVE
Nitrite: NEGATIVE
RBC / HPF: NONE SEEN (ref 0–?)
Specific Gravity, Urine: 1.025 (ref 1.000–1.030)
Total Protein, Urine: NEGATIVE
Urine Glucose: NEGATIVE
Urobilinogen, UA: 0.2 (ref 0.0–1.0)
pH: 5.5 (ref 5.0–8.0)

## 2021-07-18 LAB — MICROALBUMIN / CREATININE URINE RATIO
Creatinine,U: 150.7 mg/dL
Microalb Creat Ratio: 0.8 mg/g (ref 0.0–30.0)
Microalb, Ur: 1.2 mg/dL (ref 0.0–1.9)

## 2021-07-18 LAB — BASIC METABOLIC PANEL
BUN: 20 mg/dL (ref 6–23)
CO2: 24 mEq/L (ref 19–32)
Calcium: 9.8 mg/dL (ref 8.4–10.5)
Chloride: 104 mEq/L (ref 96–112)
Creatinine, Ser: 1.22 mg/dL (ref 0.40–1.50)
GFR: 60.36 mL/min (ref >60.00)
Glucose, Bld: 168 mg/dL — ABNORMAL HIGH (ref 70–99)
Potassium: 3.9 mEq/L (ref 3.5–5.1)
Sodium: 138 mEq/L (ref 135–145)

## 2021-07-18 LAB — LIPID PANEL
Cholesterol: 121 mg/dL (ref 0–200)
HDL: 45.4 mg/dL (ref >39.00)
LDL Cholesterol: 57 mg/dL (ref 0–99)
NonHDL: 76.08
Total CHOL/HDL Ratio: 3
Triglycerides: 96 mg/dL (ref 0.0–149.0)
VLDL: 19.2 mg/dL (ref 0.0–40.0)

## 2021-07-18 LAB — HEPATIC FUNCTION PANEL
ALT: 16 U/L (ref 0–53)
AST: 14 U/L (ref 0–37)
Albumin: 4.6 g/dL (ref 3.5–5.2)
Alkaline Phosphatase: 59 U/L (ref 39–117)
Bilirubin, Direct: 0.2 mg/dL (ref 0.0–0.3)
Total Bilirubin: 1.2 mg/dL (ref 0.2–1.2)
Total Protein: 7 g/dL (ref 6.0–8.3)

## 2021-07-18 LAB — VITAMIN D 25 HYDROXY (VIT D DEFICIENCY, FRACTURES): VITD: 64.03 ng/mL (ref 30.00–100.00)

## 2021-07-18 LAB — HEMOGLOBIN A1C: Hgb A1c MFr Bld: 6 % (ref 4.6–6.5)

## 2021-07-18 LAB — TSH: TSH: 1.33 u[IU]/mL (ref 0.35–5.50)

## 2021-07-18 LAB — VITAMIN B12: Vitamin B-12: 958 pg/mL — ABNORMAL HIGH (ref 211–911)

## 2021-07-18 LAB — PSA: PSA: 4.3 ng/mL — ABNORMAL HIGH (ref 0.10–4.00)

## 2021-07-28 ENCOUNTER — Ambulatory Visit (INDEPENDENT_AMBULATORY_CARE_PROVIDER_SITE_OTHER): Payer: PPO | Admitting: Internal Medicine

## 2021-07-28 ENCOUNTER — Encounter: Payer: Self-pay | Admitting: Internal Medicine

## 2021-07-28 ENCOUNTER — Other Ambulatory Visit: Payer: Self-pay

## 2021-07-28 VITALS — BP 118/70 | HR 73 | Temp 98.9°F | Ht 73.0 in | Wt 211.0 lb

## 2021-07-28 DIAGNOSIS — M5136 Other intervertebral disc degeneration, lumbar region: Secondary | ICD-10-CM

## 2021-07-28 DIAGNOSIS — E1340 Other specified diabetes mellitus with diabetic neuropathy, unspecified: Secondary | ICD-10-CM | POA: Diagnosis not present

## 2021-07-28 DIAGNOSIS — Z0000 Encounter for general adult medical examination without abnormal findings: Secondary | ICD-10-CM

## 2021-07-28 DIAGNOSIS — E7800 Pure hypercholesterolemia, unspecified: Secondary | ICD-10-CM

## 2021-07-28 DIAGNOSIS — F3289 Other specified depressive episodes: Secondary | ICD-10-CM | POA: Diagnosis not present

## 2021-07-28 DIAGNOSIS — E559 Vitamin D deficiency, unspecified: Secondary | ICD-10-CM

## 2021-07-28 DIAGNOSIS — E538 Deficiency of other specified B group vitamins: Secondary | ICD-10-CM

## 2021-07-28 MED ORDER — MELOXICAM 15 MG PO TABS: 15.0000 mg | ORAL_TABLET | Freq: Every day | ORAL | 3 refills | Status: DC | PRN

## 2021-07-28 MED ORDER — LOVASTATIN 40 MG PO TABS: 80.0000 mg | ORAL_TABLET | Freq: Every day | ORAL | 3 refills | Status: DC

## 2021-07-28 NOTE — Patient Instructions (Signed)
Please continue all other medications as before, and refills have been done if requested. ? ?Please have the pharmacy call with any other refills you may need. ? ?Please continue your efforts at being more active, low cholesterol diet, and weight control. ? ?You are otherwise up to date with prevention measures today. ? ?Please keep your appointments with your specialists as you may have planned ? ?You will be contacted regarding the referral for: psychiatry ? ?Please make an Appointment to return in 6 months, or sooner if needed, also with Lab Appointment for testing done 3-5 days before at the [facility] (so this is for TWO appointments - please see the scheduling desk as you leave) ? ?Due to the ongoing Covid 19 pandemic, our lab now requires an appointment for any labs done at our office.  If you need labs done and do not have an appointment, please call our office ahead of time to schedule before presenting to the lab for your testing. ? ? ?

## 2021-07-28 NOTE — Progress Notes (Signed)
Patient ID: Paul Tucker, male   DOB: 1951/02/02, 71 y.o.   MRN: [id number]         Chief Complaint:: wellness exam and hld, lbp, dm       HPI:  Paul Tucker is a 71 y.o. male here for wellness exam; decliens Tdap, o/w up to date                        Also Pt continues to have recurring LBP without change in severity, bowel or bladder change, fever, wt loss,  worsening LE pain/numbness/weakness, gait change or falls.  Pt denies chest pain, increased sob or doe, wheezing, orthopnea, PND, increased LE swelling, palpitations, dizziness or syncope.   Pt denies polydipsia, polyuria, or new focal neuro s/s.  Denies worsening depressive symptoms, suicidal ideation, or panic; has ongoing anxiety but wife and pt asks for referral to new psychiatry as Dr [facility] no longer accepts their insurance.     Wt Readings from Last 3 Encounters:  07/28/21 211 lb (95.7 kg)  06/27/21 213 lb (96.6 kg)  06/21/21 212 lb (96.2 kg)   BP Readings from Last 3 Encounters:  07/28/21 118/70  06/27/21 132/80  06/21/21 110/66   Immunization History  Administered Date(s) Administered   Fluad Quad(high Dose 65+) 02/28/2019, 03/09/2020, 03/03/2021   Influenza Whole 02/28/2006, 04/04/2007   Influenza, High Dose Seasonal PF 03/06/2017, 02/07/2018   Influenza,inj,Quad PF,6+ Mos 02/25/2014, 03/10/2015, 03/13/2016   Influenza-Unspecified 02/19/2012   PFIZER(Purple Top)SARS-COV-2 Vaccination 06/25/2019, 07/20/2019, 02/15/2020   Pfizer Covid-19 Vaccine Bivalent Booster 74yr & up 02/09/2021   Pneumococcal Conjugate-13 03/06/2017   Pneumococcal Polysaccharide-23 02/25/2014, 07/20/2020   Td 12/02/2008   Zoster Recombinat (Shingrix) 07/24/2018, 11/26/2018   Zoster, Live 12/04/2011  There are no preventive care reminders to display for this patient.    Past Medical History:  Diagnosis Date   Acute prostatitis 04/04/2007   ADD 01/17/2007   Anxiety    DEPRESSION 01/17/2007   Treatment Resident Depression   Dermatophytosis of  groin and perianal area 05/31/2008   DIVERTICULOSIS, COLON 04/06/2007   Dysuria 01/27/2009   FLANK PAIN, LEFT 09/30/2007   Flushing 08/26/2009   GLUCOSE INTOLERANCE 04/04/2007   Gross hematuria 01/27/2009   HEMATOCHEZIA 07/26/2008   HYPERLIPIDEMIA 01/20/2007   HYPOGONADISM 06/13/2009   Impaired glucose tolerance 12/10/2010   INSOMNIA 04/04/2007   LIBIDO, DECREASED 77/07/5007  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   LOW BACK PAIN 01/20/2007   OBSTRUCTIVE SLEEP APNEA 04/04/2007   OTITIS MEDIA, ACUTE, BILATERAL 06/13/2009   PANCREATITIS, HX OF 01/17/2007   PSA, INCREASED 11/18/2009   RASH-NONVESICULAR 08/26/2009   SINUSITIS- ACUTE-NOS 04/04/2007   SWEATING 09/30/2007   THRUSH 04/04/2007   TONSILLECTOMY AND ADENOIDECTOMY, HX OF 01/17/2007   URI 07/25/2010   URINARY RETENTION 01/27/2009   VERTIGO 06/13/2009   Past Surgical History:  Procedure Laterality Date   CHOLECYSTECTOMY  2005   HEMORRHOID BANDING  2015   HERNIA REPAIR  1957   INGUINAL HERNIA REPAIR Right 07/27/2020   Procedure: RIGHT INGUINAL HERNIA REPAIR WITH MESH;  Surgeon: CErroll Luna MD;  [place] [facility]  Service: General;  Laterality: Right;   [place]   reports that he has never smoked. He has never used smokeless tobacco. He reports that he does not drink alcohol and does not use drugs. family history includes Coronary artery disease in an other family member; Diabetes in an other family member; Emphysema in his  father; Heart disease in his father. Allergies  Allergen Reactions   Atrovent [Ipratropium] Swelling    Throat swelling   Pravastatin Sodium Nausea Only    Pt does not recall this reaction   Current Outpatient Medications on File Prior to Visit  Medication Sig Dispense Refill   amantadine (SYMMETREL) 100 MG capsule Take 100 mg by mouth 2 (two) times daily.     aspirin 81 MG EC tablet Take 81 mg by mouth daily.     Cholecalciferol (VITAMIN D3 PO) Take 4,000 Units by mouth.     Ciclopirox 0.77 % gel  USE AS DIRECTED TOPCIALLY 45 g 1   diclofenac Sodium (VOLTAREN) 1 % GEL Apply 4 g topically in the morning and at bedtime.     diphenhydrAMINE (BENADRYL) 25 MG tablet Take 25 mg by mouth every 6 (six) hours as needed for allergies.     fluocinonide cream (LIDEX) 2.58 % Apply 1 application topically 2 (two) times daily. 30 g 0   LORazepam (ATIVAN) 0.5 MG tablet Take 0.5 mg by mouth 2 (two) times daily as needed.     meclizine (ANTIVERT) 25 MG tablet TAKE ONE TABLET BY MOUTH THREE TIMES A DAY AS NEEDED FOR DIZZINESS 30 tablet 2   mometasone (ELOCON) 0.1 % cream Apply 1 application topically daily. 45 g 0   OLANZapine (ZYPREXA) 2.5 MG tablet Take 2.5 mg by mouth at bedtime.     tamsulosin (FLOMAX) 0.4 MG CAPS capsule TAKE ONE CAPSULE BY MOUTH DAILY 90 capsule 1   TRINTELLIX 20 MG TABS tablet Take 20 mg by mouth daily.     zolpidem (AMBIEN) 10 MG tablet TAKE ONE TABLET BY MOUTH EVERY NIGHT AT BEDTIME 90 tablet 1   No current facility-administered medications on file prior to visit.        ROS:  All others reviewed and negative.  Objective        PE:  BP 118/70 (BP Location: Right Arm, Patient Position: Sitting, Cuff Size: Large)    Pulse 73    Temp 98.9 F (37.2 C) (Oral)    Ht '6\' 1"'$  (1.854 m)    Wt 211 lb (95.7 kg)    SpO2 95%    BMI 27.84 kg/m                 Constitutional: Pt appears in NAD               HENT: Head: NCAT.                Right Ear: External ear normal.                 Left Ear: External ear normal.                Eyes: . Pupils are equal, round, and reactive to light. Conjunctivae and EOM are normal               Nose: without d/c or deformity               Neck: Neck supple. Gross normal ROM               Cardiovascular: Normal rate and regular rhythm.                 Pulmonary/Chest: Effort normal and breath sounds without rales or wheezing.                Abd:  Soft, NT, ND, + BS,  no organomegaly               Neurological: Pt is alert. At baseline orientation,  motor grossly intact               Skin: Skin is warm. No rashes, no other new lesions, LE edema - none               Psychiatric: Pt behavior is normal without agitation   Micro: none  Cardiac tracings I have personally interpreted today:  none  Pertinent Radiological findings (summarize): none   Lab Results  Component Value Date   WBC 5.3 07/18/2021   HGB 13.7 07/18/2021   HCT 38.8 (L) 07/18/2021   PLT 176.0 07/18/2021   GLUCOSE 168 (H) 07/18/2021   CHOL 121 07/18/2021   TRIG 96.0 07/18/2021   HDL 45.40 07/18/2021   LDLDIRECT 82.0 02/28/2017   LDLCALC 57 07/18/2021   ALT 16 07/18/2021   AST 14 07/18/2021   NA 138 07/18/2021   K 3.9 07/18/2021   CL 104 07/18/2021   CREATININE 1.22 07/18/2021   BUN 20 07/18/2021   CO2 24 07/18/2021   TSH 1.33 07/18/2021   PSA 4.30 (H) 07/18/2021   HGBA1C 6.0 07/18/2021   MICROALBUR 1.2 07/18/2021   Assessment/Plan:  ELMORE HYSLOP is a 71 y.o. White or Caucasian [1] male with  has a past medical history of Acute prostatitis (04/04/2007), ADD (01/17/2007), Anxiety, DEPRESSION (01/17/2007), Dermatophytosis of groin and perianal area (05/31/2008), DIVERTICULOSIS, COLON (04/06/2007), Dysuria (01/27/2009), FLANK PAIN, LEFT (09/30/2007), Flushing (08/26/2009), GLUCOSE INTOLERANCE (04/04/2007), Gross hematuria (01/27/2009), HEMATOCHEZIA (07/26/2008), HYPERLIPIDEMIA (01/20/2007), HYPOGONADISM (06/13/2009), Impaired glucose tolerance (12/10/2010), INSOMNIA (04/04/2007), LIBIDO, DECREASED (0/60/1561), LICHEN SIMPLEX CHRONICUS (05/31/2008), LOW BACK PAIN (01/20/2007), OBSTRUCTIVE SLEEP APNEA (04/04/2007), OTITIS MEDIA, ACUTE, BILATERAL (06/13/2009), PANCREATITIS, HX OF (01/17/2007), PSA, INCREASED (11/18/2009), RASH-NONVESICULAR (08/26/2009), SINUSITIS- ACUTE-NOS (04/04/2007), SWEATING (09/30/2007), THRUSH (04/04/2007), TONSILLECTOMY AND ADENOIDECTOMY, HX OF (01/17/2007), URI (07/25/2010), URINARY RETENTION (01/27/2009), and VERTIGO (06/13/2009).  Preventative health care Age and sex  appropriate education and counseling updated with regular exercise and diet Referrals for preventative services - none needed Immunizations addressed - declines tdap Smoking counseling  - none needed Evidence for depression or other mood disorder - chronic anxiety/depression - stable Most recent labs reviewed. I have personally reviewed and have noted: 1) the patient's medical and social history 2) The patient's current medications and supplements 3) The patient's height, weight, and BMI have been recorded in the chart   Lumbar degenerative disc disease Stable overall, for mobic 15 qd prn  Hyperlipidemia Lab Results  Component Value Date   LDLCALC 57 07/18/2021   Stable, pt to continue current statin lovastatin 80   Depression [place] for referal to new psychiatry with insurance acceptance  Followup: Return in about 6 months (around 01/28/2022).  Cathlean Cower, MD 07/29/2021 7:08 PM [facility]

## 2021-07-29 NOTE — Assessment & Plan Note (Addendum)
Lab Results  Component Value Date   LDLCALC 57 07/18/2021   Stable, pt to continue current statin lovastatin 80

## 2021-07-29 NOTE — Assessment & Plan Note (Signed)
[place] for referal to new psychiatry with insurance acceptance ?

## 2021-07-29 NOTE — Assessment & Plan Note (Signed)
Age and sex appropriate education and counseling updated with regular exercise and diet Referrals for preventative services - none needed Immunizations addressed - declines tdap Smoking counseling  - none needed Evidence for depression or other mood disorder - chronic anxiety/depression - stable Most recent labs reviewed. I have personally reviewed and have noted: 1) the patient's medical and social history 2) The patient's current medications and supplements 3) The patient's height, weight, and BMI have been recorded in the chart

## 2021-07-29 NOTE — Assessment & Plan Note (Signed)
Stable overall, for mobic 15 qd prn ?

## 2021-08-01 ENCOUNTER — Other Ambulatory Visit: Payer: Self-pay | Admitting: Internal Medicine

## 2021-09-01 DIAGNOSIS — R972 Elevated prostate specific antigen [PSA]: Secondary | ICD-10-CM | POA: Diagnosis not present

## 2021-09-03 NOTE — Progress Notes (Signed)
?Cardiology Office Note:   ? ?Date:  09/04/2021  ? ?ID:  Paul Tucker, DOB [birth year], MRN [id number] ? ?PCP:  Biagio Borg, MD  ?Cardiologist:  Donato Heinz, MD  ?Electrophysiologist:  None  ? ?Referring MD: Biagio Borg, MD  ? ?No chief complaint on file. ? ? ?History of Present Illness:   ? ?Paul Tucker is a 71 y.o. male with a hx of hyperlipidemia, ADHD, depression, OSA who presents for follow-up.  He was referred by Dr. Jenny Reichmann for evaluation of CAD, initially seen on 03/02/2021.  Underwent calcium score 02/09/2021, which was 1216 (89th percentile).  He had previous history of OSA but lost 50 pounds and is now off CPAP.  No smoking history.  Father had MI in 51s and had ICD. ? ?Lexiscan Myoview on 03/08/2021 showed normal perfusion, EF 63%. ? ?Since last clinic visit, he reports that he is doing well.  Denies any chest pain, dyspnea, lightheadedness, syncope, lower extremity edema, or palpitations.  Has not been exercising. ? ? ?Past Medical History:  ?Diagnosis Date  ? Acute prostatitis 04/04/2007  ? ADD 01/17/2007  ? Anxiety   ? DEPRESSION 01/17/2007  ? Treatment Resident Depression  ? Dermatophytosis of groin and perianal area 05/31/2008  ? DIVERTICULOSIS, COLON 04/06/2007  ? Dysuria 01/27/2009  ? FLANK PAIN, LEFT 09/30/2007  ? Flushing 08/26/2009  ? GLUCOSE INTOLERANCE 04/04/2007  ? Gross hematuria 01/27/2009  ? HEMATOCHEZIA 07/26/2008  ? HYPERLIPIDEMIA 01/20/2007  ? HYPOGONADISM 06/13/2009  ? Impaired glucose tolerance 12/10/2010  ? INSOMNIA 04/04/2007  ? LIBIDO, DECREASED 12/02/2008  ? 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  ? LOW BACK PAIN 01/20/2007  ? OBSTRUCTIVE SLEEP APNEA 04/04/2007  ? OTITIS MEDIA, ACUTE, BILATERAL 06/13/2009  ? PANCREATITIS, HX OF 01/17/2007  ? PSA, INCREASED 11/18/2009  ? RASH-NONVESICULAR 08/26/2009  ? SINUSITIS- ACUTE-NOS 04/04/2007  ? SWEATING 09/30/2007  ? THRUSH 04/04/2007  ? TONSILLECTOMY AND ADENOIDECTOMY, HX OF 01/17/2007  ? URI 07/25/2010  ? URINARY RETENTION 01/27/2009  ? VERTIGO 06/13/2009   ? ? ?Past Surgical History:  ?Procedure Laterality Date  ? CHOLECYSTECTOMY  2005  ? HEMORRHOID BANDING  2015  ? HERNIA REPAIR  1957  ? INGUINAL HERNIA REPAIR Right 07/27/2020  ? Procedure: RIGHT INGUINAL HERNIA REPAIR WITH MESH;  Surgeon: Erroll Luna, MD;  Location: [place];  Service: General;  Laterality: Right;  ? TONSILLECTOMY    ? VASECTOMY  1987  ? ? ?Current Medications: ?Current Meds  ?Medication Sig  ? amantadine (SYMMETREL) 100 MG capsule Take 100 mg by mouth 2 (two) times daily.  ? aspirin 81 MG EC tablet Take 81 mg by mouth daily.  ? Cholecalciferol (VITAMIN D3 PO) Take 4,000 Units by mouth.  ? Ciclopirox 0.77 % gel USE AS DIRECTED TOPCIALLY  ? diclofenac Sodium (VOLTAREN) 1 % GEL Apply 4 g topically in the morning and at bedtime.  ? diphenhydrAMINE (BENADRYL) 25 MG tablet Take 25 mg by mouth every 6 (six) hours as needed for allergies.  ? fluocinonide cream (LIDEX) 3.81 % Apply 1 application topically 2 (two) times daily.  ? LORazepam (ATIVAN) 0.5 MG tablet Take 0.5 mg by mouth 2 (two) times daily as needed.  ? lovastatin (MEVACOR) 40 MG tablet TAKE ONE TABLET BY MOUTH EVERY NIGHT AT BEDTIME  ? meclizine (ANTIVERT) 25 MG tablet TAKE ONE TABLET BY MOUTH THREE TIMES A DAY AS NEEDED FOR DIZZINESS  ? meloxicam (MOBIC) 15 MG tablet Take 1 tablet (15 mg total) by mouth daily as needed for  pain.  ? mometasone (ELOCON) 0.1 % cream Apply 1 application topically daily.  ? OLANZapine (ZYPREXA) 2.5 MG tablet Take 2.5 mg by mouth at bedtime.  ? tamsulosin (FLOMAX) 0.4 MG CAPS capsule TAKE ONE CAPSULE BY MOUTH DAILY  ? TRINTELLIX 20 MG TABS tablet Take 20 mg by mouth daily.  ? zolpidem (AMBIEN) 10 MG tablet TAKE ONE TABLET BY MOUTH EVERY NIGHT AT BEDTIME  ?  ? ?Allergies:   Atrovent [ipratropium] and Pravastatin sodium  ? ?Social History  ? ?Socioeconomic History  ? Marital status: Married  ?  Spouse name: Not on file  ? Number of children: 3  ? Years of education: Not on file  ? Highest education level: Not on file   ?Occupational History  ? Occupation: Brewing technologist  ?Tobacco Use  ? Smoking status: Never  ? Smokeless tobacco: Never  ?Vaping Use  ? Vaping Use: Never used  ?Substance and Sexual Activity  ? Alcohol use: Never  ?  Alcohol/week: 0.0 standard drinks  ? Drug use: No  ? Sexual activity: Not on file  ?Other Topics Concern  ? Not on file  ?Social History Narrative  ? Not on file  ? ?Social Determinants of Health  ? ?Financial Resource Strain: Low Risk   ? Difficulty of Paying Living Expenses: Not hard at all  ?Food Insecurity: No Food Insecurity  ? Worried About Charity fundraiser in the Last Year: Never true  ? Ran Out of Food in the Last Year: Never true  ?Transportation Needs: No Transportation Needs  ? Lack of Transportation (Medical): No  ? Lack of Transportation (Non-Medical): No  ?Physical Activity: Inactive  ? Days of Exercise per Week: 0 days  ? Minutes of Exercise per Session: 0 min  ?Stress: No Stress Concern Present  ? Feeling of Stress : Not at all  ?Social Connections: Socially Integrated  ? Frequency of Communication with Friends and Family: More than three times a week  ? Frequency of Social Gatherings with Friends and Family: More than three times a week  ? Attends Religious Services: More than 4 times per year  ? Active Member of Clubs or Organizations: Yes  ? Attends Archivist Meetings: More than 4 times per year  ? Marital Status: Married  ?  ? ?Family History: ?The patient's family history includes Coronary artery disease in an other family member; Diabetes in an other family member; Emphysema in his father; Heart disease in his father. There is no history of Colon cancer, Esophageal cancer, Rectal cancer, or Stomach cancer. ? ?ROS:   ?Please see the history of present illness.    ? All other systems reviewed and are negative. ? ?EKGs/Labs/Other Studies Reviewed:   ? ?The following studies were reviewed today: ? ? ?EKG:   ?09/04/2021: Normal sinus rhythm, rate 69, no ST  abnormalities ? ?Recent Labs: ?07/18/2021: ALT 16; BUN 20; Creatinine, Ser 1.22; Hemoglobin 13.7; Platelets 176.0; Potassium 3.9; Sodium 138; TSH 1.33  ?Recent Lipid Panel ?   ?Component Value Date/Time  ? CHOL 121 07/18/2021 0859  ? TRIG 96.0 07/18/2021 0859  ? HDL 45.40 07/18/2021 0859  ? CHOLHDL 3 07/18/2021 0859  ? VLDL 19.2 07/18/2021 0859  ? [place] 57 07/18/2021 0859  ? LDLDIRECT 82.0 02/28/2017 0725  ? ? ?Physical Exam:   ? ?VS:  BP 128/72   Pulse 69   Ht '6\' 1"'$  (1.854 m)   Wt 213 lb (96.6 kg)   SpO2 94%   BMI  28.10 kg/m?    ? ?Wt Readings from Last 3 Encounters:  ?09/04/21 213 lb (96.6 kg)  ?07/28/21 211 lb (95.7 kg)  ?06/27/21 213 lb (96.6 kg)  ?  ? ?GEN:  Well nourished, well developed in no acute distress ?HEENT: Normal ?NECK: No JVD; No carotid bruits ?LYMPHATICS: No lymphadenopathy ?CARDIAC: RRR, no murmurs, rubs, gallops ?RESPIRATORY:  Clear to auscultation without rales, wheezing or rhonchi  ?ABDOMEN: Soft, non-tender, non-distended ?MUSCULOSKELETAL:  No edema; No deformity  ?SKIN: Warm and dry ?NEUROLOGIC:  Alert and oriented x 3 ?PSYCHIATRIC:  Normal affect  ? ?ASSESSMENT:   ? ?1. Coronary artery disease involving native coronary artery of native heart without angina pectoris   ?2. Hyperlipidemia, unspecified hyperlipidemia type   ? ? ?PLAN:   ? ?CAD: Underwent calcium score 02/09/2021, which was 1216 (89th percentile).  Denies anginal symptoms.  Lexiscan Myoview on 03/08/2021 showed normal perfusion, EF 63%. ?-Continue aspirin 81 mg daily ?-Continue lovastatin 40 mg.  LDL 57, at goal <70 ? ?Hyperlipidemia: On lovastatin 40 mg daily.  LDL 57 on 07/18/2021 ? ?RTC in 1 year ? ? ? ?Medication Adjustments/Labs and Tests Ordered: ?Current medicines are reviewed at length with the patient today.  Concerns regarding medicines are outlined above.  ?Orders Placed This Encounter  ?Procedures  ? EKG 12-Lead  ? ? ?No orders of the defined types were placed in this encounter. ? ? ? ?Patient Instructions   ?Medication Instructions:  ?No changes ?*If you need a refill on your cardiac medications before your next appointment, please call your pharmacy* ? ? ?Lab Work: ?None ordered ?If you have labs (blood work) drawn today and you

## 2021-09-04 ENCOUNTER — Encounter: Payer: Self-pay | Admitting: Cardiology

## 2021-09-04 ENCOUNTER — Ambulatory Visit: Payer: PPO | Admitting: Cardiology

## 2021-09-04 VITALS — BP 128/72 | HR 69 | Ht 73.0 in | Wt 213.0 lb

## 2021-09-04 DIAGNOSIS — E785 Hyperlipidemia, unspecified: Secondary | ICD-10-CM

## 2021-09-04 DIAGNOSIS — I2510 Atherosclerotic heart disease of native coronary artery without angina pectoris: Secondary | ICD-10-CM | POA: Diagnosis not present

## 2021-09-04 NOTE — Patient Instructions (Signed)
Medication Instructions:  ?No changes ?*If you need a refill on your cardiac medications before your next appointment, please call your pharmacy* ? ? ?Lab Work: ?None ordered ?If you have labs (blood work) drawn today and your tests are completely normal, you will receive your results only by: ?MyChart Message (if you have MyChart) OR ?A paper copy in the mail ?If you have any lab test that is abnormal or we need to change your treatment, we will call you to review the results. ? ? ?Testing/Procedures: ?None ordered ? ? ?Follow-Up: ?At [facility], you and your health needs are our priority.  As part of our continuing mission to provide you with exceptional heart care, we have created designated Provider Care Teams.  These Care Teams include your primary Cardiologist (physician) and Advanced Practice Providers (APPs -  Physician Assistants and Nurse Practitioners) who all work together to provide you with the care you need, when you need it. ? ?We recommend signing up for the patient portal called "MyChart".  Sign up information is provided on this After Visit Summary.  MyChart is used to connect with patients for Virtual Visits (Telemedicine).  Patients are able to view lab/test results, encounter notes, upcoming appointments, etc.  Non-urgent messages can be sent to your provider as well.   ?To learn more about what you can do with MyChart, go to [email or web address].   ? ?Your next appointment:   ?12 month(s) ? ?The format for your next appointment:   ?In Person ? ?Provider:   ?Dr. Gardiner Rhyme ? ?Important Information About Sugar ? ? ? ? ? ? ?

## 2021-09-05 DIAGNOSIS — R3912 Poor urinary stream: Secondary | ICD-10-CM | POA: Diagnosis not present

## 2021-09-05 DIAGNOSIS — N401 Enlarged prostate with lower urinary tract symptoms: Secondary | ICD-10-CM | POA: Diagnosis not present

## 2021-09-05 DIAGNOSIS — R972 Elevated prostate specific antigen [PSA]: Secondary | ICD-10-CM | POA: Diagnosis not present

## 2021-09-12 ENCOUNTER — Encounter: Payer: Self-pay | Admitting: Internal Medicine

## 2021-09-12 MED ORDER — MELOXICAM 15 MG PO TABS: 15.0000 mg | ORAL_TABLET | Freq: Every day | ORAL | 3 refills | Status: DC | PRN

## 2021-11-06 ENCOUNTER — Other Ambulatory Visit: Payer: Self-pay | Admitting: Internal Medicine

## 2021-11-22 ENCOUNTER — Other Ambulatory Visit: Payer: Self-pay | Admitting: Internal Medicine

## 2021-12-14 DIAGNOSIS — H5203 Hypermetropia, bilateral: Secondary | ICD-10-CM | POA: Diagnosis not present

## 2021-12-14 DIAGNOSIS — H25813 Combined forms of age-related cataract, bilateral: Secondary | ICD-10-CM | POA: Diagnosis not present

## 2021-12-14 DIAGNOSIS — I10 Essential (primary) hypertension: Secondary | ICD-10-CM | POA: Diagnosis not present

## 2021-12-14 DIAGNOSIS — H52223 Regular astigmatism, bilateral: Secondary | ICD-10-CM | POA: Diagnosis not present

## 2021-12-14 DIAGNOSIS — D3132 Benign neoplasm of left choroid: Secondary | ICD-10-CM | POA: Diagnosis not present

## 2021-12-14 DIAGNOSIS — E119 Type 2 diabetes mellitus without complications: Secondary | ICD-10-CM | POA: Diagnosis not present

## 2021-12-14 DIAGNOSIS — H524 Presbyopia: Secondary | ICD-10-CM | POA: Diagnosis not present

## 2021-12-14 DIAGNOSIS — H35033 Hypertensive retinopathy, bilateral: Secondary | ICD-10-CM | POA: Diagnosis not present

## 2022-01-04 DIAGNOSIS — M9903 Segmental and somatic dysfunction of lumbar region: Secondary | ICD-10-CM | POA: Diagnosis not present

## 2022-01-04 DIAGNOSIS — M9902 Segmental and somatic dysfunction of thoracic region: Secondary | ICD-10-CM | POA: Diagnosis not present

## 2022-01-04 DIAGNOSIS — M9901 Segmental and somatic dysfunction of cervical region: Secondary | ICD-10-CM | POA: Diagnosis not present

## 2022-01-04 DIAGNOSIS — M25551 Pain in right hip: Secondary | ICD-10-CM | POA: Diagnosis not present

## 2022-01-09 DIAGNOSIS — M9901 Segmental and somatic dysfunction of cervical region: Secondary | ICD-10-CM | POA: Diagnosis not present

## 2022-01-09 DIAGNOSIS — M9903 Segmental and somatic dysfunction of lumbar region: Secondary | ICD-10-CM | POA: Diagnosis not present

## 2022-01-09 DIAGNOSIS — M25551 Pain in right hip: Secondary | ICD-10-CM | POA: Diagnosis not present

## 2022-01-09 DIAGNOSIS — M9902 Segmental and somatic dysfunction of thoracic region: Secondary | ICD-10-CM | POA: Diagnosis not present

## 2022-01-11 DIAGNOSIS — M25551 Pain in right hip: Secondary | ICD-10-CM | POA: Diagnosis not present

## 2022-01-11 DIAGNOSIS — M9903 Segmental and somatic dysfunction of lumbar region: Secondary | ICD-10-CM | POA: Diagnosis not present

## 2022-01-11 DIAGNOSIS — M9902 Segmental and somatic dysfunction of thoracic region: Secondary | ICD-10-CM | POA: Diagnosis not present

## 2022-01-11 DIAGNOSIS — M9901 Segmental and somatic dysfunction of cervical region: Secondary | ICD-10-CM | POA: Diagnosis not present

## 2022-01-15 DIAGNOSIS — M25551 Pain in right hip: Secondary | ICD-10-CM | POA: Diagnosis not present

## 2022-01-15 DIAGNOSIS — M9901 Segmental and somatic dysfunction of cervical region: Secondary | ICD-10-CM | POA: Diagnosis not present

## 2022-01-15 DIAGNOSIS — M9902 Segmental and somatic dysfunction of thoracic region: Secondary | ICD-10-CM | POA: Diagnosis not present

## 2022-01-15 DIAGNOSIS — M9903 Segmental and somatic dysfunction of lumbar region: Secondary | ICD-10-CM | POA: Diagnosis not present

## 2022-01-17 ENCOUNTER — Other Ambulatory Visit: Payer: Self-pay | Admitting: Internal Medicine

## 2022-01-18 ENCOUNTER — Encounter: Payer: Self-pay | Admitting: Internal Medicine

## 2022-01-18 DIAGNOSIS — M25551 Pain in right hip: Secondary | ICD-10-CM | POA: Diagnosis not present

## 2022-01-18 DIAGNOSIS — M9901 Segmental and somatic dysfunction of cervical region: Secondary | ICD-10-CM | POA: Diagnosis not present

## 2022-01-18 DIAGNOSIS — M9902 Segmental and somatic dysfunction of thoracic region: Secondary | ICD-10-CM | POA: Diagnosis not present

## 2022-01-18 DIAGNOSIS — M9903 Segmental and somatic dysfunction of lumbar region: Secondary | ICD-10-CM | POA: Diagnosis not present

## 2022-01-19 MED ORDER — FLUOCINONIDE 0.05 % EX CREA: 1.0000 | TOPICAL_CREAM | Freq: Two times a day (BID) | CUTANEOUS | 1 refills | Status: AC

## 2022-01-19 MED ORDER — CICLOPIROX 0.77 % EX GEL: CUTANEOUS | 1 refills | Status: AC

## 2022-01-23 ENCOUNTER — Other Ambulatory Visit (INDEPENDENT_AMBULATORY_CARE_PROVIDER_SITE_OTHER): Payer: PPO

## 2022-01-23 DIAGNOSIS — M9901 Segmental and somatic dysfunction of cervical region: Secondary | ICD-10-CM | POA: Diagnosis not present

## 2022-01-23 DIAGNOSIS — M25551 Pain in right hip: Secondary | ICD-10-CM | POA: Diagnosis not present

## 2022-01-23 DIAGNOSIS — E559 Vitamin D deficiency, unspecified: Secondary | ICD-10-CM

## 2022-01-23 DIAGNOSIS — E538 Deficiency of other specified B group vitamins: Secondary | ICD-10-CM | POA: Diagnosis not present

## 2022-01-23 DIAGNOSIS — E1340 Other specified diabetes mellitus with diabetic neuropathy, unspecified: Secondary | ICD-10-CM

## 2022-01-23 DIAGNOSIS — M9903 Segmental and somatic dysfunction of lumbar region: Secondary | ICD-10-CM | POA: Diagnosis not present

## 2022-01-23 DIAGNOSIS — M9902 Segmental and somatic dysfunction of thoracic region: Secondary | ICD-10-CM | POA: Diagnosis not present

## 2022-01-23 LAB — BASIC METABOLIC PANEL
BUN: 19 mg/dL (ref 6–23)
CO2: 22 mEq/L (ref 19–32)
Calcium: 9.4 mg/dL (ref 8.4–10.5)
Chloride: 104 mEq/L (ref 96–112)
Creatinine, Ser: 0.99 mg/dL (ref 0.40–1.50)
GFR: 77.27 mL/min (ref >60.00)
Glucose, Bld: 188 mg/dL — ABNORMAL HIGH (ref 70–99)
Potassium: 3.9 mEq/L (ref 3.5–5.1)
Sodium: 136 mEq/L (ref 135–145)

## 2022-01-23 LAB — VITAMIN D 25 HYDROXY (VIT D DEFICIENCY, FRACTURES): VITD: 42.55 ng/mL (ref 30.00–100.00)

## 2022-01-23 LAB — LIPID PANEL
Cholesterol: 129 mg/dL (ref 0–200)
HDL: 45.2 mg/dL (ref >39.00)
LDL Cholesterol: 59 mg/dL (ref 0–99)
NonHDL: 83.44
Total CHOL/HDL Ratio: 3
Triglycerides: 121 mg/dL (ref 0.0–149.0)
VLDL: 24.2 mg/dL (ref 0.0–40.0)

## 2022-01-23 LAB — HEPATIC FUNCTION PANEL
ALT: 18 U/L (ref 0–53)
AST: 14 U/L (ref 0–37)
Albumin: 4.4 g/dL (ref 3.5–5.2)
Alkaline Phosphatase: 79 U/L (ref 39–117)
Bilirubin, Direct: 0.2 mg/dL (ref 0.0–0.3)
Total Bilirubin: 0.7 mg/dL (ref 0.2–1.2)
Total Protein: 7.3 g/dL (ref 6.0–8.3)

## 2022-01-23 LAB — HEMOGLOBIN A1C: Hgb A1c MFr Bld: 6.9 % — ABNORMAL HIGH (ref 4.6–6.5)

## 2022-01-23 LAB — VITAMIN B12: Vitamin B-12: 310 pg/mL (ref 211–911)

## 2022-01-25 DIAGNOSIS — M9901 Segmental and somatic dysfunction of cervical region: Secondary | ICD-10-CM | POA: Diagnosis not present

## 2022-01-25 DIAGNOSIS — M9902 Segmental and somatic dysfunction of thoracic region: Secondary | ICD-10-CM | POA: Diagnosis not present

## 2022-01-25 DIAGNOSIS — M9903 Segmental and somatic dysfunction of lumbar region: Secondary | ICD-10-CM | POA: Diagnosis not present

## 2022-01-25 DIAGNOSIS — M25551 Pain in right hip: Secondary | ICD-10-CM | POA: Diagnosis not present

## 2022-01-30 DIAGNOSIS — M9903 Segmental and somatic dysfunction of lumbar region: Secondary | ICD-10-CM | POA: Diagnosis not present

## 2022-01-30 DIAGNOSIS — M9901 Segmental and somatic dysfunction of cervical region: Secondary | ICD-10-CM | POA: Diagnosis not present

## 2022-01-30 DIAGNOSIS — M25551 Pain in right hip: Secondary | ICD-10-CM | POA: Diagnosis not present

## 2022-01-30 DIAGNOSIS — M9902 Segmental and somatic dysfunction of thoracic region: Secondary | ICD-10-CM | POA: Diagnosis not present

## 2022-01-31 ENCOUNTER — Encounter: Payer: Self-pay | Admitting: Internal Medicine

## 2022-01-31 ENCOUNTER — Ambulatory Visit (INDEPENDENT_AMBULATORY_CARE_PROVIDER_SITE_OTHER): Payer: PPO | Admitting: Internal Medicine

## 2022-01-31 VITALS — BP 122/70 | HR 83 | Temp 98.3°F | Ht 73.0 in | Wt 214.0 lb

## 2022-01-31 DIAGNOSIS — M25551 Pain in right hip: Secondary | ICD-10-CM

## 2022-01-31 DIAGNOSIS — Z125 Encounter for screening for malignant neoplasm of prostate: Secondary | ICD-10-CM | POA: Diagnosis not present

## 2022-01-31 DIAGNOSIS — R1013 Epigastric pain: Secondary | ICD-10-CM | POA: Diagnosis not present

## 2022-01-31 DIAGNOSIS — E538 Deficiency of other specified B group vitamins: Secondary | ICD-10-CM | POA: Diagnosis not present

## 2022-01-31 DIAGNOSIS — E7800 Pure hypercholesterolemia, unspecified: Secondary | ICD-10-CM

## 2022-01-31 DIAGNOSIS — E559 Vitamin D deficiency, unspecified: Secondary | ICD-10-CM

## 2022-01-31 DIAGNOSIS — E1340 Other specified diabetes mellitus with diabetic neuropathy, unspecified: Secondary | ICD-10-CM | POA: Diagnosis not present

## 2022-01-31 NOTE — Assessment & Plan Note (Signed)
Last vitamin D Lab Results  Component Value Date   VD25OH 42.55 01/23/2022   Stable, cont oral replacement

## 2022-01-31 NOTE — Progress Notes (Signed)
Patient ID: Paul Tucker, male   DOB: [birth year], 71 y.o.   MRN: [id number]        Chief Complaint: follow up HTN, HLD and hyperglycemia, low vit B12 and D, right hip pain       HPI:  Paul Tucker is a 71 y.o. male here with c/o persistent right hip pain, mod to severe, worse to walk, near fall several times recent, has seen sport med but no improving.  Pt denies chest pain, increased sob or doe, wheezing, orthopnea, PND, increased LE swelling, palpitations, dizziness or syncope.   Pt denies polydipsia, polyuria, or new focal neuro s/s, but does have mild to mod worsening epigastric dull pain without radiation, has some mild nausea no vomiting or wt loss, in fact conts to gain wt.  Unfortunately gained wt with less active.  Wt Readings from Last 3 Encounters:  01/31/22 214 lb (97.1 kg)  09/04/21 213 lb (96.6 kg)  07/28/21 211 lb (95.7 kg)   BP Readings from Last 3 Encounters:  01/31/22 122/70  09/04/21 128/72  07/28/21 118/70         Past Medical History:  Diagnosis Date   Acute prostatitis 04/04/2007   ADD 01/17/2007   Anxiety    DEPRESSION 01/17/2007   Treatment Resident Depression   Dermatophytosis of groin and perianal area 05/31/2008   DIVERTICULOSIS, COLON 04/06/2007   Dysuria 01/27/2009   FLANK PAIN, LEFT 09/30/2007   Flushing 08/26/2009   GLUCOSE INTOLERANCE 04/04/2007   Gross hematuria 01/27/2009   HEMATOCHEZIA 07/26/2008   HYPERLIPIDEMIA 01/20/2007   HYPOGONADISM 06/13/2009   Impaired glucose tolerance 12/10/2010   INSOMNIA 04/04/2007   LIBIDO, DECREASED 08/27/8117   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   LOW BACK PAIN 01/20/2007   OBSTRUCTIVE SLEEP APNEA 04/04/2007   OTITIS MEDIA, ACUTE, BILATERAL 06/13/2009   PANCREATITIS, HX OF 01/17/2007   PSA, INCREASED 11/18/2009   RASH-NONVESICULAR 08/26/2009   SINUSITIS- ACUTE-NOS 04/04/2007   SWEATING 09/30/2007   THRUSH 04/04/2007   TONSILLECTOMY AND ADENOIDECTOMY, HX OF 01/17/2007   URI 07/25/2010   URINARY RETENTION 01/27/2009   VERTIGO  06/13/2009   Past Surgical History:  Procedure Laterality Date   CHOLECYSTECTOMY  2005   HEMORRHOID BANDING  2015   HERNIA REPAIR  1957   INGUINAL HERNIA REPAIR Right 07/27/2020   Procedure: RIGHT INGUINAL HERNIA REPAIR WITH MESH;  Surgeon: Erroll Luna, MD;  Location: [facility];  Service: General;  Laterality: Right;   [place]    reports that he has never smoked. He has never used smokeless tobacco. He reports that he does not drink alcohol and does not use drugs. family history includes Coronary artery disease in an other family member; Diabetes in an other family member; Emphysema in his father; Heart disease in his father. Allergies  Allergen Reactions   Atrovent [Ipratropium] Swelling    Throat swelling   Pravastatin Sodium Nausea Only    Pt does not recall this reaction   Current Outpatient Medications on File Prior to Visit  Medication Sig Dispense Refill   amantadine (SYMMETREL) 100 MG capsule Take 100 mg by mouth 2 (two) times daily.     aspirin 81 MG EC tablet Take 81 mg by mouth daily.     Cholecalciferol (VITAMIN D3 PO) Take 4,000 Units by mouth.     Ciclopirox 0.77 % gel USE AS DIRECTED TOPCIALLY 45 g 1   diclofenac Sodium (VOLTAREN) 1 % GEL Apply 4 g topically in the  morning and at bedtime.     diphenhydrAMINE (BENADRYL) 25 MG tablet Take 25 mg by mouth every 6 (six) hours as needed for allergies.     fluocinonide cream (LIDEX) 6.78 % Apply 1 Application topically 2 (two) times daily. 30 g 1   LORazepam (ATIVAN) 0.5 MG tablet Take 0.5 mg by mouth 2 (two) times daily as needed.     lovastatin (MEVACOR) 40 MG tablet TAKE ONE TABLET BY MOUTH EVERY NIGHT AT BEDTIME 90 tablet 3   meclizine (ANTIVERT) 25 MG tablet TAKE ONE TABLET BY MOUTH THREE TIMES A DAY AS NEEDED FOR DIZZINESS 30 tablet 2   meloxicam (MOBIC) 15 MG tablet Take 1 tablet (15 mg total) by mouth daily as needed for pain. 90 tablet 3   mometasone (ELOCON) 0.1 % cream APPLY TO AFFECTED  AREA(S) DAILY 45 g 0   OLANZapine (ZYPREXA) 2.5 MG tablet Take 2.5 mg by mouth at bedtime.     tamsulosin (FLOMAX) 0.4 MG CAPS capsule TAKE ONE CAPSULE BY MOUTH DAILY 90 capsule 3   TRINTELLIX 20 MG TABS tablet Take 20 mg by mouth daily.     zolpidem (AMBIEN) 10 MG tablet TAKE ONE TABLET BY MOUTH EVERY NIGHT AT BEDTIME 90 tablet 1   No current facility-administered medications on file prior to visit.        ROS:  All others reviewed and negative.  Objective        PE:  BP 122/70 (BP Location: Left Arm, Patient Position: Sitting, Cuff Size: Large)   Pulse 83   Temp 98.3 F (36.8 C) (Oral)   Ht '6\' 1"'$  (1.854 m)   Wt 214 lb (97.1 kg)   SpO2 91%   BMI 28.23 kg/m                 Constitutional: Pt appears in NAD               HENT: Head: NCAT.                Right Ear: External ear normal.                 Left Ear: External ear normal.                Eyes: . Pupils are equal, round, and reactive to light. Conjunctivae and EOM are normal               Nose: without d/c or deformity               Neck: Neck supple. Gross normal ROM               Cardiovascular: Normal rate and regular rhythm.                 Pulmonary/Chest: Effort normal and breath sounds without rales or wheezing.                Abd:  Soft, mild epigastric tender, ND, + BS, no organomegaly               Neurological: Pt is alert. At baseline orientation, motor grossly intact               Skin: Skin is warm. No rashes, no other new lesions, LE edema - none, right lateral hip nontender, but pain with forward flexion               Psychiatric: Pt behavior is normal without agitation  Micro: none  Cardiac tracings I have personally interpreted today:  none  Pertinent Radiological findings (summarize): none   Lab Results  Component Value Date   WBC 5.3 07/18/2021   HGB 13.7 07/18/2021   HCT 38.8 (L) 07/18/2021   PLT 176.0 07/18/2021   GLUCOSE 188 (H) 01/23/2022   CHOL 129 01/23/2022   TRIG 121.0 01/23/2022    HDL 45.20 01/23/2022   LDLDIRECT 82.0 02/28/2017   LDLCALC 59 01/23/2022   ALT 18 01/23/2022   AST 14 01/23/2022   NA 136 01/23/2022   K 3.9 01/23/2022   CL 104 01/23/2022   CREATININE 0.99 01/23/2022   BUN 19 01/23/2022   CO2 22 01/23/2022   TSH 1.33 07/18/2021   PSA 4.30 (H) 07/18/2021   HGBA1C 6.9 (H) 01/23/2022   MICROALBUR 1.2 07/18/2021   Assessment/Plan:  Paul Tucker is a 71 y.o. White or Caucasian [1] male with  has a past medical history of Acute prostatitis (04/04/2007), ADD (01/17/2007), Anxiety, DEPRESSION (01/17/2007), Dermatophytosis of groin and perianal area (05/31/2008), DIVERTICULOSIS, COLON (04/06/2007), Dysuria (01/27/2009), FLANK PAIN, LEFT (09/30/2007), Flushing (08/26/2009), GLUCOSE INTOLERANCE (04/04/2007), Gross hematuria (01/27/2009), HEMATOCHEZIA (07/26/2008), HYPERLIPIDEMIA (01/20/2007), HYPOGONADISM (06/13/2009), Impaired glucose tolerance (12/10/2010), INSOMNIA (04/04/2007), LIBIDO, DECREASED (2/45/8099), LICHEN SIMPLEX CHRONICUS (05/31/2008), LOW BACK PAIN (01/20/2007), OBSTRUCTIVE SLEEP APNEA (04/04/2007), OTITIS MEDIA, ACUTE, BILATERAL (06/13/2009), PANCREATITIS, HX OF (01/17/2007), PSA, INCREASED (11/18/2009), RASH-NONVESICULAR (08/26/2009), SINUSITIS- ACUTE-NOS (04/04/2007), SWEATING (09/30/2007), THRUSH (04/04/2007), TONSILLECTOMY AND ADENOIDECTOMY, HX OF (01/17/2007), URI (07/25/2010), URINARY RETENTION (01/27/2009), and VERTIGO (06/13/2009).  Vitamin D deficiency Last vitamin D Lab Results  Component Value Date   VD25OH 42.55 01/23/2022   Stable, cont oral replacement   B12 deficiency Lab Results  Component Value Date   VITAMINB12 310 01/23/2022   Stable, cont oral replacement - b12 1000 mcg qd   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HCC) Lab Results  Component Value Date   HGBA1C 6.9 (H) 01/23/2022   Stable, pt to continue current medical treatment  - diet, wt control, exercise; declines rybelsus for now for wt control and sugar   Hyperlipidemia Lab Results   Component Value Date   LDLCALC 59 01/23/2022   Stable, pt to continue current statin lovastatin 40 mg qd   Right hip pain ? Soft tissue vs hip arthritis - for Right hip MRI  Epigastric pain With mild tender, decliens ppi or GI referral, for check h pylori ab  Followup: Return in about 6 months (around 08/01/2022).  Cathlean Cower, MD 02/03/2022 10:09 PM [facility]

## 2022-01-31 NOTE — Assessment & Plan Note (Signed)
Lab Results  Component Value Date   VITAMINB12 310 01/23/2022   Stable, cont oral replacement - b12 1000 mcg qd

## 2022-01-31 NOTE — Patient Instructions (Signed)
You will be contacted regarding the referral for: Right hip MRI  Please call if you change your mind about the rybelsus, and /or protonix  Please continue all other medications as before, and refills have been done if requested.  Please have the pharmacy call with any other refills you may need.  Please continue your efforts at being more active, low cholesterol diet, and weight control.  You are otherwise up to date with prevention measures today.  Please keep your appointments with your specialists as you may have planned  Please make an Appointment to return in 6 months, or sooner if needed, also with Lab Appointment for testing done 3-5 days before at the [facility] (so this is for TWO appointments - please see the scheduling desk as you leave)

## 2022-02-01 DIAGNOSIS — M9903 Segmental and somatic dysfunction of lumbar region: Secondary | ICD-10-CM | POA: Diagnosis not present

## 2022-02-01 DIAGNOSIS — M25551 Pain in right hip: Secondary | ICD-10-CM | POA: Diagnosis not present

## 2022-02-01 DIAGNOSIS — M9902 Segmental and somatic dysfunction of thoracic region: Secondary | ICD-10-CM | POA: Diagnosis not present

## 2022-02-01 DIAGNOSIS — M9901 Segmental and somatic dysfunction of cervical region: Secondary | ICD-10-CM | POA: Diagnosis not present

## 2022-02-03 ENCOUNTER — Encounter: Payer: Self-pay | Admitting: Internal Medicine

## 2022-02-03 DIAGNOSIS — R1013 Epigastric pain: Secondary | ICD-10-CM | POA: Insufficient documentation

## 2022-02-03 NOTE — Assessment & Plan Note (Signed)
Lab Results  Component Value Date   LDLCALC 59 01/23/2022   Stable, pt to continue current statin lovastatin 40 mg qd

## 2022-02-03 NOTE — Assessment & Plan Note (Signed)
With mild tender, decliens ppi or GI referral, for check h pylori ab

## 2022-02-03 NOTE — Assessment & Plan Note (Signed)
?   Soft tissue vs hip arthritis - for Right hip MRI

## 2022-02-03 NOTE — Assessment & Plan Note (Addendum)
Lab Results  Component Value Date   HGBA1C 6.9 (H) 01/23/2022   Stable, pt to continue current medical treatment  - diet, wt control, exercise; declines rybelsus for now for wt control and sugar

## 2022-02-09 DIAGNOSIS — M9902 Segmental and somatic dysfunction of thoracic region: Secondary | ICD-10-CM | POA: Diagnosis not present

## 2022-02-09 DIAGNOSIS — M9901 Segmental and somatic dysfunction of cervical region: Secondary | ICD-10-CM | POA: Diagnosis not present

## 2022-02-09 DIAGNOSIS — M25551 Pain in right hip: Secondary | ICD-10-CM | POA: Diagnosis not present

## 2022-02-09 DIAGNOSIS — M9903 Segmental and somatic dysfunction of lumbar region: Secondary | ICD-10-CM | POA: Diagnosis not present

## 2022-02-15 DIAGNOSIS — M9901 Segmental and somatic dysfunction of cervical region: Secondary | ICD-10-CM | POA: Diagnosis not present

## 2022-02-15 DIAGNOSIS — M9903 Segmental and somatic dysfunction of lumbar region: Secondary | ICD-10-CM | POA: Diagnosis not present

## 2022-02-15 DIAGNOSIS — M9902 Segmental and somatic dysfunction of thoracic region: Secondary | ICD-10-CM | POA: Diagnosis not present

## 2022-02-15 DIAGNOSIS — M25551 Pain in right hip: Secondary | ICD-10-CM | POA: Diagnosis not present

## 2022-02-17 ENCOUNTER — Ambulatory Visit
Admission: RE | Admit: 2022-02-17 | Discharge: 2022-02-17 | Disposition: A | Discharge status: Home / Self Care | Payer: PPO | Source: Ambulatory Visit | Attending: Internal Medicine | Admitting: Internal Medicine

## 2022-02-17 DIAGNOSIS — M25551 Pain in right hip: Secondary | ICD-10-CM

## 2022-02-20 DIAGNOSIS — M25551 Pain in right hip: Secondary | ICD-10-CM | POA: Diagnosis not present

## 2022-02-20 DIAGNOSIS — M9902 Segmental and somatic dysfunction of thoracic region: Secondary | ICD-10-CM | POA: Diagnosis not present

## 2022-02-20 DIAGNOSIS — M9901 Segmental and somatic dysfunction of cervical region: Secondary | ICD-10-CM | POA: Diagnosis not present

## 2022-02-20 DIAGNOSIS — M9903 Segmental and somatic dysfunction of lumbar region: Secondary | ICD-10-CM | POA: Diagnosis not present

## 2022-02-21 ENCOUNTER — Other Ambulatory Visit: Payer: Self-pay | Admitting: Internal Medicine

## 2022-02-21 DIAGNOSIS — S73191A Other sprain of right hip, initial encounter: Secondary | ICD-10-CM

## 2022-02-22 ENCOUNTER — Encounter: Payer: Self-pay | Admitting: Internal Medicine

## 2022-02-22 DIAGNOSIS — M9903 Segmental and somatic dysfunction of lumbar region: Secondary | ICD-10-CM | POA: Diagnosis not present

## 2022-02-22 DIAGNOSIS — M25551 Pain in right hip: Secondary | ICD-10-CM | POA: Diagnosis not present

## 2022-02-22 DIAGNOSIS — M9901 Segmental and somatic dysfunction of cervical region: Secondary | ICD-10-CM | POA: Diagnosis not present

## 2022-02-22 DIAGNOSIS — M9902 Segmental and somatic dysfunction of thoracic region: Secondary | ICD-10-CM | POA: Diagnosis not present

## 2022-02-23 MED ORDER — CYCLOBENZAPRINE HCL 5 MG PO TABS: 5.0000 mg | ORAL_TABLET | Freq: Three times a day (TID) | ORAL | 2 refills | Status: DC | PRN

## 2022-02-26 ENCOUNTER — Ambulatory Visit (INDEPENDENT_AMBULATORY_CARE_PROVIDER_SITE_OTHER): Payer: PPO | Admitting: *Deleted

## 2022-02-26 DIAGNOSIS — Z23 Encounter for immunization: Secondary | ICD-10-CM | POA: Diagnosis not present

## 2022-02-26 NOTE — Progress Notes (Signed)
Patient here for his high dose flu vaccines. Given in right deltoid. Patient tolerated well.   Please co sign

## 2022-03-01 DIAGNOSIS — M9901 Segmental and somatic dysfunction of cervical region: Secondary | ICD-10-CM | POA: Diagnosis not present

## 2022-03-01 DIAGNOSIS — M9902 Segmental and somatic dysfunction of thoracic region: Secondary | ICD-10-CM | POA: Diagnosis not present

## 2022-03-01 DIAGNOSIS — M9903 Segmental and somatic dysfunction of lumbar region: Secondary | ICD-10-CM | POA: Diagnosis not present

## 2022-03-01 DIAGNOSIS — M25551 Pain in right hip: Secondary | ICD-10-CM | POA: Diagnosis not present

## 2022-03-09 DIAGNOSIS — M25551 Pain in right hip: Secondary | ICD-10-CM | POA: Diagnosis not present

## 2022-04-20 ENCOUNTER — Other Ambulatory Visit: Payer: Self-pay | Admitting: Internal Medicine

## 2022-04-30 ENCOUNTER — Ambulatory Visit (INDEPENDENT_AMBULATORY_CARE_PROVIDER_SITE_OTHER): Payer: PPO

## 2022-04-30 VITALS — BP 116/60 | HR 81 | Temp 97.7°F | Resp 16 | Ht 73.0 in | Wt 217.2 lb

## 2022-04-30 DIAGNOSIS — Z0000 Encounter for general adult medical examination without abnormal findings: Secondary | ICD-10-CM | POA: Diagnosis not present

## 2022-04-30 NOTE — Patient Instructions (Addendum)
Paul Tucker , Thank you for taking time to come for your Medicare Wellness Visit. I appreciate your ongoing commitment to your health goals. Please review the following plan we discussed and let me know if I can assist you in the future.   These are the goals we discussed:  Goals      Maintain my health by staying independent and socially active.        This is a list of the screening recommended for you and due dates:  Health Maintenance  Topic Date Due   DTaP/Tdap/Td vaccine (2 - Tdap) 12/03/2018   COVID-19 Vaccine (5 - 2023-24 season) 01/19/2022   Complete foot exam   01/25/2022   Yearly kidney health urinalysis for diabetes  07/18/2022   Hemoglobin A1C  07/24/2022   Eye exam for diabetics  12/15/2022   Yearly kidney function blood test for diabetes  01/24/2023   Medicare Annual Wellness Visit  05/01/2023   Colon Cancer Screening  07/05/2024   Pneumonia Vaccine  Completed   Flu Shot  Completed   Hepatitis C Screening: USPSTF Recommendation to screen - Ages 18-79 yo.  Completed   Zoster (Shingles) Vaccine  Completed   HPV Vaccine  Aged Out    Advanced directives: Yes  Conditions/risks identified: Yes  Next appointment: Follow up in one year for your annual wellness visit.   Preventive Care 29 Years and Older, Male  Preventive care refers to lifestyle choices and visits with your health care provider that can promote health and wellness. What does preventive care include? A yearly physical exam. This is also called an annual well check. Dental exams once or twice a year. Routine eye exams. Ask your health care provider how often you should have your eyes checked. Personal lifestyle choices, including: Daily care of your teeth and gums. Regular physical activity. Eating a healthy diet. Avoiding tobacco and drug use. Limiting alcohol use. Practicing safe sex. Taking low doses of aspirin every day. Taking vitamin and mineral supplements as recommended by your health care  provider. What happens during an annual well check? The services and screenings done by your health care provider during your annual well check will depend on your age, overall health, lifestyle risk factors, and family history of disease. Counseling  Your health care provider may ask you questions about your: Alcohol use. Tobacco use. Drug use. Emotional well-being. Home and relationship well-being. Sexual activity. Eating habits. History of falls. Memory and ability to understand (cognition). Work and work Statistician. Screening  You may have the following tests or measurements: Height, weight, and BMI. Blood pressure. Lipid and cholesterol levels. These may be checked every 5 years, or more frequently if you are over 68 years old. Skin check. Lung cancer screening. You may have this screening every year starting at age 71 if you have a 30-pack-year history of smoking and currently smoke or have quit within the past 15 years. Fecal occult blood test (FOBT) of the stool. You may have this test every year starting at age 34. Flexible sigmoidoscopy or colonoscopy. You may have a sigmoidoscopy every 5 years or a colonoscopy every 10 years starting at age 35. Prostate cancer screening. Recommendations will vary depending on your family history and other risks. Hepatitis C blood test. Hepatitis B blood test. Sexually transmitted disease (STD) testing. Diabetes screening. This is done by checking your blood sugar (glucose) after you have not eaten for a while (fasting). You may have this done every 1-3 years. Abdominal aortic aneurysm (  AAA) screening. You may need this if you are a current or former smoker. Osteoporosis. You may be screened starting at age 39 if you are at high risk. Talk with your health care provider about your test results, treatment options, and if necessary, the need for more tests. Vaccines  Your health care provider may recommend certain vaccines, such  as: Influenza vaccine. This is recommended every year. Tetanus, diphtheria, and acellular pertussis (Tdap, Td) vaccine. You may need a Td booster every 10 years. Zoster vaccine. You may need this after age 5. Pneumococcal 13-valent conjugate (PCV13) vaccine. One dose is recommended after age 4. Pneumococcal polysaccharide (PPSV23) vaccine. One dose is recommended after age 19. Talk to your health care provider about which screenings and vaccines you need and how often you need them. This information is not intended to replace advice given to you by your health care provider. Make sure you discuss any questions you have with your health care provider. Document Released: 06/03/2015 Document Revised: 01/25/2016 Document Reviewed: 03/08/2015 Elsevier Interactive Patient Education  2017 [place] Prevention in the Home Falls can cause injuries. They can happen to people of all ages. There are many things you can do to make your home safe and to help prevent falls. What can I do on the outside of my home? Regularly fix the edges of walkways and driveways and fix any cracks. Remove anything that might make you trip as you walk through a door, such as a raised step or threshold. Trim any bushes or trees on the path to your home. Use bright outdoor lighting. Clear any walking paths of anything that might make someone trip, such as rocks or tools. Regularly check to see if handrails are loose or broken. Make sure that both sides of any steps have handrails. Any raised decks and porches should have guardrails on the edges. Have any leaves, snow, or ice cleared regularly. Use sand or salt on walking paths during winter. Clean up any spills in your garage right away. This includes oil or grease spills. What can I do in the bathroom? Use night lights. Install grab bars by the toilet and in the tub and shower. Do not use towel bars as grab bars. Use non-skid mats or decals in the tub or  shower. If you need to sit down in the shower, use a plastic, non-slip stool. Keep the floor dry. Clean up any water that spills on the floor as soon as it happens. Remove soap buildup in the tub or shower regularly. Attach bath mats securely with double-sided non-slip rug tape. Do not have throw rugs and other things on the floor that can make you trip. What can I do in the bedroom? Use night lights. Make sure that you have a light by your bed that is easy to reach. Do not use any sheets or blankets that are too big for your bed. They should not hang down onto the floor. Have a firm chair that has side arms. You can use this for support while you get dressed. Do not have throw rugs and other things on the floor that can make you trip. What can I do in the kitchen? Clean up any spills right away. Avoid walking on wet floors. Keep items that you use a lot in easy-to-reach places. If you need to reach something above you, use a strong step stool that has a grab bar. Keep electrical cords out of the way. Do not use floor polish  or wax that makes floors slippery. If you must use wax, use non-skid floor wax. Do not have throw rugs and other things on the floor that can make you trip. What can I do with my stairs? Do not leave any items on the stairs. Make sure that there are handrails on both sides of the stairs and use them. Fix handrails that are broken or loose. Make sure that handrails are as [place] as the stairways. Check any carpeting to make sure that it is firmly attached to the stairs. Fix any carpet that is loose or worn. Avoid having throw rugs at the top or bottom of the stairs. If you do have throw rugs, attach them to the floor with carpet tape. Make sure that you have a light switch at the top of the stairs and the bottom of the stairs. If you do not have them, ask someone to add them for you. What else can I do to help prevent falls? Wear shoes that: Do not have high heels. Have  rubber bottoms. Are comfortable and fit you well. Are closed at the toe. Do not wear sandals. If you use a stepladder: Make sure that it is fully opened. Do not climb a closed stepladder. Make sure that both sides of the stepladder are locked into place. Ask someone to hold it for you, if possible. Clearly mark and make sure that you can see: Any grab bars or handrails. First and last steps. Where the edge of each step is. Use tools that help you move around (mobility aids) if they are needed. These include: Canes. Walkers. Scooters. Crutches. Turn on the lights when you go into a dark area. Replace any light bulbs as soon as they burn out. Set up your furniture so you have a clear path. Avoid moving your furniture around. If any of your floors are uneven, fix them. If there are any pets around you, be aware of where they are. Review your medicines with your doctor. Some medicines can make you feel dizzy. This can increase your chance of falling. Ask your doctor what other things that you can do to help prevent falls. This information is not intended to replace advice given to you by your health care provider. Make sure you discuss any questions you have with your health care provider. Document Released: 03/03/2009 Document Revised: 10/13/2015 Document Reviewed: 06/11/2014 Elsevier Interactive Patient Education  2017 [employer].

## 2022-04-30 NOTE — Progress Notes (Addendum)
Subjective:   Paul Tucker is a 71 y.o. male who presents for Medicare Annual/Subsequent preventive examination.  Review of Systems     Cardiac Risk Factors include: advanced age (>9mn, >>24women);male gender;family history of premature cardiovascular disease;dyslipidemia;sedentary lifestyle     Objective:    Today's Vitals   04/30/22 1006  BP: 116/60  Pulse: 81  Resp: 16  Temp: 97.7 F (36.5 C)  TempSrc: Temporal  SpO2: 94%  Weight: 217 lb 3.2 oz (98.5 kg)  Height: _0  (1.854 m)  PainSc: 0-No pain   Body mass index is 28.66 kg/m.     04/30/2022   10:47 AM 05/24/2021   11:06 AM 04/28/2021   11:29 AM 07/27/2020    6:36 AM 03/14/2020    2:21 PM  Advanced Directives  Does Patient Have a Medical Advance Directive? Yes Yes Yes No Yes  Type of [place]of [place] Living will;Healthcare Power of [place]  Does patient want to make changes to medical advance directive?   No - Patient declined  No - Patient declined  Copy of [place] Chart? No - copy requested  No - copy requested  No - copy requested  Would patient like information on creating a medical advance directive?    No - Patient declined     Current Medications (verified) Outpatient Encounter Medications as of 04/30/2022  Medication Sig   amantadine (SYMMETREL) 100 MG capsule Take 100 mg by mouth 2 (two) times daily.   aspirin 81 MG EC tablet Take 81 mg by mouth daily.   Cholecalciferol (VITAMIN D3 PO) Take 4,000 Units by mouth.   Ciclopirox 0.77 % gel USE AS DIRECTED TOPCIALLY   cyclobenzaprine (FLEXERIL) 5 MG tablet Take 1 tablet (5 mg total) by mouth 3 (three) times daily as needed for muscle spasms.   diclofenac Sodium (VOLTAREN) 1 % GEL Apply 4 g topically in the morning and at bedtime.   diphenhydrAMINE (BENADRYL) 25 MG tablet Take 25 mg by mouth every 6 (six)  hours as needed for allergies.   fluocinonide cream (LIDEX) 03.78% Apply 1 Application topically 2 (two) times daily.   LORazepam (ATIVAN) 0.5 MG tablet Take 0.5 mg by mouth 2 (two) times daily as needed.   lovastatin (MEVACOR) 40 MG tablet TAKE ONE TABLET BY MOUTH EVERY NIGHT AT BEDTIME   meclizine (ANTIVERT) 25 MG tablet TAKE ONE TABLET BY MOUTH THREE TIMES A DAY AS NEEDED FOR DIZZINESS   meloxicam (MOBIC) 15 MG tablet Take 1 tablet (15 mg total) by mouth daily as needed for pain.   mometasone (ELOCON) 0.1 % cream APPLY TO AFFECTED AREA(S) DAILY   OLANZapine (ZYPREXA) 2.5 MG tablet Take 2.5 mg by mouth at bedtime.   tamsulosin (FLOMAX) 0.4 MG CAPS capsule TAKE ONE CAPSULE BY MOUTH DAILY   TRINTELLIX 20 MG TABS tablet Take 20 mg by mouth daily.   zolpidem (AMBIEN) 10 MG tablet TAKE ONE TABLET BY MOUTH EVERY NIGHT AT BEDTIME   No facility-administered encounter medications on file as of 04/30/2022.    Allergies (verified) Atrovent [ipratropium] and Pravastatin sodium   History: Past Medical History:  Diagnosis Date   Acute prostatitis 04/04/2007   ADD 01/17/2007   Anxiety    DEPRESSION 01/17/2007   Treatment Resident Depression   Dermatophytosis of groin and perianal area 05/31/2008   DIVERTICULOSIS, COLON 04/06/2007   Dysuria 01/27/2009   FLANK  PAIN, LEFT 09/30/2007   Flushing 08/26/2009   GLUCOSE INTOLERANCE 04/04/2007   Gross hematuria 01/27/2009   HEMATOCHEZIA 07/26/2008   HYPERLIPIDEMIA 01/20/2007   HYPOGONADISM 06/13/2009   Impaired glucose tolerance 12/10/2010   INSOMNIA 04/04/2007   LIBIDO, DECREASED 0/96/0454   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   LOW BACK PAIN 01/20/2007   OBSTRUCTIVE SLEEP APNEA 04/04/2007   OTITIS MEDIA, ACUTE, BILATERAL 06/13/2009   PANCREATITIS, HX OF 01/17/2007   PSA, INCREASED 11/18/2009   RASH-NONVESICULAR 08/26/2009   SINUSITIS- ACUTE-NOS 04/04/2007   SWEATING 09/30/2007   THRUSH 04/04/2007   TONSILLECTOMY AND ADENOIDECTOMY, HX OF 01/17/2007   URI 07/25/2010    URINARY RETENTION 01/27/2009   VERTIGO 06/13/2009   Past Surgical History:  Procedure Laterality Date   CHOLECYSTECTOMY  2005   HEMORRHOID BANDING  2015   HERNIA REPAIR  1957   INGUINAL HERNIA REPAIR Right 07/27/2020   Procedure: RIGHT INGUINAL HERNIA REPAIR WITH MESH;  Surgeon: Erroll Luna, MD;  Location: [facility];  Service: General;  Laterality: Right;   TONSILLECTOMY     VASECTOMY  1987   Family History  Problem Relation Age of Onset   Emphysema Father    Heart disease Father    Coronary artery disease Other        male, 1st degree relative   Diabetes Other        1st degree relative   Colon cancer Neg Hx    Esophageal cancer Neg Hx    Rectal cancer Neg Hx    Stomach cancer Neg Hx    Social History   Socioeconomic History   Marital status: Married    Spouse name: Not on file   Number of children: 3   Years of education: Not on file   Highest education level: Not on file  Occupational History   Occupation: Brewing technologist  Tobacco Use   Smoking status: Never   Smokeless tobacco: Never  Vaping Use   Vaping Use: Never used  Substance and Sexual Activity   Alcohol use: Never    Alcohol/week: 0.0 standard drinks of alcohol   Drug use: No   Sexual activity: Not on file  Other Topics Concern   Not on file  Social History Narrative   Not on file   Social Determinants of Health   Financial Resource Strain: Low Risk  (04/30/2022)   Overall Financial Resource Strain (CARDIA)    Difficulty of Paying Living Expenses: Not hard at all  Food Insecurity: No Food Insecurity (04/30/2022)   Hunger Vital Sign    Worried About Running Out of Food in the Last Year: Never true    Ran Out of Food in the Last Year: Never true  Transportation Needs: No Transportation Needs (04/30/2022)   PRAPARE - Hydrologist (Medical): No    Lack of Transportation (Non-Medical): No  Physical Activity: Inactive (04/30/2022)   Exercise Vital Sign    Days of  Exercise per Week: 0 days    Minutes of Exercise per Session: 0 min  Stress: No Stress Concern Present (04/30/2022)   [place]    Feeling of Stress : Not at all  Social Connections: Moderately Isolated (04/30/2022)   Social Connection and Isolation Panel [NHANES]    Frequency of Communication with Friends and Family: Once a week    Frequency of Social Gatherings with Friends and Family: Once a week    Attends Religious Services: More than 4  times per year    Active Member of Clubs or Organizations: No    Attends Archivist Meetings: Never    Marital Status: Married    Tobacco Counseling Counseling given: Not Answered   Clinical Intake:  Pre-visit preparation completed: Yes  Pain : No/denies pain Pain Score: 0-No pain     BMI - recorded: 28.66 Nutritional Status: BMI 25 -29 Overweight Nutritional Risks: None Diabetes: Yes CBG done?: No Did pt. bring in CBG monitor from home?: No  How often do you need to have someone help you when you read instructions, pamphlets, or other written materials from your doctor or pharmacy?: 1 - Never What is the last grade level you completed in school?: HSG;some college  Diabetic? no  Interpreter Needed?: No  Information entered by :: [name], LPN.   Activities of Daily Living    04/30/2022   10:09 AM 04/26/2022    8:49 AM  In your present state of health, do you have any difficulty performing the following activities:  Hearing? 0 0  Vision? 0 0  Difficulty concentrating or making decisions? 0 0  Walking or climbing stairs? 0 0  Dressing or bathing? 0 0  Doing errands, shopping? 0 0  Preparing Food and eating ? N N  Using the Toilet? N N  In the past six months, have you accidently leaked urine? N N  Do you have problems with loss of bowel control? N N  Managing your Medications? N N  Managing your Finances? N N  Housekeeping or managing  your Housekeeping? N N    Patient Care Team: Biagio Borg, MD as PCP - General Donato Heinz, MD as PCP - Cardiology (Cardiology) Marica Otter, OD as Consulting Physician (Optometry)  Indicate any recent Medical Services you may have received from other than [facility] providers in the past year (date may be approximate).     Assessment:   This is a routine wellness examination for Ishaq.  Hearing/Vision screen Hearing Screening - Comments:: Denies hearing difficulties   Vision Screening - Comments:: Wears rx glasses - up to date with routine eye exams with Marica Otter, OD.   Dietary issues and exercise activities discussed: Current Exercise Habits: The patient does not participate in regular exercise at present   Goals Addressed             This Visit's Progress    Maintain my health by staying independent and socially active.        Depression Screen    04/30/2022   10:06 AM 01/31/2022    8:04 AM 07/28/2021    8:04 AM 04/28/2021   11:28 AM 01/25/2021    8:23 AM 01/25/2021    8:09 AM 03/14/2020    2:19 PM  PHQ 2/9 Scores  PHQ - 2 Score _0 0 0 2 2  PHQ- 9 Score _1 Fall Risk    04/26/2022    8:49 AM 01/31/2022    8:04 AM 07/28/2021    8:04 AM 04/28/2021   11:29 AM 01/25/2021    8:23 AM  Fall Risk   Falls in the past year?  0 0 0   Number falls in past yr: 0  0 0 0  Injury with Fall? 0 0 0 0 0  Risk for fall due to :  No Fall Risks  No Fall Risks   Follow up  Falls evaluation completed  Falls prevention  discussed     FALL RISK PREVENTION PERTAINING TO THE HOME:  Any stairs in or around the home? Yes  If so, are there any without handrails? No  Home free of loose throw rugs in walkways, pet beds, electrical cords, etc? Yes  Adequate lighting in your home to reduce risk of falls? Yes   ASSISTIVE DEVICES UTILIZED TO PREVENT FALLS:  Life alert? No  Use of a cane, walker or w/c? No  Grab bars in the bathroom? Yes  Shower chair or bench in  shower? Yes  Elevated toilet seat or a handicapped toilet? Yes   TIMED UP AND GO:  Was the test performed? Yes .  Length of time to ambulate 10 feet: 8 sec.   Gait steady and fast without use of assistive device  Cognitive Function:        04/30/2022   10:09 AM  6CIT Screen  What Year? 0 points  What month? 0 points  What time? 0 points  Count back from 20 0 points  Months in reverse 0 points  Repeat phrase 0 points  Total Score 0 points    Immunizations Immunization History  Administered Date(s) Administered   Fluad Quad(high Dose 65+) 02/28/2019, 03/09/2020, 03/03/2021, 02/26/2022   Influenza Whole 02/28/2006, 04/04/2007   Influenza, High Dose Seasonal PF 03/06/2017, 02/07/2018   Influenza,inj,Quad PF,6+ Mos 02/25/2014, 03/10/2015, 03/13/2016   Influenza-Unspecified 02/19/2012   PFIZER(Purple Top)SARS-COV-2 Vaccination 06/25/2019, 07/20/2019, 02/15/2020   Pfizer Covid-19 Vaccine Bivalent Booster 32yr & up 02/09/2021   Pneumococcal Conjugate-13 03/06/2017   Pneumococcal Polysaccharide-23 02/25/2014, 07/20/2020   Td 12/02/2008   Zoster Recombinat (Shingrix) 07/24/2018, 11/26/2018   Zoster, Live 12/04/2011    TDAP status: Due, Education has been provided regarding the importance of this vaccine. Advised may receive this vaccine at local pharmacy or Health Dept. Aware to provide a copy of the vaccination record if obtained from local pharmacy or Health Dept. Verbalized acceptance and understanding.  Flu Vaccine status: Up to date  Pneumococcal vaccine status: Up to date  Covid-19 vaccine status: Completed vaccines  Qualifies for Shingles Vaccine? Yes   Zostavax completed Yes   Shingrix Completed?: Yes  Screening Tests Health Maintenance  Topic Date Due   DTaP/Tdap/Td (2 - Tdap) 12/03/2018   COVID-19 Vaccine (5 - 2023-24 season) 01/19/2022   FOOT EXAM  01/25/2022   Diabetic kidney evaluation - Urine ACR  07/18/2022   HEMOGLOBIN A1C  07/24/2022    OPHTHALMOLOGY EXAM  12/15/2022   Diabetic kidney evaluation - eGFR measurement  01/24/2023   Medicare Annual Wellness (AWV)  05/01/2023   COLONOSCOPY (Pts 45-466yrInsurance coverage will need to be confirmed)  07/05/2024   Pneumonia Vaccine 6522Years old  Completed   INFLUENZA VACCINE  Completed   Hepatitis C Screening  Completed   Zoster Vaccines- Shingrix  Completed   HPV VACCINES  Aged Out    Health Maintenance  Health Maintenance Due  Topic Date Due   DTaP/Tdap/Td (2 - Tdap) 12/03/2018   COVID-19 Vaccine (5 - 2023-24 season) 01/19/2022   FOOT EXAM  01/25/2022    Colorectal cancer screening: Type of screening: Colonoscopy. Completed 07/05/2014. Repeat every 10 years  Lung Cancer Screening: (Low Dose CT Chest recommended if Age 70-80ears, 30 pack-year currently smoking OR have quit w/in 15years.) does not qualify.   Lung Cancer Screening Referral: no  Additional Screening:  Hepatitis C Screening: does qualify; Completed 09/08/2015  Vision Screening: Recommended annual ophthalmology exams for early detection of glaucoma and other  disorders of the eye. Is the patient up to date with their annual eye exam?  Yes  Who is the provider or what is the name of the office in which the patient attends annual eye exams? Marica Otter, OD. If pt is not established with a provider, would they like to be referred to a provider to establish care? No .   Dental Screening: Recommended annual dental exams for proper oral hygiene  Community Resource Referral / Chronic Care Management: CRR required this visit?  No   CCM required this visit?  No      Plan:     I have personally reviewed and noted the following in the patient's chart:   Medical and social history Use of alcohol, tobacco or illicit drugs  Current medications and supplements including opioid prescriptions. Patient is not currently taking opioid prescriptions. Functional ability and status Nutritional status Physical  activity Advanced directives List of other physicians Hospitalizations, surgeries, and ER visits in previous 12 months Vitals Screenings to include cognitive, depression, and falls Referrals and appointments  In addition, I have reviewed and discussed with patient certain preventive protocols, quality metrics, and best practice recommendations. A written personalized care plan for preventive services as well as general preventive health recommendations were provided to patient.     Sheral Flow, LPN   [id number]/46/5035   Nurse Notes: Normal cognitive status assessed by direct observation by this Nurse Health Advisor. No abnormalities found.   Medical screening examination/treatment/procedure(s) were performed by non-physician practitioner and as supervising physician I was immediately available for consultation/collaboration.  I agree with above. Lew Dawes, MD

## 2022-06-27 DIAGNOSIS — L300 Nummular dermatitis: Secondary | ICD-10-CM | POA: Diagnosis not present

## 2022-06-27 DIAGNOSIS — L821 Other seborrheic keratosis: Secondary | ICD-10-CM | POA: Diagnosis not present

## 2022-06-27 DIAGNOSIS — D225 Melanocytic nevi of trunk: Secondary | ICD-10-CM | POA: Diagnosis not present

## 2022-06-27 DIAGNOSIS — D2261 Melanocytic nevi of right upper limb, including shoulder: Secondary | ICD-10-CM | POA: Diagnosis not present

## 2022-06-27 DIAGNOSIS — L738 Other specified follicular disorders: Secondary | ICD-10-CM | POA: Diagnosis not present

## 2022-06-27 DIAGNOSIS — D2262 Melanocytic nevi of left upper limb, including shoulder: Secondary | ICD-10-CM | POA: Diagnosis not present

## 2022-06-27 DIAGNOSIS — L570 Actinic keratosis: Secondary | ICD-10-CM | POA: Diagnosis not present

## 2022-06-28 ENCOUNTER — Encounter: Payer: Self-pay | Admitting: Internal Medicine

## 2022-07-03 DIAGNOSIS — M9902 Segmental and somatic dysfunction of thoracic region: Secondary | ICD-10-CM | POA: Diagnosis not present

## 2022-07-03 DIAGNOSIS — M25551 Pain in right hip: Secondary | ICD-10-CM | POA: Diagnosis not present

## 2022-07-03 DIAGNOSIS — M9901 Segmental and somatic dysfunction of cervical region: Secondary | ICD-10-CM | POA: Diagnosis not present

## 2022-07-03 DIAGNOSIS — M9903 Segmental and somatic dysfunction of lumbar region: Secondary | ICD-10-CM | POA: Diagnosis not present

## 2022-07-09 ENCOUNTER — Encounter: Payer: Self-pay | Admitting: Family Medicine

## 2022-07-09 ENCOUNTER — Telehealth (INDEPENDENT_AMBULATORY_CARE_PROVIDER_SITE_OTHER): Payer: PPO | Admitting: Family Medicine

## 2022-07-09 VITALS — BP 115/70 | HR 101 | Temp 99.4°F | Ht 73.0 in | Wt 217.0 lb

## 2022-07-09 DIAGNOSIS — U071 COVID-19: Secondary | ICD-10-CM | POA: Diagnosis not present

## 2022-07-09 MED ORDER — NIRMATRELVIR/RITONAVIR (PAXLOVID)TABLET: 3.0000 | ORAL_TABLET | Freq: Two times a day (BID) | ORAL | 0 refills | Status: DC

## 2022-07-09 NOTE — Progress Notes (Signed)
Virtual Visit via Video Note  I connected with Paul Tucker on 07/09/22 at 10:40 AM EST by a video enabled telemedicine application and verified that I am speaking with the correct person using two identifiers.  Patient Location: Home accompanied by his wife, who he is okay with being present. Provider Location: Office/Clinic  I discussed the limitations, risks, security, and privacy concerns of performing an evaluation and management service by video and the availability of in person appointments. I also discussed with the patient that there may be a patient responsible charge related to this service. The patient expressed understanding and agreed to proceed.  Subjective: PCP: Biagio Borg, MD  Chief Complaint  Patient presents with   Covid Positive    Cough started sat., fever - low grade started today. Covid pos today - take OTC Delsym and mucinex    Patient complains of cough, head congestion, runny nose, fever, and postnasal drainage. Max temp 99.0.  He denies chest congestion, headache, sore throat, shortness of breath, wheezing, vomiting, abdominal pain, and diarrhea. Onset of symptoms was 2 days ago, gradually improving since that time. He is drinking plenty of fluids. Evaluation to date: home COVID test positive this morning. Treatment to date: cough suppressants and Mucinex . He does not smoke.    ROS: Per HPI  Current Outpatient Medications:    amantadine (SYMMETREL) 100 MG capsule, Take 100 mg by mouth 2 (two) times daily., Disp: , Rfl:    aspirin 81 MG EC tablet, Take 81 mg by mouth daily., Disp: , Rfl:    Cholecalciferol (VITAMIN D3 PO), Take 4,000 Units by mouth., Disp: , Rfl:    Ciclopirox 0.77 % gel, USE AS DIRECTED TOPCIALLY, Disp: 45 g, Rfl: 1   cyclobenzaprine (FLEXERIL) 5 MG tablet, Take 1 tablet (5 mg total) by mouth 3 (three) times daily as needed for muscle spasms., Disp: 40 tablet, Rfl: 2   diclofenac Sodium (VOLTAREN) 1 % GEL, Apply 4 g topically in the  morning and at bedtime., Disp: , Rfl:    diphenhydrAMINE (BENADRYL) 25 MG tablet, Take 25 mg by mouth every 6 (six) hours as needed for allergies., Disp: , Rfl:    fluocinonide cream (LIDEX) [id number] %, Apply 1 Application topically 2 (two) times daily., Disp: 30 g, Rfl: 1   LORazepam (ATIVAN) 0.5 MG tablet, Take 0.5 mg by mouth 2 (two) times daily as needed., Disp: , Rfl:    lovastatin (MEVACOR) 40 MG tablet, TAKE ONE TABLET BY MOUTH EVERY NIGHT AT BEDTIME, Disp: 90 tablet, Rfl: 3   meclizine (ANTIVERT) 25 MG tablet, TAKE ONE TABLET BY MOUTH THREE TIMES A DAY AS NEEDED FOR DIZZINESS, Disp: 30 tablet, Rfl: 2   meloxicam (MOBIC) 15 MG tablet, Take 1 tablet (15 mg total) by mouth daily as needed for pain., Disp: 90 tablet, Rfl: 3   mometasone (ELOCON) 0.1 % cream, APPLY TO AFFECTED AREA(S) DAILY, Disp: 45 g, Rfl: 0   OLANZapine (ZYPREXA) 2.5 MG tablet, Take 2.5 mg by mouth at bedtime., Disp: , Rfl:    tamsulosin (FLOMAX) 0.4 MG CAPS capsule, TAKE ONE CAPSULE BY MOUTH DAILY, Disp: 90 capsule, Rfl: 3   TRINTELLIX 20 MG TABS tablet, Take 20 mg by mouth daily., Disp: , Rfl:    zolpidem (AMBIEN) 10 MG tablet, TAKE ONE TABLET BY MOUTH EVERY NIGHT AT BEDTIME, Disp: 90 tablet, Rfl: 1  Observations/Objective: Today's Vitals   07/09/22 0846  BP: 115/70  Pulse: (!) 101  Temp: 99.4 F (37.4  C)  Weight: 217 lb (98.4 kg)  Height: 6' 1"$  (1.854 m)   Physical Exam Constitutional:      General: He is not in acute distress.    Appearance: Normal appearance. He is not ill-appearing or toxic-appearing.  Eyes:     General: No scleral icterus.       Right eye: No discharge.        Left eye: No discharge.     Conjunctiva/sclera: Conjunctivae normal.  Pulmonary:     Effort: Pulmonary effort is normal. No respiratory distress.  Neurological:     Mental Status: He is alert and oriented to person, place, and time.  Psychiatric:        Mood and Affect: Mood normal.        Behavior: Behavior normal.         Thought Content: Thought content normal.        Judgment: Judgment normal.     Assessment and Plan: 1. COVID-19 Education provided on COVID-19.  Discussed signs and symptoms that should prompt him to go to the ER.  Advised a 5-day quarantine and wearing a mask up to 14 days when going out in public.  Recommended symptom management including Mucinex, Delsym, Tylenol, and a humidifier with Vicks. Medication reactions checked with the [place] COVID-19 drug interactions: patient was advised to hold lovastatin and tamsulosin during and 3 days after taking Paxlovid; also advised to only take half tablet of Ambien at bedtime. - nirmatrelvir/ritonavir (PAXLOVID) 20 x 150 MG & 10 x 100MG TABS; Take 3 tablets by mouth 2 (two) times daily for 5 days. (Take nirmatrelvir 150 mg two tablets twice daily for 5 days and ritonavir 100 mg one tablet twice daily for 5 days) Patient GFR is 77  Dispense: 30 tablet; Refill: 0   Follow Up Instructions: Return if symptoms worsen or fail to improve.   I discussed the assessment and treatment plan with the patient. The patient was provided an opportunity to ask questions, and all were answered. The patient agreed with the plan and demonstrated an understanding of the instructions.   The patient was advised to call back or seek an in-person evaluation if the symptoms worsen or if the condition fails to improve as anticipated.  The above assessment and management plan was discussed with the patient. The patient verbalized understanding of and has agreed to the management plan.   Loman Brooklyn, FNP

## 2022-07-10 ENCOUNTER — Other Ambulatory Visit: Payer: Self-pay | Admitting: Internal Medicine

## 2022-07-10 MED ORDER — NIRMATRELVIR/RITONAVIR (PAXLOVID)TABLET: 3.0000 | ORAL_TABLET | Freq: Two times a day (BID) | ORAL | 0 refills | Status: AC

## 2022-07-10 MED ORDER — HYDROCODONE BIT-HOMATROP MBR 5-1.5 MG/5ML PO SOLN: 5.0000 mL | Freq: Four times a day (QID) | ORAL | 0 refills | Status: AC | PRN

## 2022-07-11 ENCOUNTER — Encounter: Payer: Self-pay | Admitting: Internal Medicine

## 2022-07-11 MED ORDER — ONDANSETRON 4 MG PO TBDP: 4.0000 mg | ORAL_TABLET | Freq: Three times a day (TID) | ORAL | 0 refills | Status: DC | PRN

## 2022-07-23 ENCOUNTER — Telehealth: Payer: PPO | Admitting: Internal Medicine

## 2022-07-27 ENCOUNTER — Other Ambulatory Visit (INDEPENDENT_AMBULATORY_CARE_PROVIDER_SITE_OTHER): Payer: PPO

## 2022-07-27 DIAGNOSIS — E538 Deficiency of other specified B group vitamins: Secondary | ICD-10-CM | POA: Diagnosis not present

## 2022-07-27 DIAGNOSIS — E559 Vitamin D deficiency, unspecified: Secondary | ICD-10-CM

## 2022-07-27 DIAGNOSIS — Z125 Encounter for screening for malignant neoplasm of prostate: Secondary | ICD-10-CM | POA: Diagnosis not present

## 2022-07-27 DIAGNOSIS — R1013 Epigastric pain: Secondary | ICD-10-CM

## 2022-07-27 DIAGNOSIS — E1340 Other specified diabetes mellitus with diabetic neuropathy, unspecified: Secondary | ICD-10-CM | POA: Diagnosis not present

## 2022-07-27 LAB — CBC WITH DIFFERENTIAL/PLATELET
Basophils Absolute: 0 10*3/uL (ref 0.0–0.1)
Basophils Relative: 0.9 % (ref 0.0–3.0)
Eosinophils Absolute: 0.2 10*3/uL (ref 0.0–0.7)
Eosinophils Relative: 3.9 % (ref 0.0–5.0)
HCT: 38.8 % — ABNORMAL LOW (ref 39.0–52.0)
Hemoglobin: 13.7 g/dL (ref 13.0–17.0)
Lymphocytes Relative: 19.9 % (ref 12.0–46.0)
Lymphs Abs: 1.1 10*3/uL (ref 0.7–4.0)
MCHC: 35.4 g/dL (ref 30.0–36.0)
MCV: 96.6 fl (ref 78.0–100.0)
Monocytes Absolute: 0.5 10*3/uL (ref 0.1–1.0)
Monocytes Relative: 8.3 % (ref 3.0–12.0)
Neutro Abs: 3.7 10*3/uL (ref 1.4–7.7)
Neutrophils Relative %: 67 % (ref 43.0–77.0)
Platelets: 252 10*3/uL (ref 150.0–400.0)
RBC: 4.02 Mil/uL — ABNORMAL LOW (ref 4.22–5.81)
RDW: 12.9 % (ref 11.5–15.5)
WBC: 5.5 10*3/uL (ref 4.0–10.5)

## 2022-07-27 LAB — URINALYSIS, ROUTINE W REFLEX MICROSCOPIC
Bilirubin Urine: NEGATIVE
Hgb urine dipstick: NEGATIVE
Ketones, ur: NEGATIVE
Leukocytes,Ua: NEGATIVE
Nitrite: NEGATIVE
Specific Gravity, Urine: 1.02 (ref 1.000–1.030)
Total Protein, Urine: NEGATIVE
Urine Glucose: NEGATIVE
Urobilinogen, UA: 1 (ref 0.0–1.0)
pH: 6 (ref 5.0–8.0)

## 2022-07-27 LAB — HEMOGLOBIN A1C: Hgb A1c MFr Bld: 6.4 % (ref 4.6–6.5)

## 2022-07-27 LAB — MICROALBUMIN / CREATININE URINE RATIO
Creatinine,U: 130.3 mg/dL
Microalb Creat Ratio: 1.4 mg/g (ref 0.0–30.0)
Microalb, Ur: 1.8 mg/dL (ref 0.0–1.9)

## 2022-07-27 LAB — BASIC METABOLIC PANEL
BUN: 17 mg/dL (ref 6–23)
CO2: 24 mEq/L (ref 19–32)
Calcium: 9.6 mg/dL (ref 8.4–10.5)
Chloride: 104 mEq/L (ref 96–112)
Creatinine, Ser: 1.21 mg/dL (ref 0.40–1.50)
GFR: 60.52 mL/min (ref >60.00)
Glucose, Bld: 156 mg/dL — ABNORMAL HIGH (ref 70–99)
Potassium: 4.1 mEq/L (ref 3.5–5.1)
Sodium: 141 mEq/L (ref 135–145)

## 2022-07-27 LAB — VITAMIN D 25 HYDROXY (VIT D DEFICIENCY, FRACTURES): VITD: 34.16 ng/mL (ref 30.00–100.00)

## 2022-07-27 LAB — HEPATIC FUNCTION PANEL
ALT: 22 U/L (ref 0–53)
AST: 17 U/L (ref 0–37)
Albumin: 4.2 g/dL (ref 3.5–5.2)
Alkaline Phosphatase: 72 U/L (ref 39–117)
Bilirubin, Direct: 0.3 mg/dL (ref 0.0–0.3)
Total Bilirubin: 1 mg/dL (ref 0.2–1.2)
Total Protein: 6.9 g/dL (ref 6.0–8.3)

## 2022-07-27 LAB — PSA: PSA: 6.01 ng/mL — ABNORMAL HIGH (ref 0.10–4.00)

## 2022-07-27 LAB — LIPID PANEL
Cholesterol: 125 mg/dL (ref 0–200)
HDL: 44.2 mg/dL (ref >39.00)
LDL Cholesterol: 59 mg/dL (ref 0–99)
NonHDL: 80.52
Total CHOL/HDL Ratio: 3
Triglycerides: 108 mg/dL (ref 0.0–149.0)
VLDL: 21.6 mg/dL (ref 0.0–40.0)

## 2022-07-27 LAB — VITAMIN B12: Vitamin B-12: 354 pg/mL (ref 211–911)

## 2022-07-27 LAB — TSH: TSH: 1.55 u[IU]/mL (ref 0.35–5.50)

## 2022-07-27 LAB — H. PYLORI ANTIBODY, IGG: H Pylori IgG: NEGATIVE

## 2022-07-31 ENCOUNTER — Encounter: Payer: Self-pay | Admitting: Internal Medicine

## 2022-07-31 ENCOUNTER — Ambulatory Visit (INDEPENDENT_AMBULATORY_CARE_PROVIDER_SITE_OTHER): Payer: PPO | Admitting: Internal Medicine

## 2022-07-31 VITALS — BP 126/78 | HR 76 | Temp 98.3°F | Ht 73.0 in | Wt 208.0 lb

## 2022-07-31 DIAGNOSIS — M545 Low back pain, unspecified: Secondary | ICD-10-CM | POA: Diagnosis not present

## 2022-07-31 DIAGNOSIS — E559 Vitamin D deficiency, unspecified: Secondary | ICD-10-CM | POA: Diagnosis not present

## 2022-07-31 DIAGNOSIS — R972 Elevated prostate specific antigen [PSA]: Secondary | ICD-10-CM

## 2022-07-31 DIAGNOSIS — Z0000 Encounter for general adult medical examination without abnormal findings: Secondary | ICD-10-CM | POA: Diagnosis not present

## 2022-07-31 DIAGNOSIS — Z23 Encounter for immunization: Secondary | ICD-10-CM | POA: Diagnosis not present

## 2022-07-31 DIAGNOSIS — E7800 Pure hypercholesterolemia, unspecified: Secondary | ICD-10-CM

## 2022-07-31 DIAGNOSIS — E1340 Other specified diabetes mellitus with diabetic neuropathy, unspecified: Secondary | ICD-10-CM

## 2022-07-31 MED ORDER — CELECOXIB 200 MG PO CAPS: 200.0000 mg | ORAL_CAPSULE | Freq: Two times a day (BID) | ORAL | 1 refills | Status: DC | PRN

## 2022-07-31 MED ORDER — DOXYCYCLINE HYCLATE 100 MG PO TABS: 100.0000 mg | ORAL_TABLET | Freq: Two times a day (BID) | ORAL | 0 refills | Status: DC

## 2022-07-31 NOTE — Patient Instructions (Signed)
You had the Prevnar 20 pneumonia shot today  Please take all new medication as prescribed - the celebrex for arthritis as needed, and the antibiotic  Please continue all other medications as before, and refills have been done if requested.  Please have the pharmacy call with any other refills you may need.  Please continue your efforts at being more active, low cholesterol diet, and weight control.  You are otherwise up to date with prevention measures today.  Please keep your appointments with your specialists as you may have planned  Please make an Appointment to return in 6 months, or sooner if needed, also with Lab Appointment for testing done 3-5 days before at the [facility] (so this is for TWO appointments - please see the scheduling desk as you leave)

## 2022-07-31 NOTE — Progress Notes (Unsigned)
Patient ID: Paul Tucker, male   DOB: 1950/09/16, 72 y.o.   MRN: [id number]         Chief Complaint:: wellness exam and Medical Management of Chronic Issues (63mofollow up/-stopped meloxicam due to unclear vision/-abdominal issues since having covid 3-4 weeks ago/-vision concerns, hard to focus, seeing eye doctor soon)  , arthritis low back pain, low vit d, elevated psa, dm, hld       HPI:  Paul URSINOis a 72y.o. male here for wellness exam; declines tdap, covid booster, for prevnar 20 today, o/w up to date                        Also Denies urinary symptoms such as dysuria, frequency, urgency, flank pain, hematuria or n/v, fever, chills. Has f/u appt with Urologist Dr EJunious Silkin 1 mo.   Taking low dose Vit D .  Has ongoing arthritis multiple joint including back and knees, where mobic is not working well in last month.  Pt denies chest pain, increased sob or doe, wheezing, orthopnea, PND, increased LE swelling, palpitations, dizziness or syncope.   Pt denies polydipsia, polyuria, or new focal neuro s/s.    Pt denies fever, wt loss, night sweats, loss of appetite, or other constitutional symptoms     Wt Readings from Last 3 Encounters:  07/31/22 208 lb (94.3 kg)  07/09/22 217 lb (98.4 kg)  04/30/22 217 lb 3.2 oz (98.5 kg)   BP Readings from Last 3 Encounters:  07/31/22 126/78  07/09/22 115/70  04/30/22 116/60   Immunization History  Administered Date(s) Administered   Fluad Quad(high Dose 65+) 02/28/2019, 03/09/2020, 03/03/2021, 02/26/2022   Influenza Whole 02/28/2006, 04/04/2007   Influenza, High Dose Seasonal PF 03/06/2017, 02/07/2018   Influenza,inj,Quad PF,6+ Mos 02/25/2014, 03/10/2015, 03/13/2016   Influenza-Unspecified 02/19/2012   PFIZER(Purple Top)SARS-COV-2 Vaccination 06/25/2019, 07/20/2019, 02/15/2020   PNEUMOCOCCAL CONJUGATE-20 07/31/2022   Pfizer Covid-19 Vaccine Bivalent Booster 160yr& up 02/09/2021   Pneumococcal Conjugate-13 03/06/2017   Pneumococcal  Polysaccharide-23 02/25/2014, 07/20/2020   Td 12/02/2008   Zoster Recombinat (Shingrix) 07/24/2018, 11/26/2018   Zoster, Live 12/04/2011   Health Maintenance Due  Topic Date Due   DTaP/Tdap/Td (2 - Tdap) 12/03/2018      Past Medical History:  Diagnosis Date   Acute prostatitis 04/04/2007   ADD 01/17/2007   Anxiety    DEPRESSION 01/17/2007   Treatment Resident Depression   Dermatophytosis of groin and perianal area 05/31/2008   DIVERTICULOSIS, COLON 04/06/2007   Dysuria 01/27/2009   FLANK PAIN, LEFT 09/30/2007   Flushing 08/26/2009   GLUCOSE INTOLERANCE 04/04/2007   Gross hematuria 01/27/2009   HEMATOCHEZIA 07/26/2008   HYPERLIPIDEMIA 01/20/2007   HYPOGONADISM 06/13/2009   Impaired glucose tolerance 12/10/2010   INSOMNIA 04/04/2007   LIBIDO, DECREASED [id number]/A999333 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   LOW BACK PAIN 01/20/2007   OBSTRUCTIVE SLEEP APNEA 04/04/2007   OTITIS MEDIA, ACUTE, BILATERAL 06/13/2009   PANCREATITIS, HX OF 01/17/2007   PSA, INCREASED 11/18/2009   RASH-NONVESICULAR 08/26/2009   SINUSITIS- ACUTE-NOS 04/04/2007   SWEATING 09/30/2007   THRUSH 04/04/2007   TONSILLECTOMY AND ADENOIDECTOMY, HX OF 01/17/2007   URI 07/25/2010   URINARY RETENTION 01/27/2009   VERTIGO 06/13/2009   Past Surgical History:  Procedure Laterality Date   CHOLECYSTECTOMY  2005   HEMORRHOID BANDING  2015   HERNIA REPAIR  1957   INGUINAL HERNIA REPAIR Right 07/27/2020   Procedure: RIGHT INGUINAL HERNIA REPAIR WITH MESH;  Surgeon: Erroll Luna, MD;  Location: [facility];  Service: General;  Laterality: Right;   [place]    reports that he has never smoked. He has never used smokeless tobacco. He reports that he does not drink alcohol and does not use drugs. family history includes Coronary artery disease in an other family member; Diabetes in an other family member; Emphysema in his father; Heart disease in his father. Allergies  Allergen Reactions   Atrovent [Ipratropium] Swelling     Throat swelling   Pravastatin Sodium Nausea Only    Pt does not recall this reaction   Current Outpatient Medications on File Prior to Visit  Medication Sig Dispense Refill   amantadine (SYMMETREL) 100 MG capsule Take 100 mg by mouth 2 (two) times daily.     aspirin 81 MG EC tablet Take 81 mg by mouth daily.     Cholecalciferol (VITAMIN D3 PO) Take 4,000 Units by mouth.     Ciclopirox 0.77 % gel USE AS DIRECTED TOPCIALLY 45 g 1   cyclobenzaprine (FLEXERIL) 5 MG tablet Take 1 tablet (5 mg total) by mouth 3 (three) times daily as needed for muscle spasms. 40 tablet 2   diclofenac Sodium (VOLTAREN) 1 % GEL Apply 4 g topically in the morning and at bedtime.     diphenhydrAMINE (BENADRYL) 25 MG tablet Take 25 mg by mouth every 6 (six) hours as needed for allergies.     fluocinonide cream (LIDEX) [id number] % Apply 1 Application topically 2 (two) times daily. 30 g 1   LORazepam (ATIVAN) 0.5 MG tablet Take 0.5 mg by mouth 2 (two) times daily as needed.     lovastatin (MEVACOR) 40 MG tablet TAKE ONE TABLET BY MOUTH EVERY NIGHT AT BEDTIME 90 tablet 3   meclizine (ANTIVERT) 25 MG tablet TAKE ONE TABLET BY MOUTH THREE TIMES A DAY AS NEEDED FOR DIZZINESS 30 tablet 2   mometasone (ELOCON) 0.1 % cream APPLY TO AFFECTED AREA(S) DAILY 45 g 0   OLANZapine (ZYPREXA) 2.5 MG tablet Take 2.5 mg by mouth at bedtime.     ondansetron (ZOFRAN-ODT) 4 MG disintegrating tablet Take 1 tablet (4 mg total) by mouth every 8 (eight) hours as needed for nausea or vomiting. 20 tablet 0   tamsulosin (FLOMAX) 0.4 MG CAPS capsule TAKE ONE CAPSULE BY MOUTH DAILY 90 capsule 3   TRINTELLIX 20 MG TABS tablet Take 20 mg by mouth daily.     zolpidem (AMBIEN) 10 MG tablet TAKE ONE TABLET BY MOUTH EVERY NIGHT AT BEDTIME 90 tablet 1   No current facility-administered medications on file prior to visit.        ROS:  All others reviewed and negative.  Objective        PE:  BP 126/78   Pulse 76   Temp 98.3 F (36.8 C) (Oral)   Ht '6\' 1"'$   (1.854 m)   Wt 208 lb (94.3 kg)   SpO2 94%   BMI 27.44 kg/m                 Constitutional: Pt appears in NAD               HENT: Head: NCAT.                Right Ear: External ear normal.                 Left Ear: External ear normal.  Eyes: . Pupils are equal, round, and reactive to light. Conjunctivae and EOM are normal               Nose: without d/c or deformity               Neck: Neck supple. Gross normal ROM               Cardiovascular: Normal rate and regular rhythm.                 Pulmonary/Chest: Effort normal and breath sounds without rales or wheezing.                Abd:  Soft, NT, ND, + BS, no organomegaly               Neurological: Pt is alert. At baseline orientation, motor grossly intact               Skin: Skin is warm. No rashes, no other new lesions, LE edema - none               Psychiatric: Pt behavior is normal without agitation   Micro: none  Cardiac tracings I have personally interpreted today:  none  Pertinent Radiological findings (summarize): none   Lab Results  Component Value Date   WBC 5.5 07/27/2022   HGB 13.7 07/27/2022   HCT 38.8 (L) 07/27/2022   PLT 252.0 07/27/2022   GLUCOSE 156 (H) 07/27/2022   CHOL 125 07/27/2022   TRIG 108.0 07/27/2022   HDL 44.20 07/27/2022   LDLDIRECT 82.0 02/28/2017   LDLCALC 59 07/27/2022   ALT 22 07/27/2022   AST 17 07/27/2022   NA 141 07/27/2022   K 4.1 07/27/2022   CL 104 07/27/2022   CREATININE 1.21 07/27/2022   BUN 17 07/27/2022   CO2 24 07/27/2022   TSH 1.55 07/27/2022   PSA 6.01 (H) 07/27/2022   HGBA1C 6.4 07/27/2022   MICROALBUR 1.8 07/27/2022   Assessment/Plan:  ANTONEY BELEW is a 72 y.o. White or Caucasian [1] male with  has a past medical history of Acute prostatitis (04/04/2007), ADD (01/17/2007), Anxiety, DEPRESSION (01/17/2007), Dermatophytosis of groin and perianal area (05/31/2008), DIVERTICULOSIS, COLON (04/06/2007), Dysuria (01/27/2009), FLANK PAIN, LEFT (09/30/2007), Flushing  (08/26/2009), GLUCOSE INTOLERANCE (04/04/2007), Gross hematuria (01/27/2009), HEMATOCHEZIA (07/26/2008), HYPERLIPIDEMIA (01/20/2007), HYPOGONADISM (06/13/2009), Impaired glucose tolerance (12/10/2010), INSOMNIA (04/04/2007), LIBIDO, DECREASED (A999333), LICHEN SIMPLEX CHRONICUS (05/31/2008), LOW BACK PAIN (01/20/2007), OBSTRUCTIVE SLEEP APNEA (04/04/2007), OTITIS MEDIA, ACUTE, BILATERAL (06/13/2009), PANCREATITIS, HX OF (01/17/2007), PSA, INCREASED (11/18/2009), RASH-NONVESICULAR (08/26/2009), SINUSITIS- ACUTE-NOS (04/04/2007), SWEATING (09/30/2007), THRUSH (04/04/2007), TONSILLECTOMY AND ADENOIDECTOMY, HX OF (01/17/2007), URI (07/25/2010), URINARY RETENTION (01/27/2009), and VERTIGO (06/13/2009).  Encounter for well adult exam without abnormal findings Age and sex appropriate education and counseling updated with regular exercise and diet Referrals for preventative services - none needed Immunizations addressed - for prevnar 20, declines tdap and covid booster Smoking counseling  - none needed Evidence for depression or other mood disorder - none significant Most recent labs reviewed. 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,  Lab Results  Component Value Date   HGBA1C 6.4 07/27/2022   Stable, pt to continue current medical treatment  - diet, wt control   Hyperlipidemia Lab Results  Component Value Date   LDLCALC 9  07/27/2022   Stable, pt to continue current statin lovastatin 40 mg qd   PSA, INCREASED Sudden worsening,  Lab Results  Component Value Date   PSA 6.01 (H) 07/27/2022   PSA 4.30 (H) 07/18/2021   PSA 4.30 (H) 01/19/2021   For doxycycline 100 bid, and pt to f/u urology as planned with repeat PSA  Vitamin D deficiency Last vitamin D Lab Results   Component Value Date   VD25OH 34.16 07/27/2022   Low, to double up increesed oral replacement   LOW BACK PAIN Ok for change mobic to celebrex 200 bid prn,  to f/u any worsening symptoms or concerns  Followup: No follow-ups on file.  Cathlean Cower, MD 08/02/2022 8:28 PM [facility]

## 2022-08-02 NOTE — Assessment & Plan Note (Signed)
[place] for change mobic to celebrex 200 bid prn,  to f/u any worsening symptoms or concerns

## 2022-08-02 NOTE — Assessment & Plan Note (Addendum)
Last vitamin D Lab Results  Component Value Date   VD25OH 34.16 07/27/2022   Low, to double up increesed oral replacement

## 2022-08-02 NOTE — Assessment & Plan Note (Signed)
Lab Results  Component Value Date   LDLCALC 59 07/27/2022   Stable, pt to continue current statin lovastatin 40 mg qd

## 2022-08-02 NOTE — Assessment & Plan Note (Signed)
With neuropathy ongoing,  Lab Results  Component Value Date   HGBA1C 6.4 07/27/2022   Stable, pt to continue current medical treatment  - diet, wt control

## 2022-08-02 NOTE — Assessment & Plan Note (Signed)
Sudden worsening,  Lab Results  Component Value Date   PSA 6.01 (H) 07/27/2022   PSA 4.30 (H) 07/18/2021   PSA 4.30 (H) 01/19/2021   For doxycycline 100 bid, and pt to f/u urology as planned with repeat PSA

## 2022-08-02 NOTE — Assessment & Plan Note (Signed)
Age and sex appropriate education and counseling updated with regular exercise and diet Referrals for preventative services - none needed Immunizations addressed - for prevnar 20, declines tdap and covid booster Smoking counseling  - none needed Evidence for depression or other mood disorder - none significant Most recent labs reviewed. I have personally reviewed and have noted: 1) the patient's medical and social history 2) The patient's current medications and supplements 3) The patient's height, weight, and BMI have been recorded in the chart

## 2022-08-08 ENCOUNTER — Ambulatory Visit: Payer: PPO | Admitting: Internal Medicine

## 2022-08-29 NOTE — Progress Notes (Signed)
Cardiology Office Note:    Date:  09/07/2022   ID:  Paul Tucker, DOB 10-Aug-1950, MRN [id number]  PCP:  Corwin Levins, MD  Cardiologist:  Little Ishikawa, MD  Electrophysiologist:  None   Referring MD: Corwin Levins, MD   Chief Complaint  Patient presents with   Coronary Artery Disease    History of Present Illness:    Paul Tucker is a 72 y.o. male with a hx of hyperlipidemia, ADHD, depression, OSA who presents for follow-up.  He was referred by Dr. Jonny Ruiz for evaluation of CAD, initially seen on 03/02/2021.  Underwent calcium score 02/09/2021, which was 1216 (89th percentile).  He had previous history of OSA but lost 50 pounds and is now off CPAP.  No smoking history.  Father had MI in 64s and had ICD.  Lexiscan Myoview on 03/08/2021 showed normal perfusion, EF 63%.  Since last clinic visit, he reports he has been doing well.  Denies any chest pain, dyspnea, lower extremity edema, or palpitations.  Reports some lightheadedness with standing but denies any syncope.  He has been going to the gym 3 times a week for an hour, does cardio and weights.  Denies any exertional symptoms.   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   LOW BACK PAIN 01/20/2007   OBSTRUCTIVE SLEEP APNEA 04/04/2007   OTITIS MEDIA, ACUTE, BILATERAL 06/13/2009   PANCREATITIS, HX OF 01/17/2007   PSA, INCREASED 11/18/2009   RASH-NONVESICULAR 08/26/2009   SINUSITIS- ACUTE-NOS  04/04/2007   SWEATING 09/30/2007   THRUSH 04/04/2007   TONSILLECTOMY AND ADENOIDECTOMY, HX OF 01/17/2007   URI 07/25/2010   URINARY RETENTION 01/27/2009   VERTIGO 06/13/2009    Past Surgical History:  Procedure Laterality Date   CHOLECYSTECTOMY  2005   HEMORRHOID BANDING  2015   HERNIA REPAIR  1957   INGUINAL HERNIA REPAIR Right 07/27/2020   Procedure: RIGHT INGUINAL HERNIA REPAIR WITH MESH;  Surgeon: Harriette Bouillon, MD;  Location: [facility];  Service: General;  Laterality: Right;   TONSILLECTOMY     VASECTOMY  1987    Current Medications: Current Meds  Medication Sig   amantadine (SYMMETREL) 100 MG capsule Take 100 mg by mouth 2 (two) times daily.   aspirin 81 MG EC tablet Take 81 mg by mouth daily.   celecoxib (CELEBREX) 200 MG capsule Take 1 capsule (200 mg total) by mouth 2 (two) times daily as needed.   Cholecalciferol (VITAMIN D3 PO) Take 4,000 Units by mouth.   Ciclopirox 0.77 % gel USE AS DIRECTED TOPCIALLY   cyclobenzaprine (FLEXERIL) 5 MG tablet Take 1 tablet (5 mg total) by mouth 3 (three) times daily as needed for muscle spasms.   diphenhydrAMINE (BENADRYL) 25 MG tablet Take 25 mg by mouth every 6 (six) hours as needed for allergies.   doxycycline (VIBRA-TABS) 100 MG tablet Take 1 tablet (100 mg total) by mouth 2 (two) times daily.   fluocinonide cream (LIDEX) 0.05 % Apply  1 Application topically 2 (two) times daily.   LORazepam (ATIVAN) 0.5 MG tablet Take 0.5 mg by mouth 2 (two) times daily as needed.   lovastatin (MEVACOR) 40 MG tablet TAKE ONE TABLET BY MOUTH EVERY NIGHT AT BEDTIME   meclizine (ANTIVERT) 25 MG tablet TAKE ONE TABLET BY MOUTH THREE TIMES A DAY AS NEEDED FOR DIZZINESS   mometasone (ELOCON) 0.1 % cream APPLY TO AFFECTED AREA(S) DAILY   OLANZapine (ZYPREXA) 2.5 MG tablet Take 2.5 mg by mouth at bedtime.   tamsulosin (FLOMAX) 0.4 MG CAPS capsule TAKE ONE CAPSULE BY MOUTH DAILY   TRINTELLIX 20 MG TABS tablet Take 20 mg by mouth daily.   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  Tobacco Use   Smoking status: Never   Smokeless tobacco: Never  Vaping Use   Vaping Use: Never used  Substance and Sexual Activity   Alcohol use: Never    Alcohol/week: 0.0 standard drinks of alcohol   Drug use: No   Sexual activity: Not on file  Other Topics Concern   Not on file  Social History Narrative   Not on file   Social Determinants of Health   Financial Resource Strain: Low Risk  (04/30/2022)   Overall Financial Resource Strain (CARDIA)    Difficulty of Paying Living Expenses: Not hard at all  Food Insecurity: No Food Insecurity (04/30/2022)   Hunger Vital Sign    Worried About Running Out of Food in the Last Year: Never true    Ran Out of Food in the Last Year: Never true  Transportation Needs: No Transportation Needs (04/30/2022)   PRAPARE - Administrator, Civil ServiceTransportation    Lack of Transportation (Medical): No    Lack of Transportation (Non-Medical): No  Physical Activity: Inactive (04/30/2022)   Exercise Vital Sign    Days of Exercise per Week: 0 days    Minutes of Exercise per Session: 0 min  Stress: No Stress Concern Present (04/30/2022)   [employer] - Occupational Stress Questionnaire    Feeling of Stress : Not at all  Social Connections: Moderately Isolated (04/30/2022)   Social Connection and Isolation Panel [NHANES]    Frequency of Communication with Friends and Family: Once a week    Frequency of Social Gatherings with Friends and Family: Once a week    Attends Religious Services: More than 4 times per year    Active Member of [employer] Financial[employer]ubs or Organizations: No    Attends  Engineer, structuralClub or Organization Meetings: Never    Marital Status: Married     Family History: The patient's family history includes Coronary artery disease in an other family member; Diabetes in an other family member; Emphysema in his father; Heart disease in his father. There is no history of Colon cancer, Esophageal cancer, Rectal cancer, or Stomach cancer.  ROS:   Please see the history of present illness.     All other systems reviewed and are negative.  EKGs/Labs/Other Studies Reviewed:    The following studies were reviewed today:   EKG:   09/04/2021: Normal sinus rhythm, rate 69, no  ST abnormalities 09/07/22: Normal sinus rhythm, rate 70, no ST abnormality  Recent Labs: 07/27/2022: ALT 22; BUN 17; Creatinine, Ser 1.21; Hemoglobin 13.7; Platelets 252.0; Potassium 4.1; Sodium 141; TSH 1.55  Recent Lipid Panel    Component Value Date/Time   CHOL 125 07/27/2022 0836   TRIG 108.0 07/27/2022 0836   HDL 44.20 07/27/2022 0836   CHOLHDL 3 07/27/2022 0836   VLDL 21.6 07/27/2022 0836   LDLCALC 59 07/27/2022 0836   LDLDIRECT 82.0 02/28/2017 0725    Physical Exam:    VS:  BP 136/68 (BP Location: Right Arm, Patient Position: Sitting, Cuff Size: Normal)   Pulse 70   Ht 6\' 1"  (1.854 m)   Wt 211 lb 3.2 oz (95.8 kg)   SpO2 96%   BMI 27.86 kg/m     Wt Readings from Last 3 Encounters:  09/07/22 211 lb 3.2 oz (95.8 kg)  07/31/22 208 lb (94.3 kg)  07/09/22 217 lb (98.4 kg)     GEN:  Well nourished, well developed in no acute distress HEENT: Normal NECK: No JVD; No carotid bruits LYMPHATICS: No lymphadenopathy CARDIAC: RRR, no murmurs, rubs, gallops RESPIRATORY:  Clear to auscultation without rales, wheezing or rhonchi  ABDOMEN: Soft, non-tender, non-distended MUSCULOSKELETAL:  No edema; No deformity  SKIN: Warm and dry NEUROLOGIC:  Alert and oriented x 3 PSYCHIATRIC:  Normal affect   ASSESSMENT:    1. Coronary artery disease involving native coronary artery of native heart without  angina pectoris   2. Hyperlipidemia, unspecified hyperlipidemia type   3. Prediabetes      PLAN:    CAD: Underwent calcium score 02/09/2021, which was 1216 (89th percentile).  Denies anginal symptoms.  Lexiscan Myoview on 03/08/2021 showed normal perfusion, EF 63%. -Continue aspirin 81 mg daily -Continue lovastatin 40 mg.  LDL 59, at goal <70  Hyperlipidemia: On lovastatin 40 mg daily.  LDL 59 on 07/27/2022  Prediabetes: A1c 6.4% on 07/27/22  RTC in 1 year    Medication Adjustments/Labs and Tests Ordered: Current medicines are reviewed at length with the patient today.  Concerns regarding medicines are outlined above.  Orders Placed This Encounter  Procedures   EKG 12-Lead    No orders of the defined types were placed in this encounter.    Patient Instructions  Medication Instructions:  Your physician recommends that you continue on your current medications as directed. Please refer to the Current Medication list given to you today.  *If you need a refill on your cardiac medications before your next appointment, please call your pharmacy*   Follow-Up: At [facility], you and your health needs are our priority.  As part of our continuing mission to provide you with exceptional heart care, we have created designated Provider Care Teams.  These Care Teams include your primary Cardiologist (physician) and Advanced Practice Providers (APPs -  Physician Assistants and Nurse Practitioners) who all work together to provide you with the care you need, when you need it.  We recommend signing up for the patient portal called "MyChart".  Sign up information is provided on this After Visit Summary.  MyChart is used to connect with patients for Virtual Visits (Telemedicine).  Patients are able to view lab/test results, encounter notes, upcoming appointments, etc.  Non-urgent messages can be sent to your provider as well.   To learn more about what you can do with MyChart, go to  [email or web address].    Your next appointment:   12 month(s)  Provider:   Little Ishikawa,  MD        Signed, Little Ishikawa, MD  09/07/2022 9:41 AM    [facility]

## 2022-09-05 DIAGNOSIS — H25013 Cortical age-related cataract, bilateral: Secondary | ICD-10-CM | POA: Diagnosis not present

## 2022-09-05 DIAGNOSIS — H524 Presbyopia: Secondary | ICD-10-CM | POA: Diagnosis not present

## 2022-09-05 DIAGNOSIS — H2513 Age-related nuclear cataract, bilateral: Secondary | ICD-10-CM | POA: Diagnosis not present

## 2022-09-07 ENCOUNTER — Ambulatory Visit: Payer: PPO | Attending: Cardiology | Admitting: Cardiology

## 2022-09-07 VITALS — BP 136/68 | HR 70 | Ht 73.0 in | Wt 211.2 lb

## 2022-09-07 DIAGNOSIS — I2510 Atherosclerotic heart disease of native coronary artery without angina pectoris: Secondary | ICD-10-CM | POA: Diagnosis not present

## 2022-09-07 DIAGNOSIS — E785 Hyperlipidemia, unspecified: Secondary | ICD-10-CM

## 2022-09-07 DIAGNOSIS — R7303 Prediabetes: Secondary | ICD-10-CM

## 2022-09-07 NOTE — Patient Instructions (Signed)
Medication Instructions:  Your physician recommends that you continue on your current medications as directed. Please refer to the Current Medication list given to you today.  *If you need a refill on your cardiac medications before your next appointment, please call your pharmacy*  Follow-Up: At  [facility], you and your health needs are our priority.  As part of our continuing mission to provide you with exceptional heart care, we have created designated Provider Care Teams.  These Care Teams include your primary Cardiologist (physician) and Advanced Practice Providers (APPs -  Physician Assistants and Nurse Practitioners) who all work together to provide you with the care you need, when you need it.  We recommend signing up for the patient portal called "MyChart".  Sign up information is provided on this After Visit Summary.  MyChart is used to connect with patients for Virtual Visits (Telemedicine).  Patients are able to view lab/test results, encounter notes, upcoming appointments, etc.  Non-urgent messages can be sent to your provider as well.   To learn more about what you can do with MyChart, go to [email or web address].    Your next appointment:   12 month(s)  Provider:   Christopher L Schumann, MD      

## 2022-09-10 DIAGNOSIS — R972 Elevated prostate specific antigen [PSA]: Secondary | ICD-10-CM | POA: Diagnosis not present

## 2022-09-12 DIAGNOSIS — H2513 Age-related nuclear cataract, bilateral: Secondary | ICD-10-CM | POA: Diagnosis not present

## 2022-09-12 DIAGNOSIS — H5203 Hypermetropia, bilateral: Secondary | ICD-10-CM | POA: Diagnosis not present

## 2022-09-12 DIAGNOSIS — H52222 Regular astigmatism, left eye: Secondary | ICD-10-CM | POA: Diagnosis not present

## 2022-09-12 DIAGNOSIS — H524 Presbyopia: Secondary | ICD-10-CM | POA: Diagnosis not present

## 2022-09-17 DIAGNOSIS — R3912 Poor urinary stream: Secondary | ICD-10-CM | POA: Diagnosis not present

## 2022-09-17 DIAGNOSIS — R972 Elevated prostate specific antigen [PSA]: Secondary | ICD-10-CM | POA: Diagnosis not present

## 2022-09-17 DIAGNOSIS — N401 Enlarged prostate with lower urinary tract symptoms: Secondary | ICD-10-CM | POA: Diagnosis not present

## 2022-09-18 ENCOUNTER — Other Ambulatory Visit: Payer: Self-pay | Admitting: Urology

## 2022-09-18 DIAGNOSIS — R972 Elevated prostate specific antigen [PSA]: Secondary | ICD-10-CM

## 2022-10-24 DIAGNOSIS — M5136 Other intervertebral disc degeneration, lumbar region: Secondary | ICD-10-CM | POA: Diagnosis not present

## 2022-10-24 DIAGNOSIS — M5440 Lumbago with sciatica, unspecified side: Secondary | ICD-10-CM | POA: Diagnosis not present

## 2022-10-24 DIAGNOSIS — M9903 Segmental and somatic dysfunction of lumbar region: Secondary | ICD-10-CM | POA: Diagnosis not present

## 2022-10-24 DIAGNOSIS — M9902 Segmental and somatic dysfunction of thoracic region: Secondary | ICD-10-CM | POA: Diagnosis not present

## 2022-10-29 DIAGNOSIS — M5136 Other intervertebral disc degeneration, lumbar region: Secondary | ICD-10-CM | POA: Diagnosis not present

## 2022-10-29 DIAGNOSIS — M5440 Lumbago with sciatica, unspecified side: Secondary | ICD-10-CM | POA: Diagnosis not present

## 2022-10-29 DIAGNOSIS — M9902 Segmental and somatic dysfunction of thoracic region: Secondary | ICD-10-CM | POA: Diagnosis not present

## 2022-10-29 DIAGNOSIS — M9903 Segmental and somatic dysfunction of lumbar region: Secondary | ICD-10-CM | POA: Diagnosis not present

## 2022-10-30 ENCOUNTER — Other Ambulatory Visit: Payer: Self-pay | Admitting: Internal Medicine

## 2022-10-31 ENCOUNTER — Ambulatory Visit
Admission: RE | Admit: 2022-10-31 | Discharge: 2022-10-31 | Disposition: A | Discharge status: Home / Self Care | Payer: PPO | Source: Ambulatory Visit | Attending: Urology | Admitting: Urology

## 2022-10-31 DIAGNOSIS — R972 Elevated prostate specific antigen [PSA]: Secondary | ICD-10-CM

## 2022-10-31 DIAGNOSIS — M9902 Segmental and somatic dysfunction of thoracic region: Secondary | ICD-10-CM | POA: Diagnosis not present

## 2022-10-31 DIAGNOSIS — M5136 Other intervertebral disc degeneration, lumbar region: Secondary | ICD-10-CM | POA: Diagnosis not present

## 2022-10-31 DIAGNOSIS — M9903 Segmental and somatic dysfunction of lumbar region: Secondary | ICD-10-CM | POA: Diagnosis not present

## 2022-10-31 DIAGNOSIS — M5440 Lumbago with sciatica, unspecified side: Secondary | ICD-10-CM | POA: Diagnosis not present

## 2022-10-31 MED ORDER — GADOPICLENOL 0.5 MMOL/ML IV SOLN
10.0000 mL | Freq: Once | INTRAVENOUS | Status: AC | PRN
  Administered 2022-10-31: 10 mL via INTRAVENOUS

## 2022-11-05 DIAGNOSIS — M5440 Lumbago with sciatica, unspecified side: Secondary | ICD-10-CM | POA: Diagnosis not present

## 2022-11-05 DIAGNOSIS — M9903 Segmental and somatic dysfunction of lumbar region: Secondary | ICD-10-CM | POA: Diagnosis not present

## 2022-11-05 DIAGNOSIS — M5136 Other intervertebral disc degeneration, lumbar region: Secondary | ICD-10-CM | POA: Diagnosis not present

## 2022-11-05 DIAGNOSIS — M9902 Segmental and somatic dysfunction of thoracic region: Secondary | ICD-10-CM | POA: Diagnosis not present

## 2022-11-07 DIAGNOSIS — M9903 Segmental and somatic dysfunction of lumbar region: Secondary | ICD-10-CM | POA: Diagnosis not present

## 2022-11-07 DIAGNOSIS — M9902 Segmental and somatic dysfunction of thoracic region: Secondary | ICD-10-CM | POA: Diagnosis not present

## 2022-11-07 DIAGNOSIS — M5136 Other intervertebral disc degeneration, lumbar region: Secondary | ICD-10-CM | POA: Diagnosis not present

## 2022-11-07 DIAGNOSIS — M5440 Lumbago with sciatica, unspecified side: Secondary | ICD-10-CM | POA: Diagnosis not present

## 2022-11-12 DIAGNOSIS — M5136 Other intervertebral disc degeneration, lumbar region: Secondary | ICD-10-CM | POA: Diagnosis not present

## 2022-11-12 DIAGNOSIS — M9903 Segmental and somatic dysfunction of lumbar region: Secondary | ICD-10-CM | POA: Diagnosis not present

## 2022-11-12 DIAGNOSIS — M5440 Lumbago with sciatica, unspecified side: Secondary | ICD-10-CM | POA: Diagnosis not present

## 2022-11-12 DIAGNOSIS — M9902 Segmental and somatic dysfunction of thoracic region: Secondary | ICD-10-CM | POA: Diagnosis not present

## 2022-11-13 DIAGNOSIS — H2511 Age-related nuclear cataract, right eye: Secondary | ICD-10-CM | POA: Diagnosis not present

## 2022-11-13 DIAGNOSIS — H25013 Cortical age-related cataract, bilateral: Secondary | ICD-10-CM | POA: Diagnosis not present

## 2022-11-13 DIAGNOSIS — H2513 Age-related nuclear cataract, bilateral: Secondary | ICD-10-CM | POA: Diagnosis not present

## 2022-11-13 DIAGNOSIS — H18413 Arcus senilis, bilateral: Secondary | ICD-10-CM | POA: Diagnosis not present

## 2022-11-13 DIAGNOSIS — H25043 Posterior subcapsular polar age-related cataract, bilateral: Secondary | ICD-10-CM | POA: Diagnosis not present

## 2022-11-14 DIAGNOSIS — M9902 Segmental and somatic dysfunction of thoracic region: Secondary | ICD-10-CM | POA: Diagnosis not present

## 2022-11-14 DIAGNOSIS — M9903 Segmental and somatic dysfunction of lumbar region: Secondary | ICD-10-CM | POA: Diagnosis not present

## 2022-11-14 DIAGNOSIS — M5136 Other intervertebral disc degeneration, lumbar region: Secondary | ICD-10-CM | POA: Diagnosis not present

## 2022-11-14 DIAGNOSIS — M5440 Lumbago with sciatica, unspecified side: Secondary | ICD-10-CM | POA: Diagnosis not present

## 2022-11-24 ENCOUNTER — Other Ambulatory Visit: Payer: Self-pay | Admitting: Internal Medicine

## 2022-11-26 ENCOUNTER — Other Ambulatory Visit: Payer: Self-pay

## 2022-11-26 DIAGNOSIS — M9902 Segmental and somatic dysfunction of thoracic region: Secondary | ICD-10-CM | POA: Diagnosis not present

## 2022-11-26 DIAGNOSIS — M5136 Other intervertebral disc degeneration, lumbar region: Secondary | ICD-10-CM | POA: Diagnosis not present

## 2022-11-26 DIAGNOSIS — M5440 Lumbago with sciatica, unspecified side: Secondary | ICD-10-CM | POA: Diagnosis not present

## 2022-11-26 DIAGNOSIS — M9903 Segmental and somatic dysfunction of lumbar region: Secondary | ICD-10-CM | POA: Diagnosis not present

## 2022-11-28 DIAGNOSIS — M9903 Segmental and somatic dysfunction of lumbar region: Secondary | ICD-10-CM | POA: Diagnosis not present

## 2022-11-28 DIAGNOSIS — M5136 Other intervertebral disc degeneration, lumbar region: Secondary | ICD-10-CM | POA: Diagnosis not present

## 2022-11-28 DIAGNOSIS — M9902 Segmental and somatic dysfunction of thoracic region: Secondary | ICD-10-CM | POA: Diagnosis not present

## 2022-11-28 DIAGNOSIS — M5440 Lumbago with sciatica, unspecified side: Secondary | ICD-10-CM | POA: Diagnosis not present

## 2022-12-03 DIAGNOSIS — M5440 Lumbago with sciatica, unspecified side: Secondary | ICD-10-CM | POA: Diagnosis not present

## 2022-12-03 DIAGNOSIS — M5136 Other intervertebral disc degeneration, lumbar region: Secondary | ICD-10-CM | POA: Diagnosis not present

## 2022-12-03 DIAGNOSIS — M9903 Segmental and somatic dysfunction of lumbar region: Secondary | ICD-10-CM | POA: Diagnosis not present

## 2022-12-03 DIAGNOSIS — M9902 Segmental and somatic dysfunction of thoracic region: Secondary | ICD-10-CM | POA: Diagnosis not present

## 2023-01-03 ENCOUNTER — Encounter (INDEPENDENT_AMBULATORY_CARE_PROVIDER_SITE_OTHER): Payer: Self-pay

## 2023-01-07 DIAGNOSIS — M9903 Segmental and somatic dysfunction of lumbar region: Secondary | ICD-10-CM | POA: Diagnosis not present

## 2023-01-07 DIAGNOSIS — M5440 Lumbago with sciatica, unspecified side: Secondary | ICD-10-CM | POA: Diagnosis not present

## 2023-01-07 DIAGNOSIS — M5136 Other intervertebral disc degeneration, lumbar region: Secondary | ICD-10-CM | POA: Diagnosis not present

## 2023-01-07 DIAGNOSIS — M9902 Segmental and somatic dysfunction of thoracic region: Secondary | ICD-10-CM | POA: Diagnosis not present

## 2023-01-09 DIAGNOSIS — M9903 Segmental and somatic dysfunction of lumbar region: Secondary | ICD-10-CM | POA: Diagnosis not present

## 2023-01-09 DIAGNOSIS — M5440 Lumbago with sciatica, unspecified side: Secondary | ICD-10-CM | POA: Diagnosis not present

## 2023-01-09 DIAGNOSIS — M5136 Other intervertebral disc degeneration, lumbar region: Secondary | ICD-10-CM | POA: Diagnosis not present

## 2023-01-09 DIAGNOSIS — M9902 Segmental and somatic dysfunction of thoracic region: Secondary | ICD-10-CM | POA: Diagnosis not present

## 2023-01-14 DIAGNOSIS — M5136 Other intervertebral disc degeneration, lumbar region: Secondary | ICD-10-CM | POA: Diagnosis not present

## 2023-01-14 DIAGNOSIS — M9902 Segmental and somatic dysfunction of thoracic region: Secondary | ICD-10-CM | POA: Diagnosis not present

## 2023-01-14 DIAGNOSIS — M9903 Segmental and somatic dysfunction of lumbar region: Secondary | ICD-10-CM | POA: Diagnosis not present

## 2023-01-14 DIAGNOSIS — M5440 Lumbago with sciatica, unspecified side: Secondary | ICD-10-CM | POA: Diagnosis not present

## 2023-01-20 ENCOUNTER — Other Ambulatory Visit: Payer: Self-pay | Admitting: Internal Medicine

## 2023-01-22 ENCOUNTER — Other Ambulatory Visit: Payer: Self-pay

## 2023-01-23 ENCOUNTER — Other Ambulatory Visit (INDEPENDENT_AMBULATORY_CARE_PROVIDER_SITE_OTHER): Payer: PPO

## 2023-01-23 DIAGNOSIS — M5136 Other intervertebral disc degeneration, lumbar region: Secondary | ICD-10-CM | POA: Diagnosis not present

## 2023-01-23 DIAGNOSIS — E1340 Other specified diabetes mellitus with diabetic neuropathy, unspecified: Secondary | ICD-10-CM

## 2023-01-23 DIAGNOSIS — M9903 Segmental and somatic dysfunction of lumbar region: Secondary | ICD-10-CM | POA: Diagnosis not present

## 2023-01-23 DIAGNOSIS — E559 Vitamin D deficiency, unspecified: Secondary | ICD-10-CM

## 2023-01-23 DIAGNOSIS — M5440 Lumbago with sciatica, unspecified side: Secondary | ICD-10-CM | POA: Diagnosis not present

## 2023-01-23 DIAGNOSIS — M9902 Segmental and somatic dysfunction of thoracic region: Secondary | ICD-10-CM | POA: Diagnosis not present

## 2023-01-23 LAB — LIPID PANEL
Cholesterol: 104 mg/dL (ref 0–200)
HDL: 39.3 mg/dL (ref >39.00)
LDL Cholesterol: 48 mg/dL (ref 0–99)
NonHDL: 65.04
Total CHOL/HDL Ratio: 3
Triglycerides: 83 mg/dL (ref 0.0–149.0)
VLDL: 16.6 mg/dL (ref 0.0–40.0)

## 2023-01-23 LAB — BASIC METABOLIC PANEL
BUN: 22 mg/dL (ref 6–23)
CO2: 24 meq/L (ref 19–32)
Calcium: 9.3 mg/dL (ref 8.4–10.5)
Chloride: 106 meq/L (ref 96–112)
Creatinine, Ser: 1.21 mg/dL (ref 0.40–1.50)
GFR: 60.31 mL/min (ref >60.00)
Glucose, Bld: 162 mg/dL — ABNORMAL HIGH (ref 70–99)
Potassium: 4.2 meq/L (ref 3.5–5.1)
Sodium: 138 meq/L (ref 135–145)

## 2023-01-23 LAB — HEMOGLOBIN A1C: Hgb A1c MFr Bld: 6.8 % — ABNORMAL HIGH (ref 4.6–6.5)

## 2023-01-23 LAB — HEPATIC FUNCTION PANEL
ALT: 18 U/L (ref 0–53)
AST: 15 U/L (ref 0–37)
Albumin: 4.3 g/dL (ref 3.5–5.2)
Alkaline Phosphatase: 75 U/L (ref 39–117)
Bilirubin, Direct: 0.3 mg/dL (ref 0.0–0.3)
Total Bilirubin: 1.2 mg/dL (ref 0.2–1.2)
Total Protein: 7.2 g/dL (ref 6.0–8.3)

## 2023-01-23 LAB — VITAMIN D 25 HYDROXY (VIT D DEFICIENCY, FRACTURES): VITD: 61.13 ng/mL (ref 30.00–100.00)

## 2023-01-25 DIAGNOSIS — H269 Unspecified cataract: Secondary | ICD-10-CM | POA: Insufficient documentation

## 2023-01-25 DIAGNOSIS — H2512 Age-related nuclear cataract, left eye: Secondary | ICD-10-CM | POA: Diagnosis not present

## 2023-01-25 DIAGNOSIS — H2511 Age-related nuclear cataract, right eye: Secondary | ICD-10-CM | POA: Diagnosis not present

## 2023-01-31 ENCOUNTER — Ambulatory Visit (INDEPENDENT_AMBULATORY_CARE_PROVIDER_SITE_OTHER): Payer: PPO | Admitting: Internal Medicine

## 2023-01-31 ENCOUNTER — Encounter: Payer: Self-pay | Admitting: Internal Medicine

## 2023-01-31 VITALS — BP 124/82 | HR 97 | Temp 98.4°F | Ht 73.0 in | Wt 214.0 lb

## 2023-01-31 DIAGNOSIS — J309 Allergic rhinitis, unspecified: Secondary | ICD-10-CM | POA: Diagnosis not present

## 2023-01-31 DIAGNOSIS — E538 Deficiency of other specified B group vitamins: Secondary | ICD-10-CM | POA: Diagnosis not present

## 2023-01-31 DIAGNOSIS — E1165 Type 2 diabetes mellitus with hyperglycemia: Secondary | ICD-10-CM

## 2023-01-31 DIAGNOSIS — E7800 Pure hypercholesterolemia, unspecified: Secondary | ICD-10-CM

## 2023-01-31 DIAGNOSIS — E1340 Other specified diabetes mellitus with diabetic neuropathy, unspecified: Secondary | ICD-10-CM

## 2023-01-31 DIAGNOSIS — E559 Vitamin D deficiency, unspecified: Secondary | ICD-10-CM | POA: Diagnosis not present

## 2023-01-31 DIAGNOSIS — Z125 Encounter for screening for malignant neoplasm of prostate: Secondary | ICD-10-CM

## 2023-01-31 NOTE — Patient Instructions (Signed)
Please take all new medication as prescribed   Please continue all other medications as before, and refills have been done if requested.  Please have the pharmacy call with any other refills you may need.  Please continue your efforts at being more active, low cholesterol diet, and weight control.  You are otherwise up to date with prevention measures today.  Please keep your appointments with your specialists as you may have planned  Please make an Appointment to return in 6 months, or sooner if needed, also with Lab Appointment for testing done 3-5 days before at the FIRST FLOOR Lab (so this is for TWO appointments - please see the scheduling desk as you leave) .

## 2023-01-31 NOTE — Progress Notes (Signed)
Patient ID: Paul Tucker, male   DOB: [birth year], 72 y.o.   MRN: [id number]        Chief Complaint: follow up HTN, HLD and DM with obesity, low b12 and vit d       HPI:  ICHAEL Tucker is a 72 y.o. male here overall doing ok,  Pt denies chest pain, increased sob or doe, wheezing, orthopnea, PND, increased LE swelling, palpitations, dizziness or syncope.   Pt denies polydipsia, polyuria, or new focal neuro s/s.    Pt denies fever, wt loss, night sweats, loss of appetite, or other constitutional symptoms  Does have several wks ongoing nasal allergy symptoms with clearish congestion, itch and sneezing, without fever, pain, ST, cough, swelling or wheezing.  Wt Readings from Last 3 Encounters:  01/31/23 214 lb (97.1 kg)  09/07/22 211 lb 3.2 oz (95.8 kg)  07/31/22 208 lb (94.3 kg)   BP Readings from Last 3 Encounters:  01/31/23 124/82  09/07/22 136/68  07/31/22 126/78         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   LOW BACK PAIN 01/20/2007   OBSTRUCTIVE SLEEP APNEA 04/04/2007   OTITIS MEDIA, ACUTE, BILATERAL 06/13/2009   PANCREATITIS, HX OF 01/17/2007   PSA, INCREASED 11/18/2009   RASH-NONVESICULAR 08/26/2009   SINUSITIS- ACUTE-NOS 04/04/2007   SWEATING 09/30/2007   THRUSH 04/04/2007   TONSILLECTOMY AND ADENOIDECTOMY, HX OF 01/17/2007   URI 07/25/2010   URINARY RETENTION 01/27/2009   VERTIGO 06/13/2009   Past Surgical History:  Procedure Laterality Date    CHOLECYSTECTOMY  2005   HEMORRHOID BANDING  2015   HERNIA REPAIR  1957   INGUINAL HERNIA REPAIR Right 07/27/2020   Procedure: RIGHT INGUINAL HERNIA REPAIR WITH MESH;  Surgeon: Harriette Bouillon, MD;  Location: [facility] OR;  Service: General;  Laterality: Right;   TONSILLECTOMY     VASECTOMY  1987    reports that he has never smoked. He has never used smokeless tobacco. He reports that he does not drink alcohol and does not use drugs. family history includes Coronary artery disease in an other family member; Diabetes in an other family member; Emphysema in his father; Heart disease in his father. Allergies  Allergen Reactions   Atrovent [Ipratropium] Swelling    Throat swelling   Pravastatin Sodium Nausea Only    Pt does not recall this reaction   Current Outpatient Medications on File Prior to Visit  Medication Sig Dispense Refill   amantadine (SYMMETREL) 100 MG capsule Take 100 mg by mouth 2 (two) times daily.     aspirin 81 MG EC tablet Take 81 mg by mouth daily.     celecoxib (CELEBREX) 200 MG capsule TAKE 1 CAPSULE BY MOUTH TWICE A DAY AS NEEDED 180 capsule 1   Cholecalciferol (VITAMIN D3 PO) Take 4,000 Units by mouth.     Ciclopirox 0.77 % gel USE AS DIRECTED TOPCIALLY 45 g 1   cyclobenzaprine (FLEXERIL) 5 MG tablet Take  1 tablet (5 mg total) by mouth 3 (three) times daily as needed for muscle spasms. 40 tablet 2   diazepam (VALIUM) 10 MG tablet Take 10 mg by mouth daily as needed.     diclofenac Sodium (VOLTAREN) 1 % GEL Apply 4 g topically in the morning and at bedtime.     diphenhydrAMINE (BENADRYL) 25 MG tablet Take 25 mg by mouth every 6 (six) hours as needed for allergies.     fluocinonide cream (LIDEX) 0.05 % Apply 1 Application topically 2 (two) times daily. 30 g 1   gatifloxacin (ZYMAXID) 0.5 % SOLN Place 1 drop into the left eye 4 (four) times daily.     ketorolac (ACULAR) 0.5 % ophthalmic solution SMARTSIG:In Eye(s)     LORazepam (ATIVAN) 0.5 MG tablet Take 0.5 mg by mouth 2  (two) times daily as needed.     lovastatin (MEVACOR) 40 MG tablet TAKE TWO TABLETS BY MOUTH AT BEDTIME 180 tablet 3   meclizine (ANTIVERT) 25 MG tablet TAKE ONE TABLET BY MOUTH THREE TIMES A DAY AS NEEDED FOR DIZZINESS 30 tablet 2   meloxicam (MOBIC) 15 MG tablet      mometasone (ELOCON) 0.1 % cream APPLY TO AFFECTED AREA(S) DAILY 45 g 0   OLANZapine (ZYPREXA) 2.5 MG tablet Take 2.5 mg by mouth at bedtime.     ondansetron (ZOFRAN-ODT) 4 MG disintegrating tablet Take 1 tablet (4 mg total) by mouth every 8 (eight) hours as needed for nausea or vomiting. 20 tablet 0   prednisoLONE acetate (PRED FORTE) 1 % ophthalmic suspension Place 1 drop into the left eye 4 (four) times daily.     tamsulosin (FLOMAX) 0.4 MG CAPS capsule TAKE ONE CAPSULE BY MOUTH DAILY 90 capsule 3   TRINTELLIX 20 MG TABS tablet Take 20 mg by mouth daily.     zolpidem (AMBIEN) 10 MG tablet TAKE ONE TABLET BY MOUTH EVERY NIGHT AT BEDTIME 90 tablet 1   doxycycline (VIBRA-TABS) 100 MG tablet Take 1 tablet (100 mg total) by mouth 2 (two) times daily. (Patient not taking: Reported on 01/31/2023) 42 tablet 0   No current facility-administered medications on file prior to visit.        ROS:  All others reviewed and negative.  Objective        PE:  BP 124/82 (BP Location: Left Arm, Patient Position: Sitting, Cuff Size: Normal)   Pulse 97   Temp 98.4 F (36.9 C) (Oral)   Ht 6\' 1"  (1.854 m)   Wt 214 lb (97.1 kg)   SpO2 96%   BMI 28.23 kg/m                 Constitutional: Pt appears in NAD               HENT: Head: NCAT.                Right Ear: External ear normal.                 Left Ear: External ear normal.                Eyes: . Pupils are equal, round, and reactive to light. Conjunctivae and EOM are normal               Nose: without d/c or deformity               Neck: Neck supple. Gross normal ROM  Cardiovascular: Normal rate and regular rhythm.                 Pulmonary/Chest: Effort normal and breath  sounds without rales or wheezing.                Abd:  Soft, NT, ND, + BS, no organomegaly               Neurological: Pt is alert. At baseline orientation, motor grossly intact               Skin: Skin is warm. No rashes, no other new lesions, LE edema - none               Psychiatric: Pt behavior is normal without agitation   Micro: none  Cardiac tracings I have personally interpreted today:  none  Pertinent Radiological findings (summarize): none   Lab Results  Component Value Date   WBC 5.5 07/27/2022   HGB 13.7 07/27/2022   HCT 38.8 (L) 07/27/2022   PLT 252.0 07/27/2022   GLUCOSE 162 (H) 01/23/2023   CHOL 104 01/23/2023   TRIG 83.0 01/23/2023   HDL 39.30 01/23/2023   LDLDIRECT 82.0 02/28/2017   LDLCALC 48 01/23/2023   ALT 18 01/23/2023   AST 15 01/23/2023   NA 138 01/23/2023   K 4.2 01/23/2023   CL 106 01/23/2023   CREATININE 1.21 01/23/2023   BUN 22 01/23/2023   CO2 24 01/23/2023   TSH 1.55 07/27/2022   PSA 6.01 (H) 07/27/2022   HGBA1C 6.8 (H) 01/23/2023   MICROALBUR 1.8 07/27/2022   Assessment/Plan:  ELI WISSE is a 72 y.o. White or Caucasian [1] male with  has a past medical history of Acute prostatitis (04/04/2007), ADD (01/17/2007), Anxiety, DEPRESSION (01/17/2007), Dermatophytosis of groin and perianal area (05/31/2008), DIVERTICULOSIS, COLON (04/06/2007), Dysuria (01/27/2009), FLANK PAIN, LEFT (09/30/2007), Flushing (08/26/2009), GLUCOSE INTOLERANCE (04/04/2007), Gross hematuria (01/27/2009), HEMATOCHEZIA (07/26/2008), HYPERLIPIDEMIA (01/20/2007), HYPOGONADISM (06/13/2009), Impaired glucose tolerance (12/10/2010), INSOMNIA (04/04/2007), LIBIDO, DECREASED (12/02/2008), LICHEN SIMPLEX CHRONICUS (05/31/2008), LOW BACK PAIN (01/20/2007), OBSTRUCTIVE SLEEP APNEA (04/04/2007), OTITIS MEDIA, ACUTE, BILATERAL (06/13/2009), PANCREATITIS, HX OF (01/17/2007), PSA, INCREASED (11/18/2009), RASH-NONVESICULAR (08/26/2009), SINUSITIS- ACUTE-NOS (04/04/2007), SWEATING (09/30/2007), THRUSH (04/04/2007),  TONSILLECTOMY AND ADENOIDECTOMY, HX OF (01/17/2007), URI (07/25/2010), URINARY RETENTION (01/27/2009), and VERTIGO (06/13/2009).  B12 deficiency Lab Results  Component Value Date   VITAMINB12 354 07/27/2022   Stable, cont oral replacement - b12 1000 mcg qd   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HCC) Lab Results  Component Value Date   HGBA1C 6.8 (H) 01/23/2023   Mild elevated, pt declines mounjaro start for now, but will call if changes mind , cont dm diet   Hyperlipidemia Lab Results  Component Value Date   LDLCALC 48 01/23/2023   Stable, pt to continue current statin lovastatin 80 qhs   Vitamin D deficiency Last vitamin D Lab Results  Component Value Date   VD25OH 61.13 01/23/2023   Stable, cont oral replacement   Allergic rhinitis Mild to mod, for asdd otc nasacort asd,  to f/u any worsening symptoms or concerns  Followup: Return in about 6 months (around 07/31/2023).  Oliver Barre, MD 02/03/2023 1:55 PM [facility]

## 2023-02-03 ENCOUNTER — Encounter: Payer: Self-pay | Admitting: Internal Medicine

## 2023-02-03 NOTE — Assessment & Plan Note (Signed)
Lab Results  Component Value Date   VITAMINB12 354 07/27/2022   Stable, cont oral replacement - b12 1000 mcg qd

## 2023-02-03 NOTE — Assessment & Plan Note (Signed)
Lab Results  Component Value Date   LDLCALC 48 01/23/2023   Stable, pt to continue current statin lovastatin 80 qhs

## 2023-02-03 NOTE — Assessment & Plan Note (Signed)
Mild to mod, for asdd otc nasacort asd,  to f/u any worsening symptoms or concerns

## 2023-02-03 NOTE — Assessment & Plan Note (Signed)
Lab Results  Component Value Date   HGBA1C 6.8 (H) 01/23/2023   Mild elevated, pt declines mounjaro start for now, but will call if changes mind , cont dm diet

## 2023-02-03 NOTE — Assessment & Plan Note (Signed)
Last vitamin D Lab Results  Component Value Date   VD25OH 61.13 01/23/2023   Stable, cont oral replacement

## 2023-02-04 MED ORDER — LOVASTATIN 40 MG PO TABS: 40.0000 mg | ORAL_TABLET | Freq: Every day | ORAL | Status: DC

## 2023-02-08 DIAGNOSIS — H25012 Cortical age-related cataract, left eye: Secondary | ICD-10-CM | POA: Diagnosis not present

## 2023-02-08 DIAGNOSIS — H2512 Age-related nuclear cataract, left eye: Secondary | ICD-10-CM | POA: Diagnosis not present

## 2023-02-08 DIAGNOSIS — H25042 Posterior subcapsular polar age-related cataract, left eye: Secondary | ICD-10-CM | POA: Diagnosis not present

## 2023-02-12 ENCOUNTER — Other Ambulatory Visit: Payer: Self-pay | Admitting: Pharmacist

## 2023-02-12 ENCOUNTER — Encounter: Payer: Self-pay | Admitting: Pharmacist

## 2023-02-12 DIAGNOSIS — E7800 Pure hypercholesterolemia, unspecified: Secondary | ICD-10-CM

## 2023-02-12 MED ORDER — LOVASTATIN 40 MG PO TABS: 40.0000 mg | ORAL_TABLET | Freq: Every day | ORAL | Status: DC

## 2023-02-12 NOTE — Telephone Encounter (Signed)
Updated lovastatin Rx sent to pharmacy for correct quantity to be dispensed as patient is appearing late for refill on quality metric gap report due to previous Rx written to take 2 tablets daily.  Arbutus Leas, PharmD, BCPS [facility] [phone]

## 2023-02-12 NOTE — Progress Notes (Signed)
Pharmacy Quality Measure Review  This patient is appearing on a report for being at risk of failing the adherence measure for cholesterol (statin) medications this calendar year.   Medication: lovastatin 40 mg daily Last fill date: 11/27/2022 for 90 day supply  Patient was prescribed to take 2 tablets daily but has been taking 1 tablet daily. Per patient message 02/03/23, med list has been updated but updated Rx was not sent. Refills will continue appearing late until updated Rx is filled with correct quantity.  Will send refill for updated Rx.  Arbutus Leas, PharmD, BCPS [facility] [phone]

## 2023-02-22 DIAGNOSIS — R972 Elevated prostate specific antigen [PSA]: Secondary | ICD-10-CM | POA: Diagnosis not present

## 2023-02-25 ENCOUNTER — Ambulatory Visit (INDEPENDENT_AMBULATORY_CARE_PROVIDER_SITE_OTHER): Payer: PPO

## 2023-02-25 DIAGNOSIS — Z23 Encounter for immunization: Secondary | ICD-10-CM

## 2023-02-26 ENCOUNTER — Ambulatory Visit: Payer: PPO

## 2023-03-01 DIAGNOSIS — R3912 Poor urinary stream: Secondary | ICD-10-CM | POA: Diagnosis not present

## 2023-03-01 DIAGNOSIS — R972 Elevated prostate specific antigen [PSA]: Secondary | ICD-10-CM | POA: Diagnosis not present

## 2023-03-01 DIAGNOSIS — N401 Enlarged prostate with lower urinary tract symptoms: Secondary | ICD-10-CM | POA: Diagnosis not present

## 2023-03-19 DIAGNOSIS — L570 Actinic keratosis: Secondary | ICD-10-CM | POA: Diagnosis not present

## 2023-03-19 DIAGNOSIS — I781 Nevus, non-neoplastic: Secondary | ICD-10-CM | POA: Diagnosis not present

## 2023-04-27 ENCOUNTER — Other Ambulatory Visit: Payer: Self-pay | Admitting: Internal Medicine

## 2023-05-03 ENCOUNTER — Ambulatory Visit: Payer: PPO

## 2023-05-08 ENCOUNTER — Ambulatory Visit: Payer: PPO

## 2023-05-08 VITALS — HR 65 | Temp 98.4°F | Ht 73.0 in | Wt 221.6 lb

## 2023-05-08 DIAGNOSIS — Z0000 Encounter for general adult medical examination without abnormal findings: Secondary | ICD-10-CM

## 2023-05-08 NOTE — Progress Notes (Signed)
Subjective:   URIJAH BITTING is a 72 y.o. male who presents for Medicare Annual/Subsequent preventive examination.  Visit Complete: In person  Patient Medicare AWV questionnaire was completed by the patient on 05/04/2023; I have confirmed that all information answered by patient is correct and no changes since this date.  Cardiac Risk Factors include: advanced age (>1men, >21 women);family history of premature cardiovascular disease;dyslipidemia;male gender;sedentary lifestyle;diabetes mellitus     Objective:    Today's Vitals   05/08/23 1458  Pulse: 65  Temp: 98.4 F (36.9 C)  TempSrc: Temporal  Weight: 221 lb 9.6 oz (100.5 kg)  Height: 6\' 1"  (1.854 m)  PainSc: 0-No pain   Body mass index is 29.24 kg/m.     05/08/2023    3:40 PM 04/30/2022   10:47 AM 05/24/2021   11:06 AM 04/28/2021   11:29 AM 07/27/2020    6:36 AM 03/14/2020    2:21 PM  Advanced Directives  Does Patient Have a Medical Advance Directive? Yes Yes Yes Yes No Yes  Type of Estate agent of [place];Living will Healthcare Power of [place];Living will Healthcare Power of [place];Living will Living will;Healthcare Power of [employer] Power of [place];Living will  Does patient want to make changes to medical advance directive?    No - Patient declined  No - Patient declined  Copy of Healthcare Power of Attorney in Chart? No - copy requested No - copy requested  No - copy requested  No - copy requested  Would patient like information on creating a medical advance directive?     No - Patient declined     Current Medications (verified) Outpatient Encounter Medications as of 05/08/2023  Medication Sig   amantadine (SYMMETREL) 100 MG capsule Take 100 mg by mouth 2 (two) times daily.   aspirin 81 MG EC tablet Take 81 mg by mouth daily.   celecoxib (CELEBREX) 200 MG capsule TAKE 1 CAPSULE BY MOUTH TWICE A DAY AS NEEDED   Cholecalciferol (VITAMIN D3 PO) Take 4,000 Units by mouth.    Ciclopirox 0.77 % gel USE AS DIRECTED TOPCIALLY   cyclobenzaprine (FLEXERIL) 5 MG tablet Take 1 tablet (5 mg total) by mouth 3 (three) times daily as needed for muscle spasms.   diazepam (VALIUM) 10 MG tablet Take 10 mg by mouth daily as needed.   diclofenac Sodium (VOLTAREN) 1 % GEL Apply 4 g topically in the morning and at bedtime.   diphenhydrAMINE (BENADRYL) 25 MG tablet Take 25 mg by mouth every 6 (six) hours as needed for allergies.   doxycycline (VIBRA-TABS) 100 MG tablet Take 1 tablet (100 mg total) by mouth 2 (two) times daily.   fluocinonide cream (LIDEX) 0.05 % Apply 1 Application topically 2 (two) times daily.   LORazepam (ATIVAN) 0.5 MG tablet Take 0.5 mg by mouth 2 (two) times daily as needed.   lovastatin (MEVACOR) 40 MG tablet Take 1 tablet (40 mg total) by mouth at bedtime.   meclizine (ANTIVERT) 25 MG tablet TAKE ONE TABLET BY MOUTH THREE TIMES A DAY AS NEEDED FOR DIZZINESS   mometasone (ELOCON) 0.1 % cream APPLY TO AFFECTED AREA(S) DAILY   OLANZapine (ZYPREXA) 2.5 MG tablet Take 2.5 mg by mouth at bedtime.   ondansetron (ZOFRAN-ODT) 4 MG disintegrating tablet Take 1 tablet (4 mg total) by mouth every 8 (eight) hours as needed for nausea or vomiting.   prednisoLONE acetate (PRED FORTE) 1 % ophthalmic suspension Place 1 drop into the left eye 4 (four) times daily.  tamsulosin (FLOMAX) 0.4 MG CAPS capsule TAKE ONE CAPSULE BY MOUTH DAILY   TRINTELLIX 20 MG TABS tablet Take 20 mg by mouth daily.   zolpidem (AMBIEN) 10 MG tablet TAKE 1 TABLET BY MOUTH EVERY NIGHT AT BEDTIME   [DISCONTINUED] gatifloxacin (ZYMAXID) 0.5 % SOLN Place 1 drop into the left eye 4 (four) times daily.   [DISCONTINUED] ketorolac (ACULAR) 0.5 % ophthalmic solution SMARTSIG:In Eye(s)   [DISCONTINUED] meloxicam (MOBIC) 15 MG tablet    No facility-administered encounter medications on file as of 05/08/2023.    Allergies (verified) Atrovent [ipratropium] and Pravastatin sodium   History: Past Medical  History:  Diagnosis Date   Acute prostatitis 04/04/2007   ADD 01/17/2007   Anxiety    DEPRESSION 01/17/2007   Treatment Resident Depression   Dermatophytosis of groin and perianal area 05/31/2008   DIVERTICULOSIS, COLON 04/06/2007   Dysuria 01/27/2009   FLANK PAIN, LEFT 09/30/2007   Flushing 08/26/2009   GLUCOSE INTOLERANCE 04/04/2007   Gross hematuria 01/27/2009   HEMATOCHEZIA 07/26/2008   HYPERLIPIDEMIA 01/20/2007   HYPOGONADISM 06/13/2009   Impaired glucose tolerance 12/10/2010   INSOMNIA 04/04/2007   LIBIDO, DECREASED 12/02/2008   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   LOW BACK PAIN 01/20/2007   OBSTRUCTIVE SLEEP APNEA 04/04/2007   OTITIS MEDIA, ACUTE, BILATERAL 06/13/2009   PANCREATITIS, HX OF 01/17/2007   PSA, INCREASED 11/18/2009   RASH-NONVESICULAR 08/26/2009   SINUSITIS- ACUTE-NOS 04/04/2007   SWEATING 09/30/2007   THRUSH 04/04/2007   TONSILLECTOMY AND ADENOIDECTOMY, HX OF 01/17/2007   URI 07/25/2010   URINARY RETENTION 01/27/2009   VERTIGO 06/13/2009   Past Surgical History:  Procedure Laterality Date   CHOLECYSTECTOMY  2005   HEMORRHOID BANDING  2015   HERNIA REPAIR  1957   INGUINAL HERNIA REPAIR Right 07/27/2020   Procedure: RIGHT INGUINAL HERNIA REPAIR WITH MESH;  Surgeon: Harriette Bouillon, MD;  Location: [facility] OR;  Service: General;  Laterality: Right;   TONSILLECTOMY     VASECTOMY  1987   Family History  Problem Relation Age of Onset   Emphysema Father    Heart disease Father    Coronary artery disease Other        male, 1st degree relative   Diabetes Other        1st degree relative   Colon cancer Neg Hx    Esophageal cancer Neg Hx    Rectal cancer Neg Hx    Stomach cancer Neg Hx    Social History   Socioeconomic History   Marital status: Married    Spouse name: Not on file   Number of children: 3   Years of education: Not on file   Highest education level: Not on file  Occupational History   Occupation: Estate agent  Tobacco Use   Smoking status: Never    Smokeless tobacco: Never  Vaping Use   Vaping status: Never Used  Substance and Sexual Activity   Alcohol use: Never    Alcohol/week: 0.0 standard drinks of alcohol   Drug use: No   Sexual activity: Not on file  Other Topics Concern   Not on file  Social History Narrative   Not on file   Social Drivers of Health   Financial Resource Strain: Low Risk  (05/08/2023)   Overall Financial Resource Strain (CARDIA)    Difficulty of Paying Living Expenses: Not hard at all  Food Insecurity: No Food Insecurity (05/08/2023)   Hunger Vital Sign    Worried About Running Out of Food in the  Last Year: Never true    Ran Out of Food in the Last Year: Never true  Transportation Needs: No Transportation Needs (05/08/2023)   PRAPARE - Administrator, Civil Service (Medical): No    Lack of Transportation (Non-Medical): No  Physical Activity: Inactive (05/08/2023)   Exercise Vital Sign    Days of Exercise per Week: 0 days    Minutes of Exercise per Session: 0 min  Stress: No Stress Concern Present (05/08/2023)   [employer] of Occupational Health - Occupational Stress Questionnaire    Feeling of Stress : Not at all  Social Connections: Moderately Isolated (05/08/2023)   Social Connection and Isolation Panel [NHANES]    Frequency of Communication with Friends and Family: Twice a week    Frequency of Social Gatherings with Friends and Family: Never    Attends Religious Services: More than 4 times per year    Active Member of [employer] or Organizations: No    Attends Engineer, structural: Never    Marital Status: Married    Tobacco Counseling Counseling given: Not Answered   Clinical Intake:  Pre-visit preparation completed: Yes  Pain : No/denies pain Pain Score: 0-No pain     BMI - recorded: 29.24 Nutritional Status: BMI 25 -29 Overweight Diabetes: Yes CBG done?: No Did pt. bring in CBG monitor from home?: No  How often do you need to have someone help you  when you read instructions, pamphlets, or other written materials from your doctor or pharmacy?: 2 - Rarely What is the last grade level you completed in school?: SOME COLLEGE  Interpreter Needed?: No  Information entered by :: Brand Males Monik Lins, LPN.   Activities of Daily Living    05/08/2023    3:01 PM 05/04/2023    9:11 AM  In your present state of health, do you have any difficulty performing the following activities:  Hearing? 0 0  Vision? 0 0  Difficulty concentrating or making decisions? 0 0  Walking or climbing stairs? 0 0  Dressing or bathing? 0 0  Doing errands, shopping? 0 0  Preparing Food and eating ? N N  Using the Toilet? N N  In the past six months, have you accidently leaked urine? N N  Do you have problems with loss of bowel control? N N  Managing your Medications? N N  Managing your Finances? N N  Housekeeping or managing your Housekeeping? N N    Patient Care Team: Corwin Levins, MD as PCP - General Little Ishikawa, MD as PCP - Cardiology (Cardiology) Blima Ledger, OD as Consulting Physician (Optometry) Milagros Evener, MD as Consulting Physician (Psychiatry) Mia Creek, MD as Consulting Physician (Ophthalmology)  Indicate any recent Medical Services you may have received from other than [facility] providers in the past year (date may be approximate).     Assessment:   This is a routine wellness examination for [place].  Hearing/Vision screen Hearing Screening - Comments:: Denies hearing difficulties.   Vision Screening - Comments:: Wears rx glasses - up to date with routine eye exams with Blima Ledger, OD.    Goals Addressed             This Visit's Progress    Client understands the importance of follow-up with providers by attending scheduled visits.        Depression Screen    05/08/2023    3:01 PM 01/31/2023    8:19 AM 07/31/2022    2:31 PM 07/09/2022  8:47 AM 04/30/2022   10:06 AM 01/31/2022    8:04 AM 07/28/2021    8:04  AM  PHQ 2/9 Scores  PHQ - 2 Score 0 2 2 1 3 3 2   PHQ- 9 Score 0 5 6  5 5 3     Fall Risk    05/08/2023    3:01 PM 05/04/2023    9:11 AM 01/31/2023    8:19 AM 07/31/2022    2:32 PM 07/09/2022    8:47 AM  Fall Risk   Falls in the past year? 0 0 0 0 0  Number falls in past yr: 0 0 0 0 0  Injury with Fall? 0 0 0 0 0  Risk for fall due to : No Fall Risks  No Fall Risks No Fall Risks No Fall Risks  Follow up Falls prevention discussed  Falls evaluation completed Falls evaluation completed;Education provided Falls evaluation completed    MEDICARE RISK AT HOME: Medicare Risk at Home Any stairs in or around the home?: Yes If so, are there any without handrails?: No Home free of loose throw rugs in walkways, pet beds, electrical cords, etc?: Yes Adequate lighting in your home to reduce risk of falls?: Yes Life alert?: No Use of a cane, walker or w/c?: No Grab bars in the bathroom?: Yes Shower chair or bench in shower?: Yes Elevated toilet seat or a handicapped toilet?: Yes  TIMED UP AND GO:  Was the test performed?  Yes  Length of time to ambulate 10 feet: 10 sec Gait steady and fast without use of assistive device    Cognitive Function:    05/08/2023    3:42 PM  MMSE - Mini Mental State Exam  Orientation to time 5  Orientation to Place 5  Registration 3  Attention/ Calculation 5  Recall 3  Language- name 2 objects 2  Language- repeat 1  Language- follow 3 step command 3  Language- read & follow direction 1  Write a sentence 1  Copy design 1  Total score 30        05/08/2023    3:02 PM 04/30/2022   10:09 AM  6CIT Screen  What Year? 0 points 0 points  What month? 0 points 0 points  What time? 0 points 0 points  Count back from 20 0 points 0 points  Months in reverse 0 points 0 points  Repeat phrase 0 points 0 points  Total Score 0 points 0 points    Immunizations Immunization History  Administered Date(s) Administered   Fluad Quad(high Dose 65+) 02/28/2019,  03/09/2020, 03/03/2021, 02/26/2022   Fluad Trivalent(High Dose 65+) 02/25/2023   Influenza Whole 02/28/2006, 04/04/2007   Influenza, High Dose Seasonal PF 03/06/2017, 02/07/2018   Influenza,inj,Quad PF,6+ Mos 02/25/2014, 03/10/2015, 03/13/2016   Influenza-Unspecified 02/19/2012   PFIZER(Purple Top)SARS-COV-2 Vaccination 06/25/2019, 07/20/2019, 02/15/2020   PNEUMOCOCCAL CONJUGATE-20 07/31/2022   Pfizer Covid-19 Vaccine Bivalent Booster 69yrs & up 02/09/2021   Pneumococcal Conjugate-13 03/06/2017   Pneumococcal Polysaccharide-23 02/25/2014, 07/20/2020   Td 12/02/2008   Zoster Recombinant(Shingrix) 07/24/2018, 11/26/2018   Zoster, Live 12/04/2011    TDAP status: Due, Education has been provided regarding the importance of this vaccine. Advised may receive this vaccine at local pharmacy or Health Dept. Aware to provide a copy of the vaccination record if obtained from local pharmacy or Health Dept. Verbalized acceptance and understanding.  Flu Vaccine status: Up to date  Pneumococcal vaccine status: Up to date  Covid-19 vaccine status: Completed vaccines  Qualifies for Shingles  Vaccine? Yes   Zostavax completed Yes   Shingrix Completed?: Yes  Screening Tests Health Maintenance  Topic Date Due   DTaP/Tdap/Td (2 - Tdap) 12/03/2018   COVID-19 Vaccine (5 - 2024-25 season) 01/20/2023   HEMOGLOBIN A1C  07/23/2023   Diabetic kidney evaluation - Urine ACR  07/27/2023   FOOT EXAM  07/31/2023   OPHTHALMOLOGY EXAM  12/03/2023   Diabetic kidney evaluation - eGFR measurement  01/23/2024   Medicare Annual Wellness (AWV)  05/07/2024   Colonoscopy  07/05/2024   Pneumonia Vaccine 72+ Years old  Completed   INFLUENZA VACCINE  Completed   Hepatitis C Screening  Completed   Zoster Vaccines- Shingrix  Completed   HPV VACCINES  Aged Out    Health Maintenance  Health Maintenance Due  Topic Date Due   DTaP/Tdap/Td (2 - Tdap) 12/03/2018   COVID-19 Vaccine (5 - 2024-25 season) 01/20/2023     Colorectal cancer screening: Type of screening: Colonoscopy. Completed 07/05/2014. Repeat every 10 years  Lung Cancer Screening: (Low Dose CT Chest recommended if Age 71-80 years, 20 pack-year currently smoking OR have quit w/in 15years.) does not qualify.   Lung Cancer Screening Referral: no  Additional Screening:  Hepatitis C Screening: does qualify; Completed 09/08/2015  Vision Screening: Recommended annual ophthalmology exams for early detection of glaucoma and other disorders of the eye. Is the patient up to date with their annual eye exam?  Yes  Who is the provider or what is the name of the office in which the patient attends annual eye exams? Blima Ledger, OD and Mia Creek, MD. If pt is not established with a provider, would they like to be referred to a provider to establish care? No .   Dental Screening: Recommended annual dental exams for proper oral hygiene  Diabetic Foot Exam: Diabetic Foot Exam: Completed 07/31/2022  Community Resource Referral / Chronic Care Management: CRR required this visit?  No   CCM required this visit?  No     Plan:     I have personally reviewed and noted the following in the patient's chart:   Medical and social history Use of alcohol, tobacco or illicit drugs  Current medications and supplements including opioid prescriptions. Patient is not currently taking opioid prescriptions. Functional ability and status Nutritional status Physical activity Advanced directives List of other physicians Hospitalizations, surgeries, and ER visits in previous 12 months Vitals Screenings to include cognitive, depression, and falls Referrals and appointments  In addition, I have reviewed and discussed with patient certain preventive protocols, quality metrics, and best practice recommendations. A written personalized care plan for preventive services as well as general preventive health recommendations were provided to patient.     Mickeal Needy, LPN   [id number]   After Visit Summary: MyChart per patient.  Nurse Notes: Not at this time.

## 2023-05-08 NOTE — Patient Instructions (Signed)
Mr. Smelser , Thank you for taking time to come for your Medicare Wellness Visit. I appreciate your ongoing commitment to your health goals. Please review the following plan we discussed and let me know if I can assist you in the future.   Referrals/Orders/Follow-Ups/Clinician Recommendations: Yes; Client understands the importance of follow-up appointments with providers by attending scheduled visits and discussed goals to eat healthier, increase physical activity 5 times a week for 30 minutes each, exercise the brain by doing stimulating brain exercises (reading, adult coloring, crafting, listening to music, puzzles, etc.), socialize and enjoy life more, get enough sleep at least 8-9 hours average per night and make time for laughter.  This is a list of the screening recommended for you and due dates:  Health Maintenance  Topic Date Due   DTaP/Tdap/Td vaccine (2 - Tdap) 12/03/2018   COVID-19 Vaccine (5 - 2024-25 season) 01/20/2023   Hemoglobin A1C  07/23/2023   Yearly kidney health urinalysis for diabetes  07/27/2023   Complete foot exam   07/31/2023   Eye exam for diabetics  12/03/2023   Yearly kidney function blood test for diabetes  01/23/2024   Medicare Annual Wellness Visit  05/07/2024   Colon Cancer Screening  07/05/2024   Pneumonia Vaccine  Completed   Flu Shot  Completed   Hepatitis C Screening  Completed   Zoster (Shingles) Vaccine  Completed   HPV Vaccine  Aged Out    Advanced directives: (Copy Requested) Please bring a copy of your health care power of attorney and living will to the office to be added to your chart at your convenience.  Next Medicare Annual Wellness Visit scheduled for next year: No

## 2023-06-05 DIAGNOSIS — H18413 Arcus senilis, bilateral: Secondary | ICD-10-CM | POA: Diagnosis not present

## 2023-06-05 DIAGNOSIS — H43391 Other vitreous opacities, right eye: Secondary | ICD-10-CM | POA: Diagnosis not present

## 2023-06-05 DIAGNOSIS — Z961 Presence of intraocular lens: Secondary | ICD-10-CM | POA: Diagnosis not present

## 2023-06-05 DIAGNOSIS — H26491 Other secondary cataract, right eye: Secondary | ICD-10-CM | POA: Diagnosis not present

## 2023-06-12 DIAGNOSIS — H43393 Other vitreous opacities, bilateral: Secondary | ICD-10-CM | POA: Diagnosis not present

## 2023-07-01 DIAGNOSIS — M9901 Segmental and somatic dysfunction of cervical region: Secondary | ICD-10-CM | POA: Diagnosis not present

## 2023-07-01 DIAGNOSIS — M9904 Segmental and somatic dysfunction of sacral region: Secondary | ICD-10-CM | POA: Diagnosis not present

## 2023-07-01 DIAGNOSIS — M9902 Segmental and somatic dysfunction of thoracic region: Secondary | ICD-10-CM | POA: Diagnosis not present

## 2023-07-01 DIAGNOSIS — M5440 Lumbago with sciatica, unspecified side: Secondary | ICD-10-CM | POA: Diagnosis not present

## 2023-07-01 DIAGNOSIS — M9903 Segmental and somatic dysfunction of lumbar region: Secondary | ICD-10-CM | POA: Diagnosis not present

## 2023-07-03 DIAGNOSIS — M9902 Segmental and somatic dysfunction of thoracic region: Secondary | ICD-10-CM | POA: Diagnosis not present

## 2023-07-03 DIAGNOSIS — M5440 Lumbago with sciatica, unspecified side: Secondary | ICD-10-CM | POA: Diagnosis not present

## 2023-07-03 DIAGNOSIS — M9903 Segmental and somatic dysfunction of lumbar region: Secondary | ICD-10-CM | POA: Diagnosis not present

## 2023-07-03 DIAGNOSIS — M9901 Segmental and somatic dysfunction of cervical region: Secondary | ICD-10-CM | POA: Diagnosis not present

## 2023-07-03 DIAGNOSIS — M9904 Segmental and somatic dysfunction of sacral region: Secondary | ICD-10-CM | POA: Diagnosis not present

## 2023-07-08 DIAGNOSIS — M9902 Segmental and somatic dysfunction of thoracic region: Secondary | ICD-10-CM | POA: Diagnosis not present

## 2023-07-08 DIAGNOSIS — M9903 Segmental and somatic dysfunction of lumbar region: Secondary | ICD-10-CM | POA: Diagnosis not present

## 2023-07-08 DIAGNOSIS — M5440 Lumbago with sciatica, unspecified side: Secondary | ICD-10-CM | POA: Diagnosis not present

## 2023-07-08 DIAGNOSIS — H43391 Other vitreous opacities, right eye: Secondary | ICD-10-CM | POA: Diagnosis not present

## 2023-07-08 DIAGNOSIS — M9904 Segmental and somatic dysfunction of sacral region: Secondary | ICD-10-CM | POA: Diagnosis not present

## 2023-07-08 DIAGNOSIS — M9901 Segmental and somatic dysfunction of cervical region: Secondary | ICD-10-CM | POA: Diagnosis not present

## 2023-07-16 DIAGNOSIS — H43391 Other vitreous opacities, right eye: Secondary | ICD-10-CM | POA: Diagnosis not present

## 2023-07-16 DIAGNOSIS — M9904 Segmental and somatic dysfunction of sacral region: Secondary | ICD-10-CM | POA: Diagnosis not present

## 2023-07-16 DIAGNOSIS — M9903 Segmental and somatic dysfunction of lumbar region: Secondary | ICD-10-CM | POA: Diagnosis not present

## 2023-07-16 DIAGNOSIS — M9901 Segmental and somatic dysfunction of cervical region: Secondary | ICD-10-CM | POA: Diagnosis not present

## 2023-07-16 DIAGNOSIS — H2511 Age-related nuclear cataract, right eye: Secondary | ICD-10-CM | POA: Diagnosis not present

## 2023-07-16 DIAGNOSIS — M9902 Segmental and somatic dysfunction of thoracic region: Secondary | ICD-10-CM | POA: Diagnosis not present

## 2023-07-16 DIAGNOSIS — M5440 Lumbago with sciatica, unspecified side: Secondary | ICD-10-CM | POA: Diagnosis not present

## 2023-07-26 ENCOUNTER — Other Ambulatory Visit (INDEPENDENT_AMBULATORY_CARE_PROVIDER_SITE_OTHER)

## 2023-07-26 DIAGNOSIS — E538 Deficiency of other specified B group vitamins: Secondary | ICD-10-CM

## 2023-07-26 DIAGNOSIS — E1165 Type 2 diabetes mellitus with hyperglycemia: Secondary | ICD-10-CM | POA: Diagnosis not present

## 2023-07-26 DIAGNOSIS — E559 Vitamin D deficiency, unspecified: Secondary | ICD-10-CM

## 2023-07-26 DIAGNOSIS — Z125 Encounter for screening for malignant neoplasm of prostate: Secondary | ICD-10-CM

## 2023-07-26 LAB — CBC WITH DIFFERENTIAL/PLATELET
Basophils Absolute: 0 10*3/uL (ref 0.0–0.1)
Basophils Relative: 0.5 % (ref 0.0–3.0)
Eosinophils Absolute: 0.2 10*3/uL (ref 0.0–0.7)
Eosinophils Relative: 4.1 % (ref 0.0–5.0)
HCT: 38 % — ABNORMAL LOW (ref 39.0–52.0)
Hemoglobin: 13.4 g/dL (ref 13.0–17.0)
Lymphocytes Relative: 16.2 % (ref 12.0–46.0)
Lymphs Abs: 1 10*3/uL (ref 0.7–4.0)
MCHC: 35.2 g/dL (ref 30.0–36.0)
MCV: 99.3 fl (ref 78.0–100.0)
Monocytes Absolute: 0.4 10*3/uL (ref 0.1–1.0)
Monocytes Relative: 6.6 % (ref 3.0–12.0)
Neutro Abs: 4.4 10*3/uL (ref 1.4–7.7)
Neutrophils Relative %: 72.6 % (ref 43.0–77.0)
Platelets: 223 10*3/uL (ref 150.0–400.0)
RBC: 3.83 Mil/uL — ABNORMAL LOW (ref 4.22–5.81)
RDW: 12.3 % (ref 11.5–15.5)
WBC: 6 10*3/uL (ref 4.0–10.5)

## 2023-07-26 LAB — HEPATIC FUNCTION PANEL
ALT: 25 U/L (ref 0–53)
AST: 18 U/L (ref 0–37)
Albumin: 4.9 g/dL (ref 3.5–5.2)
Alkaline Phosphatase: 87 U/L (ref 39–117)
Bilirubin, Direct: 0.2 mg/dL (ref 0.0–0.3)
Total Bilirubin: 0.6 mg/dL (ref 0.2–1.2)
Total Protein: 8 g/dL (ref 6.0–8.3)

## 2023-07-26 LAB — URINALYSIS, ROUTINE W REFLEX MICROSCOPIC
Bilirubin Urine: NEGATIVE
Hgb urine dipstick: NEGATIVE
Ketones, ur: NEGATIVE
Leukocytes,Ua: NEGATIVE
Nitrite: NEGATIVE
Specific Gravity, Urine: 1.025 (ref 1.000–1.030)
Total Protein, Urine: NEGATIVE
Urine Glucose: 100 — AB
Urobilinogen, UA: 0.2 (ref 0.0–1.0)
pH: 6 (ref 5.0–8.0)

## 2023-07-26 LAB — PSA: PSA: 5.47 ng/mL — ABNORMAL HIGH (ref 0.10–4.00)

## 2023-07-26 LAB — BASIC METABOLIC PANEL
BUN: 19 mg/dL (ref 6–23)
CO2: 23 meq/L (ref 19–32)
Calcium: 10.2 mg/dL (ref 8.4–10.5)
Chloride: 115 meq/L — ABNORMAL HIGH (ref 96–112)
Creatinine, Ser: 1.23 mg/dL (ref 0.40–1.50)
GFR: 58.93 mL/min — ABNORMAL LOW (ref >60.00)
Glucose, Bld: 229 mg/dL — ABNORMAL HIGH (ref 70–99)
Potassium: 5.1 meq/L (ref 3.5–5.1)
Sodium: 153 meq/L — ABNORMAL HIGH (ref 135–145)

## 2023-07-26 LAB — LIPID PANEL
Cholesterol: 128 mg/dL (ref 0–200)
HDL: 53.4 mg/dL (ref >39.00)
LDL Cholesterol: 57 mg/dL (ref 0–99)
NonHDL: 74.38
Total CHOL/HDL Ratio: 2
Triglycerides: 89 mg/dL (ref 0.0–149.0)
VLDL: 17.8 mg/dL (ref 0.0–40.0)

## 2023-07-26 LAB — VITAMIN B12: Vitamin B-12: 453 pg/mL (ref 211–911)

## 2023-07-26 LAB — TSH: TSH: 1.74 u[IU]/mL (ref 0.35–5.50)

## 2023-07-26 LAB — VITAMIN D 25 HYDROXY (VIT D DEFICIENCY, FRACTURES): VITD: 47.87 ng/mL (ref 30.00–100.00)

## 2023-07-26 LAB — HEMOGLOBIN A1C: Hgb A1c MFr Bld: 6.9 % — ABNORMAL HIGH (ref 4.6–6.5)

## 2023-07-29 ENCOUNTER — Encounter: Payer: Self-pay | Admitting: Internal Medicine

## 2023-07-29 DIAGNOSIS — E870 Hyperosmolality and hypernatremia: Secondary | ICD-10-CM

## 2023-07-29 LAB — MICROALBUMIN / CREATININE URINE RATIO
Creatinine,U: 103.9 mg/dL
Microalb Creat Ratio: 11.3 mg/g (ref 0.0–30.0)
Microalb, Ur: 1.2 mg/dL (ref 0.0–1.9)

## 2023-07-30 ENCOUNTER — Other Ambulatory Visit (INDEPENDENT_AMBULATORY_CARE_PROVIDER_SITE_OTHER)

## 2023-07-30 ENCOUNTER — Encounter: Payer: Self-pay | Admitting: Internal Medicine

## 2023-07-30 DIAGNOSIS — E870 Hyperosmolality and hypernatremia: Secondary | ICD-10-CM | POA: Diagnosis not present

## 2023-07-30 LAB — BASIC METABOLIC PANEL
BUN: 26 mg/dL — ABNORMAL HIGH (ref 6–23)
CO2: 27 meq/L (ref 19–32)
Calcium: 10 mg/dL (ref 8.4–10.5)
Chloride: 100 meq/L (ref 96–112)
Creatinine, Ser: 1.45 mg/dL (ref 0.40–1.50)
GFR: 48.36 mL/min — ABNORMAL LOW (ref >60.00)
Glucose, Bld: 144 mg/dL — ABNORMAL HIGH (ref 70–99)
Potassium: 4.8 meq/L (ref 3.5–5.1)
Sodium: 137 meq/L (ref 135–145)

## 2023-07-31 ENCOUNTER — Encounter: Payer: Self-pay | Admitting: Internal Medicine

## 2023-07-31 ENCOUNTER — Ambulatory Visit (INDEPENDENT_AMBULATORY_CARE_PROVIDER_SITE_OTHER): Payer: PPO | Admitting: Internal Medicine

## 2023-07-31 VITALS — BP 118/70 | HR 75 | Temp 98.2°F | Ht 73.0 in | Wt 217.0 lb

## 2023-07-31 DIAGNOSIS — E559 Vitamin D deficiency, unspecified: Secondary | ICD-10-CM

## 2023-07-31 DIAGNOSIS — E538 Deficiency of other specified B group vitamins: Secondary | ICD-10-CM | POA: Diagnosis not present

## 2023-07-31 DIAGNOSIS — Z0000 Encounter for general adult medical examination without abnormal findings: Secondary | ICD-10-CM

## 2023-07-31 DIAGNOSIS — E1340 Other specified diabetes mellitus with diabetic neuropathy, unspecified: Secondary | ICD-10-CM

## 2023-07-31 DIAGNOSIS — E7800 Pure hypercholesterolemia, unspecified: Secondary | ICD-10-CM | POA: Diagnosis not present

## 2023-07-31 NOTE — Progress Notes (Signed)
 Patient ID: Paul Tucker, male   DOB: 1950-07-12, 73 y.o.   MRN: [id number]         Chief Complaint:: wellness exam and low b12, dm, hld, low vit d       HPI:  Paul Tucker is a 73 y.o. male here for wellness exam; for tdap at pharmacy; o/w up to date                        Also sees cards Dr Bjorn Pippin yearly;  Pt denies chest pain, increased sob or doe, wheezing, orthopnea, PND, increased LE swelling, palpitations, dizziness or syncope.   Pt denies polydipsia, polyuria, or new focal neuro s/s.    Pt denies fever, wt loss, night sweats, loss of appetite, or other constitutional symptoms  Not sure he wants to start mounajro       Wt Readings from Last 3 Encounters:  07/31/23 217 lb (98.4 kg)  05/08/23 221 lb 9.6 oz (100.5 kg)  01/31/23 214 lb (97.1 kg)   BP Readings from Last 3 Encounters:  07/31/23 118/70  01/31/23 124/82  09/07/22 136/68   Immunization History  Administered Date(s) Administered   Fluad Quad(high Dose 65+) 02/28/2019, 03/09/2020, 03/03/2021, 02/26/2022   Fluad Trivalent(High Dose 65+) 02/25/2023   Influenza Whole 02/28/2006, 04/04/2007   Influenza, High Dose Seasonal PF 03/06/2017, 02/07/2018   Influenza,inj,Quad PF,6+ Mos 02/25/2014, 03/10/2015, 03/13/2016   Influenza-Unspecified 02/19/2012   PFIZER(Purple Top)SARS-COV-2 Vaccination 06/25/2019, 07/20/2019, 02/15/2020   PNEUMOCOCCAL CONJUGATE-20 07/31/2022   Pfizer Covid-19 Vaccine Bivalent Booster 45yrs & up 02/09/2021   Pneumococcal Conjugate-13 03/06/2017   Pneumococcal Polysaccharide-23 02/25/2014, 07/20/2020   Td 12/02/2008   Zoster Recombinant(Shingrix) 07/24/2018, 11/26/2018   Zoster, Live 12/04/2011   Health Maintenance Due  Topic Date Due   DTaP/Tdap/Td (2 - Tdap) 12/03/2018      Past Medical History:  Diagnosis Date   Acute prostatitis 04/04/2007   ADD 01/17/2007   Anxiety    DEPRESSION 01/17/2007   Treatment Resident Depression   Dermatophytosis of groin and perianal area 05/31/2008    DIVERTICULOSIS, COLON 04/06/2007   Dysuria 01/27/2009   FLANK PAIN, LEFT 09/30/2007   Flushing 08/26/2009   GLUCOSE INTOLERANCE 04/04/2007   Gross hematuria 01/27/2009   HEMATOCHEZIA 07/26/2008   HYPERLIPIDEMIA 01/20/2007   HYPOGONADISM 06/13/2009   Impaired glucose tolerance 12/10/2010   INSOMNIA 04/04/2007   LIBIDO, DECREASED 12/02/2008   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   LOW BACK PAIN 01/20/2007   OBSTRUCTIVE SLEEP APNEA 04/04/2007   OTITIS MEDIA, ACUTE, BILATERAL 06/13/2009   PANCREATITIS, HX OF 01/17/2007   PSA, INCREASED 11/18/2009   RASH-NONVESICULAR 08/26/2009   SINUSITIS- ACUTE-NOS 04/04/2007   SWEATING 09/30/2007   THRUSH 04/04/2007   TONSILLECTOMY AND ADENOIDECTOMY, HX OF 01/17/2007   URI 07/25/2010   URINARY RETENTION 01/27/2009   VERTIGO 06/13/2009   Past Surgical History:  Procedure Laterality Date   CHOLECYSTECTOMY  2005   HEMORRHOID BANDING  2015   HERNIA REPAIR  1957   INGUINAL HERNIA REPAIR Right 07/27/2020   Procedure: RIGHT INGUINAL HERNIA REPAIR WITH MESH;  Surgeon: Harriette Bouillon, MD;  Location: [facility] OR;  Service: General;  Laterality: Right;   TONSILLECTOMY     VASECTOMY  1987    reports that he has never smoked. He has never used smokeless tobacco. He reports that he does not drink alcohol and does not use drugs. family history includes Coronary artery disease in an other family member; Diabetes in an other family member;  Emphysema in his father; Heart disease in his father. Allergies  Allergen Reactions   Atrovent [Ipratropium] Swelling    Throat swelling   Pravastatin Sodium Nausea Only    Pt does not recall this reaction   Current Outpatient Medications on File Prior to Visit  Medication Sig Dispense Refill   amantadine (SYMMETREL) 100 MG capsule Take 100 mg by mouth 2 (two) times daily.     aspirin 81 MG EC tablet Take 81 mg by mouth daily.     celecoxib (CELEBREX) 200 MG capsule TAKE 1 CAPSULE BY MOUTH TWICE A DAY AS NEEDED 180 capsule 1   Cholecalciferol (VITAMIN  D3 PO) Take 4,000 Units by mouth.     Ciclopirox 0.77 % gel USE AS DIRECTED TOPCIALLY 45 g 1   cyclobenzaprine (FLEXERIL) 5 MG tablet Take 1 tablet (5 mg total) by mouth 3 (three) times daily as needed for muscle spasms. 40 tablet 2   diazepam (VALIUM) 10 MG tablet Take 10 mg by mouth daily as needed.     diclofenac Sodium (VOLTAREN) 1 % GEL Apply 4 g topically in the morning and at bedtime.     diphenhydrAMINE (BENADRYL) 25 MG tablet Take 25 mg by mouth every 6 (six) hours as needed for allergies.     doxycycline (VIBRA-TABS) 100 MG tablet Take 1 tablet (100 mg total) by mouth 2 (two) times daily. 42 tablet 0   fluocinonide cream (LIDEX) 0.05 % Apply 1 Application topically 2 (two) times daily. 30 g 1   LORazepam (ATIVAN) 0.5 MG tablet Take 0.5 mg by mouth 2 (two) times daily as needed.     lovastatin (MEVACOR) 40 MG tablet Take 1 tablet (40 mg total) by mouth at bedtime.     meclizine (ANTIVERT) 25 MG tablet TAKE ONE TABLET BY MOUTH THREE TIMES A DAY AS NEEDED FOR DIZZINESS 30 tablet 2   mometasone (ELOCON) 0.1 % cream APPLY TO AFFECTED AREA(S) DAILY 45 g 0   OLANZapine (ZYPREXA) 2.5 MG tablet Take 2.5 mg by mouth at bedtime.     ondansetron (ZOFRAN-ODT) 4 MG disintegrating tablet Take 1 tablet (4 mg total) by mouth every 8 (eight) hours as needed for nausea or vomiting. 20 tablet 0   tamsulosin (FLOMAX) 0.4 MG CAPS capsule TAKE ONE CAPSULE BY MOUTH DAILY 90 capsule 3   TRINTELLIX 20 MG TABS tablet Take 20 mg by mouth daily.     zolpidem (AMBIEN) 10 MG tablet TAKE 1 TABLET BY MOUTH EVERY NIGHT AT BEDTIME 90 tablet 1   No current facility-administered medications on file prior to visit.        ROS:  All others reviewed and negative.  Objective        PE:  BP 118/70 (BP Location: Right Arm, Patient Position: Sitting, Cuff Size: Normal)   Pulse 75   Temp 98.2 F (36.8 C) (Oral)   Ht 6\' 1"  (1.854 m)   Wt 217 lb (98.4 kg)   SpO2 97%   BMI 28.63 kg/m                 Constitutional: Pt  appears in NAD               HENT: Head: NCAT.                Right Ear: External ear normal.                 Left Ear: External ear normal.  Eyes: . Pupils are equal, round, and reactive to light. Conjunctivae and EOM are normal               Nose: without d/c or deformity               Neck: Neck supple. Gross normal ROM               Cardiovascular: Normal rate and regular rhythm.                 Pulmonary/Chest: Effort normal and breath sounds without rales or wheezing.                Abd:  Soft, NT, ND, + BS, no organomegaly               Neurological: Pt is alert. At baseline orientation, motor grossly intact               Skin: Skin is warm. No rashes, no other new lesions, LE edema - none               Psychiatric: Pt behavior is normal without agitation   Micro: none  Cardiac tracings I have personally interpreted today:  none  Pertinent Radiological findings (summarize): none   Lab Results  Component Value Date   WBC 6.0 07/26/2023   HGB 13.4 07/26/2023   HCT 38.0 (L) 07/26/2023   PLT 223.0 07/26/2023   GLUCOSE 144 (H) 07/30/2023   CHOL 128 07/26/2023   TRIG 89.0 07/26/2023   HDL 53.40 07/26/2023   LDLDIRECT 82.0 02/28/2017   LDLCALC 57 07/26/2023   ALT 25 07/26/2023   AST 18 07/26/2023   NA 137 07/30/2023   K 4.8 07/30/2023   CL 100 07/30/2023   CREATININE 1.45 07/30/2023   BUN 26 (H) 07/30/2023   CO2 27 07/30/2023   TSH 1.74 07/26/2023   PSA 5.47 (H) 07/26/2023   HGBA1C 6.9 (H) 07/26/2023   MICROALBUR 1.2 07/26/2023   Assessment/Plan:  Paul Tucker is a 73 y.o. White or Caucasian [1] male with  has a past medical history of Acute prostatitis (04/04/2007), ADD (01/17/2007), Anxiety, DEPRESSION (01/17/2007), Dermatophytosis of groin and perianal area (05/31/2008), DIVERTICULOSIS, COLON (04/06/2007), Dysuria (01/27/2009), FLANK PAIN, LEFT (09/30/2007), Flushing (08/26/2009), GLUCOSE INTOLERANCE (04/04/2007), Gross hematuria (01/27/2009), HEMATOCHEZIA  (07/26/2008), HYPERLIPIDEMIA (01/20/2007), HYPOGONADISM (06/13/2009), Impaired glucose tolerance (12/10/2010), INSOMNIA (04/04/2007), LIBIDO, DECREASED (12/02/2008), LICHEN SIMPLEX CHRONICUS (05/31/2008), LOW BACK PAIN (01/20/2007), OBSTRUCTIVE SLEEP APNEA (04/04/2007), OTITIS MEDIA, ACUTE, BILATERAL (06/13/2009), PANCREATITIS, HX OF (01/17/2007), PSA, INCREASED (11/18/2009), RASH-NONVESICULAR (08/26/2009), SINUSITIS- ACUTE-NOS (04/04/2007), SWEATING (09/30/2007), THRUSH (04/04/2007), TONSILLECTOMY AND ADENOIDECTOMY, HX OF (01/17/2007), URI (07/25/2010), URINARY RETENTION (01/27/2009), and VERTIGO (06/13/2009).  Preventative health care Age and sex appropriate education and counseling updated with regular exercise and diet Referrals for preventative services - none needed Immunizations addressed - for tdap at pharmacy Smoking counseling  - none needed Evidence for depression or other mood disorder - none significant Most recent labs reviewed. I have personally reviewed and have noted: 1) the patient's medical and social history 2) The patient's current medications and supplements 3) The patient's height, weight, and BMI have been recorded in the chart   B12 deficiency Lab Results  Component Value Date   VITAMINB12 453 07/26/2023   Stable, cont oral replacement - b12 1000 mcg qd   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HCC) Lab Results  Component Value Date   HGBA1C 6.9 (H) 07/26/2023   Uncontrolled,, pt to continue current medical treatment  -  diet,wt control, declines mounjaro for now   Hyperlipidemia Lab Results  Component Value Date   LDLCALC 57 07/26/2023   Stable, pt to continue current statin lovastatin 40 mg qd   Vitamin D deficiency Last vitamin D Lab Results  Component Value Date   VD25OH 47.87 07/26/2023   Stable, cont oral replacement  Followup: Return in about 6 months (around 01/31/2024).  Oliver Barre, MD 08/03/2023 9:38 PM [facility]

## 2023-07-31 NOTE — Patient Instructions (Addendum)
 Please consider the Tdap tetanus shot at the pharmacy  Please call if you would want to start the Mounjaro 2.5 mg weekly  Please continue all other medications as before, and refills have been done if requested.  Please have the pharmacy call with any other refills you may need.  Please continue your efforts at being more active, low cholesterol diet, and weight control.  You are otherwise up to date with prevention measures today.  Please keep your appointments with your specialists as you may have planned  Please make an Appointment to return in 6 months, or sooner if needed, also with Lab Appointment for testing done 3-5 days before at the FIRST FLOOR Lab (so this is for TWO appointments - please see the scheduling desk as you leave)

## 2023-08-03 ENCOUNTER — Encounter: Payer: Self-pay | Admitting: Internal Medicine

## 2023-08-03 NOTE — Assessment & Plan Note (Signed)
 Lab Results  Component Value Date   VITAMINB12 453 07/26/2023   Stable, cont oral replacement - b12 1000 mcg qd

## 2023-08-03 NOTE — Assessment & Plan Note (Signed)
 Lab Results  Component Value Date   LDLCALC 57 07/26/2023   Stable, pt to continue current statin lovastatin 40 mg qd

## 2023-08-03 NOTE — Assessment & Plan Note (Signed)
 Lab Results  Component Value Date   HGBA1C 6.9 (H) 07/26/2023   Uncontrolled,, pt to continue current medical treatment  - diet,wt control, declines mounjaro for now

## 2023-08-03 NOTE — Assessment & Plan Note (Signed)
 Last vitamin D Lab Results  Component Value Date   VD25OH 47.87 07/26/2023   Stable, cont oral replacement

## 2023-08-03 NOTE — Assessment & Plan Note (Signed)

## 2023-08-05 ENCOUNTER — Other Ambulatory Visit: Payer: Self-pay | Admitting: Internal Medicine

## 2023-08-06 ENCOUNTER — Other Ambulatory Visit: Payer: Self-pay

## 2023-08-16 ENCOUNTER — Other Ambulatory Visit (HOSPITAL_BASED_OUTPATIENT_CLINIC_OR_DEPARTMENT_OTHER): Payer: Self-pay

## 2023-08-16 MED ORDER — LORAZEPAM 1 MG PO TABS
0.5000 mg | ORAL_TABLET | Freq: Every day | ORAL | 0 refills | Status: DC | PRN
  Filled 2023-08-16: qty 15, 30d supply, fill #0

## 2023-08-25 ENCOUNTER — Ambulatory Visit (HOSPITAL_COMMUNITY)
Admission: RE | Admit: 2023-08-25 | Discharge: 2023-08-25 | Disposition: A | Discharge status: Home / Self Care | Source: Ambulatory Visit | Attending: Internal Medicine | Admitting: Internal Medicine

## 2023-08-25 ENCOUNTER — Encounter (HOSPITAL_COMMUNITY): Payer: Self-pay

## 2023-08-25 VITALS — BP 118/72 | HR 75 | Temp 98.1°F | Resp 16

## 2023-08-25 DIAGNOSIS — J301 Allergic rhinitis due to pollen: Secondary | ICD-10-CM

## 2023-08-25 MED ORDER — PREDNISONE 20 MG PO TABS: 40.0000 mg | ORAL_TABLET | Freq: Every day | ORAL | 0 refills | Status: AC

## 2023-08-25 NOTE — ED Provider Notes (Signed)
 [facility]    CSN: [id number] Arrival date & time: 08/25/23  1007      History   Chief Complaint Chief Complaint  Patient presents with   Nasal Congestion    HPI Paul Tucker is a 73 y.o. male.   73 year old male who presents to urgent care with complaints of postnasal drip and soreness to the palate in the posterior aspect.  He reports that this started about 3 days ago.  He denies any significant nasal congestion although he does have a somewhat runny nose at times.  He denies fevers, chills, cough, shortness of breath, rash, poor appetite, fatigue, body aches.  He does not have a history of seasonal allergies.  He has been using Benadryl.     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   LOW BACK PAIN 01/20/2007   OBSTRUCTIVE SLEEP APNEA 04/04/2007   OTITIS MEDIA, ACUTE, BILATERAL 06/13/2009   PANCREATITIS, HX OF 01/17/2007   PSA, INCREASED 11/18/2009   RASH-NONVESICULAR 08/26/2009   SINUSITIS- ACUTE-NOS 04/04/2007   SWEATING 09/30/2007   THRUSH 04/04/2007   TONSILLECTOMY AND ADENOIDECTOMY, HX OF 01/17/2007   URI 07/25/2010   URINARY RETENTION 01/27/2009   VERTIGO 06/13/2009    Patient Active Problem List   Diagnosis Date Noted   Cataract 01/25/2023   Epigastric pain 02/03/2022   Lumbar degenerative disc disease 07/28/2021   Leg length discrepancy 06/27/2021   Right leg pain 06/25/2021   Right inguinal hernia 06/09/2020   Vitamin D  deficiency 07/25/2019   Elevated PSA 07/23/2019   Abnormal urinalysis 07/23/2019   Weight loss 07/19/2017   Decrease in appetite 07/19/2017   Anxiety 09/11/2016   Microalbuminuria 03/13/2016   Allergic rhinitis 09/13/2015   Allergic conjunctivitis 09/13/2015   B12 deficiency 08/14/2013   Internal hemorrhoids with other complication 06/22/2013   Fecal incontinence 11/12/2012   Dyspepsia and other specified disorders of function of stomach 11/12/2012   Right hip pain 06/11/2012   Dizziness 11/19/2011   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HCC) 12/10/2010   Preventative health care 09/10/2010   PSA, INCREASED 11/18/2009   HYPOGONADISM 06/13/2009   Gross hematuria 01/27/2009   URINARY RETENTION 01/27/2009   LIBIDO, DECREASED 12/02/2008   Dermatophytosis of groin and perianal area 05/31/2008   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   Diverticulosis of large intestine 04/06/2007   OBSTRUCTIVE SLEEP APNEA 04/04/2007   INSOMNIA 04/04/2007   Hyperlipidemia 01/20/2007   GERD 01/20/2007   LOW BACK PAIN 01/20/2007   Depression 01/17/2007   History of digestive system disease 01/17/2007   TONSILLECTOMY AND ADENOIDECTOMY, HX OF 01/17/2007    Past Surgical History:  Procedure Laterality Date   CHOLECYSTECTOMY  05/22/2003   EYE SURGERY     HEMORRHOID BANDING  05/21/2013   HERNIA REPAIR  05/22/1955   INGUINAL HERNIA REPAIR Right 07/27/2020   Procedure: RIGHT INGUINAL HERNIA REPAIR WITH MESH;  Surgeon: Harriette Bouillon, MD;  Location: [facility] OR;  Service: General;  Laterality: Right;  TONSILLECTOMY     VASECTOMY  05/21/1985       Home Medications    Prior to Admission medications   Medication Sig Start Date End Date Taking? Authorizing Provider  amantadine (SYMMETREL) 100 MG capsule Take 100 mg by mouth 2 (two) times daily. 07/07/21   [provider]  aspirin 81 MG EC tablet Take 81 mg by mouth daily.    [provider]  celecoxib (CELEBREX) 200 MG capsule TAKE 1  CAPSULE BY MOUTH TWICE A DAY AS NEEDED 08/06/23   Corwin Levins, MD  Cholecalciferol (VITAMIN D3 PO) Take 4,000 Units by mouth.    [provider]  Ciclopirox 0.77 % gel USE AS DIRECTED TOPCIALLY 01/19/22   Corwin Levins, MD  cyclobenzaprine (FLEXERIL) 5 MG tablet Take 1 tablet (5 mg total) by mouth 3 (three) times daily as needed for muscle spasms. 02/23/22   Corwin Levins, MD  diazepam (VALIUM) 10 MG tablet Take 10 mg by mouth daily as needed. 10/29/22   [provider]  diclofenac Sodium (VOLTAREN) 1 % GEL Apply 4 g topically in the morning and at bedtime.    [provider]  diphenhydrAMINE (BENADRYL) 25 MG tablet Take 25 mg by mouth every 6 (six) hours as needed for allergies.    [provider]  doxycycline (VIBRA-TABS) 100 MG tablet Take 1 tablet (100 mg total) by mouth 2 (two) times daily. 07/31/22   Corwin Levins, MD  fluocinonide cream (LIDEX) 0.05 % Apply 1 Application topically 2 (two) times daily. 01/19/22   Corwin Levins, MD  LORazepam (ATIVAN) 0.5 MG tablet Take 0.5 mg by mouth 2 (two) times daily as needed. 07/18/20   [provider]  LORazepam (ATIVAN) 1 MG tablet Take 0.5-1 tablets (0.5-1 mg total) by mouth daily as needed. 08/16/23     lovastatin (MEVACOR) 40 MG tablet Take 1 tablet (40 mg total) by mouth at bedtime. 02/12/23   Corwin Levins, MD  meclizine (ANTIVERT) 25 MG tablet TAKE ONE TABLET BY MOUTH THREE TIMES A DAY AS NEEDED FOR DIZZINESS 11/22/21   Corwin Levins, MD  mometasone (ELOCON) 0.1 % cream APPLY TO AFFECTED AREA(S) DAILY 01/17/22   Corwin Levins, MD  OLANZapine (ZYPREXA) 2.5 MG tablet Take 2.5 mg by mouth at bedtime. 06/26/21   [provider]  ondansetron (ZOFRAN-ODT) 4 MG disintegrating tablet Take 1 tablet (4 mg total) by mouth every 8 (eight) hours as needed for nausea or vomiting. 07/11/22   Corwin Levins, MD  tamsulosin (FLOMAX) 0.4 MG CAPS capsule TAKE ONE CAPSULE BY MOUTH DAILY 08/01/21   Corwin Levins, MD  TRINTELLIX 20  MG TABS tablet Take 20 mg by mouth daily. 01/08/20   [provider]  zolpidem (AMBIEN) 10 MG tablet TAKE 1 TABLET BY MOUTH EVERY NIGHT AT BEDTIME 04/29/23   Corwin Levins, MD    Family History Family History  Problem Relation Age of Onset   Emphysema Father    Heart disease Father    Coronary artery disease Other        male, 1st degree relative   Diabetes Other        1st degree relative   Colon cancer Neg Hx    Esophageal cancer Neg Hx    Rectal cancer Neg Hx    Stomach cancer Neg Hx     Social History Social History   Tobacco Use   Smoking status: Never   Smokeless tobacco: Never  Vaping Use   Vaping status: Never Used  Substance Use Topics   Alcohol use: Never    Alcohol/week: 0.0 standard drinks of alcohol   Drug use: No     Allergies   Atrovent [ipratropium] and Pravastatin sodium   Review of Systems Review of Systems  Constitutional:  Negative for chills and fever.  HENT:  Positive for postnasal drip. Negative for ear pain and sore throat.        Roof of mouth toward back is sore.   Eyes:  Negative for pain and visual disturbance.  Respiratory:  Negative for cough and shortness of breath.   Cardiovascular:  Negative for chest pain and palpitations.  Gastrointestinal:  Negative for abdominal pain and vomiting.  Genitourinary:  Negative for dysuria and hematuria.  Musculoskeletal:  Negative for arthralgias and back pain.  Skin:  Negative for color change and rash.  Neurological:  Negative for seizures and syncope.  All other systems reviewed and are negative.    Physical Exam Triage Vital Signs ED Triage Vitals [08/25/23 1025]  Encounter Vitals Group     BP 118/72     Systolic BP Percentile      Diastolic BP Percentile      Pulse Rate 75     Resp 16     Temp 98.1 F (36.7 C)     Temp Source Oral     SpO2 95 %     Weight      Height      Head Circumference      Peak Flow      Pain Score 6     Pain Loc      Pain Education       Exclude from Growth Chart    No data found.  Updated Vital Signs BP 118/72 (BP Location: Left Arm)   Pulse 75   Temp 98.1 F (36.7 C) (Oral)   Resp 16   SpO2 95%   Visual Acuity Right Eye Distance:   Left Eye Distance:   Bilateral Distance:    Right Eye Near:   Left Eye Near:    Bilateral Near:     Physical Exam Vitals and nursing note reviewed.  Constitutional:      General: He is not in acute distress.    Appearance: He is well-developed.  HENT:     Head: Normocephalic and atraumatic.     Right Ear: Tympanic membrane normal.     Left Ear: Tympanic membrane normal.     Nose: Nose normal.     Mouth/Throat:     Mouth: Mucous membranes are moist.     Pharynx: Posterior oropharyngeal erythema (Very mild) present.  Eyes:     Conjunctiva/sclera: Conjunctivae normal.  Cardiovascular:     Rate and Rhythm: Normal rate and regular rhythm.     Heart sounds: No murmur heard. Pulmonary:     Effort: Pulmonary effort is normal. No respiratory distress.     Breath sounds: Normal breath sounds.  Abdominal:     Palpations: Abdomen is soft.     Tenderness: There is no abdominal tenderness.  Musculoskeletal:        General: No swelling.     Cervical back: Neck supple.  Skin:    General: Skin is warm and dry.     Capillary Refill: Capillary refill takes less than 2 seconds.  Neurological:     Mental Status: He is alert.  Psychiatric:        Mood and Affect:  Mood normal.      UC Treatments / Results  Labs (all labs ordered are listed, but only abnormal results are displayed) Labs Reviewed - No data to display  EKG   Radiology No results found.  Procedures Procedures (including critical care time)  Medications Ordered in UC Medications - No data to display  Initial Impression / Assessment and Plan / UC Course  I have reviewed the triage vital signs and the nursing notes.  Pertinent labs & imaging results that were available during my care of the patient were  reviewed by me and considered in my medical decision making (see chart for details).     Seasonal allergic rhinitis due to pollen   Symptoms and physical exam findings are more consistent with allergic rhinitis. This is not infectious and does not require antibiotics. We can treat with the following:  Prednisone 40 mg (2 tablets) once daily for 5 days. Take this in the morning.  This is a steroid to help with inflammation and pain and allergic reaction.  Can try over the counter medications like zyrtec or Claritin or allegra  Can try wearing a mask when outside to help reduce the allergen.  Return to urgent care or PCP if symptoms worsen or fail to resolve.    Final Clinical Impressions(s) / UC Diagnoses   Final diagnoses:  None   Discharge Instructions   None    ED Prescriptions   None    PDMP not reviewed this encounter.   Landis Martins, [place] 08/25/23 1055

## 2023-08-25 NOTE — Discharge Instructions (Addendum)
 Symptoms and physical exam findings are more consistent with allergic rhinitis. This is not infectious and does not require antibiotics. We can treat with the following:  Prednisone 40 mg (2 tablets) once daily for 5 days. Take this in the morning.  This is a steroid to help with inflammation and pain and allergic reaction.  Can try over the counter medications like zyrtec or Claritin or allegra  Can try wearing a mask when outside to help reduce the allergen.  Return to urgent care or PCP if symptoms worsen or fail to resolve.

## 2023-08-25 NOTE — ED Triage Notes (Signed)
 Patient reports that he has had nasal congestion and soreness of the roof of his mouth x 3 days. Patient denies being the throat.  Patient states that he has been taking Benadryl for his symptoms.

## 2023-08-28 ENCOUNTER — Encounter: Payer: Self-pay | Admitting: Internal Medicine

## 2023-08-28 MED ORDER — BENZONATATE 100 MG PO CAPS: ORAL_CAPSULE | ORAL | 1 refills | Status: AC

## 2023-09-05 ENCOUNTER — Ambulatory Visit: Payer: Self-pay

## 2023-09-05 ENCOUNTER — Other Ambulatory Visit: Payer: Self-pay | Admitting: Internal Medicine

## 2023-09-05 MED ORDER — AZITHROMYCIN 250 MG PO TABS: ORAL_TABLET | ORAL | 1 refills | Status: DC

## 2023-09-05 MED ORDER — AZITHROMYCIN 250 MG PO TABS: ORAL_TABLET | ORAL | 1 refills | Status: AC

## 2023-09-05 NOTE — Telephone Encounter (Signed)
 Ok this is done, thanks

## 2023-09-05 NOTE — Addendum Note (Signed)
 Addended by: Roslyn Coombe on: 09/05/2023 12:14 PM   Modules accepted: Orders

## 2023-09-05 NOTE — Telephone Encounter (Signed)
 Ok done erx

## 2023-09-05 NOTE — Telephone Encounter (Signed)
  Chief Complaint: Sinus Congestion Symptoms: yellow to greenish bloody discharge Frequency: over a week Pertinent Negatives: Patient denies CP, SOB, fever Disposition: [] ED /[] Urgent Care (no appt availability in office) / [] Appointment(In office/virtual)/ []  [facility]/ [] Home Care/ [] Refused Recommended Disposition /[] [facility]/ [x]  Follow-up with PCP Additional Notes: patient reports that he was seen at [facility] for possible sinus infection. Patient was told that he didn't have a need for antibiotic but was started on prednisone. Patient states he has been taking Coricidin HBP and reports some improvement but he continues to have yellow to greenish bloody discharge. Patient is asking for antibiotic be sent to his pharmacy. Patient is requesting for office to call him back to let him know that it was sent. All questions answered. Patient verbalized understanding.     Copied from CRM [id number]. Topic: Clinical - Red Word Triage >> Sep 05, 2023 11:02 AM Dimple Francis wrote: Red Word that prompted transfer to Nurse Triage: Patient was seen at Urgent care last week for a possible sinus infection. It is still happening and now he is blowing a lot of blood out of his nose. Reason for Disposition  [1] Nasal discharge AND [2] present > 10 days  Answer Assessment - Initial Assessment Questions 1. LOCATION: "Where does it hurt?"      No pain currently 2. ONSET: "When did the sinus pain start?"  (e.g., hours, days)      Was seen a week ago at [facility] for possible sinus infection 3. SEVERITY: "How bad is the pain?"   (Scale 1-10; mild, moderate or severe)   - MILD (1-3): doesn't interfere with normal activities    - MODERATE (4-7): interferes with normal activities (e.g., work or school) or awakens from sleep   - SEVERE (8-10): excruciating pain and patient unable to do any normal activities        No pain currently 4. RECURRENT SYMPTOM: "Have you ever had sinus problems before?" If Yes,  ask: "When was the last time?" and "What happened that time?"      no 5. NASAL CONGESTION: "Is the nose blocked?" If Yes, ask: "Can you open it or must you breathe through your mouth?"     Nose isn't blocked. Patient is a mouth breather 6. NASAL DISCHARGE: "Do you have discharge from your nose?" If so ask, "What color?"     Yellowish-green with blood mixed in 7. FEVER: "Do you have a fever?" If Yes, ask: "What is it, how was it measured, and when did it start?"      no 8. OTHER SYMPTOMS: "Do you have any other symptoms?" (e.g., sore throat, cough, earache, difficulty breathing)     no  Protocols used: Sinus Pain or Congestion-A-AH

## 2023-09-06 ENCOUNTER — Ambulatory Visit (HOSPITAL_COMMUNITY)

## 2023-09-17 ENCOUNTER — Ambulatory Visit: Payer: PPO | Admitting: Cardiology

## 2023-09-23 DIAGNOSIS — M5440 Lumbago with sciatica, unspecified side: Secondary | ICD-10-CM | POA: Diagnosis not present

## 2023-09-23 DIAGNOSIS — M9902 Segmental and somatic dysfunction of thoracic region: Secondary | ICD-10-CM | POA: Diagnosis not present

## 2023-09-23 DIAGNOSIS — M9901 Segmental and somatic dysfunction of cervical region: Secondary | ICD-10-CM | POA: Diagnosis not present

## 2023-09-23 DIAGNOSIS — M9903 Segmental and somatic dysfunction of lumbar region: Secondary | ICD-10-CM | POA: Diagnosis not present

## 2023-09-26 DIAGNOSIS — M9901 Segmental and somatic dysfunction of cervical region: Secondary | ICD-10-CM | POA: Diagnosis not present

## 2023-09-26 DIAGNOSIS — M5440 Lumbago with sciatica, unspecified side: Secondary | ICD-10-CM | POA: Diagnosis not present

## 2023-09-26 DIAGNOSIS — M9903 Segmental and somatic dysfunction of lumbar region: Secondary | ICD-10-CM | POA: Diagnosis not present

## 2023-09-26 DIAGNOSIS — M9902 Segmental and somatic dysfunction of thoracic region: Secondary | ICD-10-CM | POA: Diagnosis not present

## 2023-09-30 NOTE — Progress Notes (Unsigned)
 Cardiology Office Note:    Date:  10/01/2023   ID:  Paul Tucker, DOB [birth year], MRN [id number]  PCP:  Roslyn Coombe, MD  Cardiologist:  Wendie Hamburg, MD  Electrophysiologist:  None   Referring MD: Roslyn Coombe, MD   No chief complaint on file.   History of Present Illness:    Paul Tucker is a 73 y.o. male with a hx of hyperlipidemia, ADHD, depression, OSA who presents for follow-up.  He was referred by Dr. Autry Legions for evaluation of CAD, initially seen on 03/02/2021.  Underwent calcium score 02/09/2021, which was 1216 (89th percentile).  He had previous history of OSA but lost 50 pounds and is now off CPAP.  No smoking history.  Father had MI in 63s and had ICD.  Lexiscan  Myoview  on 03/08/2021 showed normal perfusion, EF 63%.  Since last clinic visit, he reports he is doing well. Denies any chest pain, dyspnea, lightheadedness, syncope, lower extremity edema, or palpitations.  Exercises 2-3 times at gym for an hour, denies any exertional symptoms.   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   LOW BACK PAIN 01/20/2007   OBSTRUCTIVE SLEEP APNEA 04/04/2007   OTITIS MEDIA, ACUTE, BILATERAL 06/13/2009   PANCREATITIS, HX OF 01/17/2007   PSA, INCREASED 11/18/2009   RASH-NONVESICULAR 08/26/2009   SINUSITIS- ACUTE-NOS 04/04/2007   SWEATING 09/30/2007   THRUSH 04/04/2007   TONSILLECTOMY AND ADENOIDECTOMY, HX OF 01/17/2007   URI 07/25/2010   URINARY  RETENTION 01/27/2009   VERTIGO 06/13/2009    Past Surgical History:  Procedure Laterality Date   CHOLECYSTECTOMY  05/22/2003   EYE SURGERY     HEMORRHOID BANDING  05/21/2013   HERNIA REPAIR  05/22/1955   INGUINAL HERNIA REPAIR Right 07/27/2020   Procedure: RIGHT INGUINAL HERNIA REPAIR WITH MESH;  Surgeon: Sim Dryer, MD;  Location: [facility] OR;  Service: General;  Laterality: Right;   TONSILLECTOMY     VASECTOMY  05/21/1985    Current Medications: Current Meds  Medication Sig   amantadine (SYMMETREL) 100 MG capsule Take 100 mg by mouth 2 (two) times daily.   aspirin 81 MG EC tablet Take 81 mg by mouth daily.   benzonatate  (TESSALON  PERLES) 100 MG capsule 1-2 tab by mouth every 8 hrs as needed for cough   celecoxib  (CELEBREX ) 200 MG capsule TAKE 1 CAPSULE BY MOUTH TWICE A DAY AS NEEDED   Cholecalciferol (VITAMIN D3 PO) Take 4,000 Units by mouth.   Ciclopirox  0.77 % gel USE AS DIRECTED TOPCIALLY   cyclobenzaprine  (FLEXERIL ) 5 MG tablet Take 1 tablet (5 mg total) by mouth 3 (three) times daily as needed for muscle spasms.   diazepam (VALIUM) 10 MG tablet Take 10 mg by mouth daily as needed.   diclofenac Sodium (VOLTAREN) 1 % GEL Apply 4 g topically in the morning and at bedtime.   diphenhydrAMINE (BENADRYL) 25 MG tablet Take 25 mg by mouth every 6 (six) hours as needed for allergies.  doxycycline  (VIBRA -TABS) 100 MG tablet Take 1 tablet (100 mg total) by mouth 2 (two) times daily.   finasteride (PROSCAR) 5 MG tablet Take 5 mg by mouth daily.   fluocinonide  cream (LIDEX ) 0.05 % Apply 1 Application topically 2 (two) times daily.   LORazepam  (ATIVAN ) 0.5 MG tablet Take 0.5 mg by mouth 2 (two) times daily as needed.   LORazepam  (ATIVAN ) 1 MG tablet Take 0.5-1 tablets (0.5-1 mg total) by mouth daily as needed.   lovastatin  (MEVACOR ) 40 MG tablet Take 1 tablet (40 mg total) by mouth at bedtime.   meclizine  (ANTIVERT ) 25 MG tablet TAKE ONE TABLET BY MOUTH THREE TIMES A DAY AS NEEDED FOR  DIZZINESS   mometasone  (ELOCON ) 0.1 % cream APPLY TO AFFECTED AREA(S) DAILY   OLANZapine (ZYPREXA) 2.5 MG tablet Take 2.5 mg by mouth at bedtime.   ondansetron  (ZOFRAN -ODT) 4 MG disintegrating tablet Take 1 tablet (4 mg total) by mouth every 8 (eight) hours as needed for nausea or vomiting.   tamsulosin  (FLOMAX ) 0.4 MG CAPS capsule TAKE ONE CAPSULE BY MOUTH DAILY   TRINTELLIX 20 MG TABS tablet Take 20 mg by mouth daily.   zolpidem  (AMBIEN ) 10 MG tablet TAKE 1 TABLET BY MOUTH EVERY NIGHT AT BEDTIME     Allergies:   Atrovent  [ipratropium] and Pravastatin sodium   Social History   Socioeconomic History   Marital status: Married    Spouse name: Not on file   Number of children: 3   Years of education: Not on file   Highest education level: Some college, no degree  Occupational History   Occupation: Estate agent  Tobacco Use   Smoking status: Never   Smokeless tobacco: Never  Vaping Use   Vaping status: Never Used  Substance and Sexual Activity   Alcohol use: Never    Alcohol/week: 0.0 standard drinks of alcohol   Drug use: No   Sexual activity: Not on file  Other Topics Concern   Not on file  Social History Narrative   Not on file   Social Drivers of Health   Financial Resource Strain: Low Risk  (07/28/2023)   Overall Financial Resource Strain (CARDIA)    Difficulty of Paying Living Expenses: Not hard at all  Food Insecurity: No Food Insecurity (07/28/2023)   Hunger Vital Sign    Worried About Running Out of Food in the Last Year: Never true    Ran Out of Food in the Last Year: Never true  Transportation Needs: No Transportation Needs (07/28/2023)   PRAPARE - Administrator, Civil Service (Medical): No    Lack of Transportation (Non-Medical): No  Physical Activity: Insufficiently Active (07/28/2023)   Exercise Vital Sign    Days of Exercise per Week: 2 days    Minutes of Exercise per Session: 40 min  Stress: No Stress Concern Present (07/28/2023)   [employer] of Occupational Health - Occupational Stress Questionnaire    Feeling of Stress : Only a little  Social Connections: Unknown (07/28/2023)   Social Connection and Isolation Panel [NHANES]    Frequency of Communication with Friends and Family: Once a week    Frequency of Social Gatherings with Friends and Family: Once a week    Attends Religious Services: Patient declined    Database administrator or Organizations: No    Attends Banker Meetings: Never    Marital Status: Married  Recent Concern: Social Connections - Moderately Isolated (05/08/2023)   Social Connection and Isolation  Panel [NHANES]    Frequency of Communication with Friends and Family: Twice a week    Frequency of Social Gatherings with Friends and Family: Never    Attends Religious Services: More than 4 times per year    Active Member of [employer] or Organizations: No    Attends Engineer, structural: Never    Marital Status: Married     Family History: The patient's family history includes Coronary artery disease in an other family member; Diabetes in an other family member; Emphysema in his father; Heart disease in his father. There is no history of Colon cancer, Esophageal cancer, Rectal cancer, or Stomach cancer.  ROS:   Please see the history of present illness.     All other systems reviewed and are negative.  EKGs/Labs/Other Studies Reviewed:    The following studies were reviewed today:   EKG:   09/04/2021: Normal sinus rhythm, rate 69, no ST abnormalities 09/07/22: Normal sinus rhythm, rate 70, no ST abnormality 10/01/2023: Normal sinus rhythm, rate 82, no ST abnormalities  Recent Labs: 07/26/2023: ALT 25; Hemoglobin 13.4; Platelets 223.0; TSH 1.74 07/30/2023: BUN 26; Creatinine, Ser 1.45; Potassium 4.8; Sodium 137  Recent Lipid Panel    Component Value Date/Time   CHOL 128 07/26/2023 0927   TRIG 89.0 07/26/2023 0927   HDL 53.40 07/26/2023 0927   CHOLHDL 2 07/26/2023 0927   VLDL  17.8 07/26/2023 0927   LDLCALC 57 07/26/2023 0927   LDLDIRECT 82.0 02/28/2017 0725    Physical Exam:    VS:  BP 134/80   Pulse 78   Ht 6\' 2"  (1.88 m)   Wt 210 lb (95.3 kg)   SpO2 96%   BMI 26.96 kg/m     Wt Readings from Last 3 Encounters:  10/01/23 210 lb (95.3 kg)  07/31/23 217 lb (98.4 kg)  05/08/23 221 lb 9.6 oz (100.5 kg)     GEN:  Well nourished, well developed in no acute distress HEENT: Normal NECK: No JVD; No carotid bruits LYMPHATICS: No lymphadenopathy CARDIAC: RRR, no murmurs, rubs, gallops RESPIRATORY:  Clear to auscultation without rales, wheezing or rhonchi  ABDOMEN: Soft, non-tender, non-distended MUSCULOSKELETAL:  No edema; No deformity  SKIN: Warm and dry NEUROLOGIC:  Alert and oriented x 3 PSYCHIATRIC:  Normal affect   ASSESSMENT:    1. Coronary artery disease involving native coronary artery of native heart without angina pectoris   2. Hyperlipidemia, unspecified hyperlipidemia type       PLAN:    CAD: Underwent calcium score 02/09/2021, which was 1216 (89th percentile).  Denies anginal symptoms.  Lexiscan  Myoview  on 03/08/2021 showed normal perfusion, EF 63%. -Continue aspirin 81 mg daily -Continue lovastatin  40 mg.  LDL 57, at goal <70  Hyperlipidemia: On lovastatin  40 mg daily.  LDL 57 on 07/2023  Diabetes: A1c 6.9% on 07/2023.  Diet controlled  RTC in 1 year    Medication Adjustments/Labs and Tests Ordered: Current medicines are reviewed at length with the patient today.  Concerns regarding medicines are outlined above.  Orders Placed This Encounter  Procedures   EKG 12-Lead    No orders of the defined types were placed in this encounter.    Patient Instructions  Medication Instructions:  Continue current medications *If you need a refill on your cardiac medications before your next appointment, please call your pharmacy*  Lab Work: none If you have labs (blood work) drawn today and your tests are completely normal, you  will receive your results only by: [employer] (  if you have MyChart) OR A paper copy in the mail If you have any lab test that is abnormal or we need to change your treatment, we will call you to review the results.  Testing/Procedures: none  Follow-Up: At [facility], you and your health needs are our priority.  As part of our continuing mission to provide you with exceptional heart care, our providers are all part of one team.  This team includes your primary Cardiologist (physician) and Advanced Practice Providers or APPs (Physician Assistants and Nurse Practitioners) who all work together to provide you with the care you need, when you need it.  Your next appointment:   1 year(s)  Provider:   Wendie Hamburg, MD    We recommend signing up for the patient portal called "MyChart".  Sign up information is provided on this After Visit Summary.  MyChart is used to connect with patients for Virtual Visits (Telemedicine).  Patients are able to view lab/test results, encounter notes, upcoming appointments, etc.  Non-urgent messages can be sent to your provider as well.   To learn more about what you can do with MyChart, go to [email or web address].   Other Instructions none       Signed, Wendie Hamburg, MD  10/01/2023 6:59 PM    [facility]

## 2023-10-01 ENCOUNTER — Ambulatory Visit: Payer: PPO | Attending: Cardiology | Admitting: Cardiology

## 2023-10-01 VITALS — BP 134/80 | HR 78 | Ht 74.0 in | Wt 210.0 lb

## 2023-10-01 DIAGNOSIS — I2510 Atherosclerotic heart disease of native coronary artery without angina pectoris: Secondary | ICD-10-CM | POA: Diagnosis not present

## 2023-10-01 DIAGNOSIS — E785 Hyperlipidemia, unspecified: Secondary | ICD-10-CM | POA: Diagnosis not present

## 2023-10-01 NOTE — Patient Instructions (Signed)
 Medication Instructions:  Continue current medications *If you need a refill on your cardiac medications before your next appointment, please call your pharmacy*  Lab Work: none If you have labs (blood work) drawn today and your tests are completely normal, you will receive your results only by: MyChart Message (if you have MyChart) OR A paper copy in the mail If you have any lab test that is abnormal or we need to change your treatment, we will call you to review the results.  Testing/Procedures: none  Follow-Up: At [facility], you and your health needs are our priority.  As part of our continuing mission to provide you with exceptional heart care, our providers are all part of one team.  This team includes your primary Cardiologist (physician) and Advanced Practice Providers or APPs (Physician Assistants and Nurse Practitioners) who all work together to provide you with the care you need, when you need it.  Your next appointment:   1 year(s)  Provider:   Wendie Hamburg, MD    We recommend signing up for the patient portal called "MyChart".  Sign up information is provided on this After Visit Summary.  MyChart is used to connect with patients for Virtual Visits (Telemedicine).  Patients are able to view lab/test results, encounter notes, upcoming appointments, etc.  Non-urgent messages can be sent to your provider as well.   To learn more about what you can do with MyChart, go to [email or web address].   Other Instructions none

## 2023-10-09 DIAGNOSIS — M9904 Segmental and somatic dysfunction of sacral region: Secondary | ICD-10-CM | POA: Diagnosis not present

## 2023-10-09 DIAGNOSIS — M9902 Segmental and somatic dysfunction of thoracic region: Secondary | ICD-10-CM | POA: Diagnosis not present

## 2023-10-09 DIAGNOSIS — M9903 Segmental and somatic dysfunction of lumbar region: Secondary | ICD-10-CM | POA: Diagnosis not present

## 2023-10-09 DIAGNOSIS — M9901 Segmental and somatic dysfunction of cervical region: Secondary | ICD-10-CM | POA: Diagnosis not present

## 2023-10-09 DIAGNOSIS — M9905 Segmental and somatic dysfunction of pelvic region: Secondary | ICD-10-CM | POA: Diagnosis not present

## 2023-10-09 DIAGNOSIS — M5440 Lumbago with sciatica, unspecified side: Secondary | ICD-10-CM | POA: Diagnosis not present

## 2023-10-21 ENCOUNTER — Other Ambulatory Visit: Payer: Self-pay | Admitting: Internal Medicine

## 2023-10-28 DIAGNOSIS — H43391 Other vitreous opacities, right eye: Secondary | ICD-10-CM | POA: Diagnosis not present

## 2023-11-18 DIAGNOSIS — L578 Other skin changes due to chronic exposure to nonionizing radiation: Secondary | ICD-10-CM | POA: Diagnosis not present

## 2023-11-18 DIAGNOSIS — L814 Other melanin hyperpigmentation: Secondary | ICD-10-CM | POA: Diagnosis not present

## 2023-11-26 DIAGNOSIS — Z01818 Encounter for other preprocedural examination: Secondary | ICD-10-CM | POA: Diagnosis not present

## 2023-11-26 DIAGNOSIS — H43391 Other vitreous opacities, right eye: Secondary | ICD-10-CM | POA: Diagnosis not present

## 2023-11-28 ENCOUNTER — Encounter: Payer: Self-pay | Admitting: Internal Medicine

## 2023-11-28 ENCOUNTER — Ambulatory Visit (INDEPENDENT_AMBULATORY_CARE_PROVIDER_SITE_OTHER): Admitting: Internal Medicine

## 2023-11-28 VITALS — BP 116/72 | HR 73 | Temp 97.8°F | Ht 74.0 in | Wt 216.4 lb

## 2023-11-28 DIAGNOSIS — E538 Deficiency of other specified B group vitamins: Secondary | ICD-10-CM

## 2023-11-28 DIAGNOSIS — E1340 Other specified diabetes mellitus with diabetic neuropathy, unspecified: Secondary | ICD-10-CM

## 2023-11-28 DIAGNOSIS — Z125 Encounter for screening for malignant neoplasm of prostate: Secondary | ICD-10-CM

## 2023-11-28 DIAGNOSIS — E7800 Pure hypercholesterolemia, unspecified: Secondary | ICD-10-CM

## 2023-11-28 DIAGNOSIS — M25551 Pain in right hip: Secondary | ICD-10-CM | POA: Diagnosis not present

## 2023-11-28 DIAGNOSIS — Z7984 Long term (current) use of oral hypoglycemic drugs: Secondary | ICD-10-CM | POA: Diagnosis not present

## 2023-11-28 DIAGNOSIS — E1140 Type 2 diabetes mellitus with diabetic neuropathy, unspecified: Secondary | ICD-10-CM

## 2023-11-28 DIAGNOSIS — E559 Vitamin D deficiency, unspecified: Secondary | ICD-10-CM | POA: Diagnosis not present

## 2023-11-28 MED ORDER — EMPAGLIFLOZIN 25 MG PO TABS: 25.0000 mg | ORAL_TABLET | Freq: Every day | ORAL | 3 refills | Status: AC

## 2023-11-28 NOTE — Patient Instructions (Signed)
 Please take all new medication as prescribed  - the jardiance  25 mg per day  Please continue all other medications as before, and refills have been done if requested.  Please have the pharmacy call with any other refills you may need.  Please continue your efforts at being more active, low cholesterol diet, and weight control.  You are otherwise up to date with prevention measures today.  Please keep your appointments with your specialists as you may have planned  You will be contacted regarding the referral for: MRI right hip  Please make an Appointment to return in 3 months, or sooner if needed, also with Lab Appointment for testing done 3-5 days before at the FIRST FLOOR Lab (so this is for TWO appointments - please see the scheduling desk as you leave)

## 2023-11-28 NOTE — Progress Notes (Signed)
 Patient ID: Paul Tucker, male   DOB: December 17, 1950, 73 y.o.   MRN: [id number]        Chief Complaint: follow up right hip pain, neuropathy, dm, htn, hld       HPI:  Paul Tucker is a 73 y.o. male here with c/o markedly worsening now severe right hip pain, gradually worsening over the past year, has seen ortho previously and advised his arthritis was not severe.  Currently only able to walk 100 yds before has to sit due to pain.  Also has neuropathy with mild worsening distal leg paresthesias and discomfort , but declines gabapentin for now.  Pt denies chest pain, increased sob or doe, wheezing, orthopnea, PND, increased LE swelling, palpitations, dizziness or syncope.   Pt denies polydipsia, polyuria, or new focal neuro s/s.    Pt denies fever, wt loss, night sweats, loss of appetite, or other constitutional symptoms         Wt Readings from Last 3 Encounters:  11/28/23 216 lb 6.4 oz (98.2 kg)  10/01/23 210 lb (95.3 kg)  07/31/23 217 lb (98.4 kg)   BP Readings from Last 3 Encounters:  11/28/23 116/72  10/01/23 134/80  08/25/23 118/72         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   LOW BACK PAIN 01/20/2007   OBSTRUCTIVE SLEEP APNEA 04/04/2007   OTITIS MEDIA, ACUTE, BILATERAL 06/13/2009   PANCREATITIS, HX OF 01/17/2007   PSA, INCREASED 11/18/2009   RASH-NONVESICULAR 08/26/2009   SINUSITIS- ACUTE-NOS 04/04/2007   SWEATING 09/30/2007   THRUSH 04/04/2007    TONSILLECTOMY AND ADENOIDECTOMY, HX OF 01/17/2007   URI 07/25/2010   URINARY RETENTION 01/27/2009   VERTIGO 06/13/2009   Past Surgical History:  Procedure Laterality Date   CHOLECYSTECTOMY  05/22/2003   EYE SURGERY     HEMORRHOID BANDING  05/21/2013   HERNIA REPAIR  05/22/1955   INGUINAL HERNIA REPAIR Right 07/27/2020   Procedure: RIGHT INGUINAL HERNIA REPAIR WITH MESH;  Surgeon: Vanderbilt Ned, MD;  Location: [facility] OR;  Service: General;  Laterality: Right;   TONSILLECTOMY     VASECTOMY  05/21/1985    reports that he has never smoked. He has never used smokeless tobacco. He reports that he does not drink alcohol and does not use drugs. family history includes Coronary artery disease in an other family member; Diabetes in an other family member; Emphysema in his father; Heart disease in his father. Allergies  Allergen Reactions   Atrovent  [Ipratropium] Swelling    Throat swelling   Pravastatin Sodium Nausea Only    Pt does not recall this reaction   Current Outpatient Medications on File Prior to Visit  Medication Sig Dispense Refill   amantadine (SYMMETREL) 100 MG capsule Take 100 mg by mouth 2 (two) times daily.     aspirin 81 MG EC tablet Take 81 mg by mouth daily.     benzonatate  (TESSALON  PERLES) 100 MG capsule 1-2 tab by mouth  every 8 hrs as needed for cough 40 capsule 1   celecoxib  (CELEBREX ) 200 MG capsule TAKE 1 CAPSULE BY MOUTH TWICE A DAY AS NEEDED 180 capsule 1   Cholecalciferol (VITAMIN D3 PO) Take 4,000 Units by mouth.     Ciclopirox  0.77 % gel USE AS DIRECTED TOPCIALLY 45 g 1   cyclobenzaprine  (FLEXERIL ) 5 MG tablet Take 1 tablet (5 mg total) by mouth 3 (three) times daily as needed for muscle spasms. 40 tablet 2   diazepam (VALIUM) 10 MG tablet Take 10 mg by mouth daily as needed.     diclofenac Sodium (VOLTAREN) 1 % GEL Apply 4 g topically in the morning and at bedtime.     diphenhydrAMINE (BENADRYL) 25 MG tablet Take 25 mg by mouth every 6 (six) hours as needed for  allergies.     doxycycline  (VIBRA -TABS) 100 MG tablet Take 1 tablet (100 mg total) by mouth 2 (two) times daily. 42 tablet 0   finasteride (PROSCAR) 5 MG tablet Take 5 mg by mouth daily.     fluocinonide  cream (LIDEX ) 0.05 % Apply 1 Application topically 2 (two) times daily. 30 g 1   LORazepam  (ATIVAN ) 0.5 MG tablet Take 0.5 mg by mouth 2 (two) times daily as needed.     LORazepam  (ATIVAN ) 1 MG tablet Take 0.5-1 tablets (0.5-1 mg total) by mouth daily as needed. 15 tablet 0   lovastatin  (MEVACOR ) 40 MG tablet Take 1 tablet (40 mg total) by mouth at bedtime.     meclizine  (ANTIVERT ) 25 MG tablet TAKE ONE TABLET BY MOUTH THREE TIMES A DAY AS NEEDED FOR DIZZINESS 30 tablet 2   mometasone  (ELOCON ) 0.1 % cream APPLY TO AFFECTED AREA(S) DAILY 45 g 0   OLANZapine (ZYPREXA) 2.5 MG tablet Take 2.5 mg by mouth at bedtime.     ondansetron  (ZOFRAN -ODT) 4 MG disintegrating tablet Take 1 tablet (4 mg total) by mouth every 8 (eight) hours as needed for nausea or vomiting. 20 tablet 0   tamsulosin  (FLOMAX ) 0.4 MG CAPS capsule TAKE ONE CAPSULE BY MOUTH DAILY 90 capsule 3   TRINTELLIX 20 MG TABS tablet Take 20 mg by mouth daily.     zolpidem  (AMBIEN ) 10 MG tablet TAKE 1 TABLET BY MOUTH EVERY NIGHT AT BEDTIME 90 tablet 1   No current facility-administered medications on file prior to visit.        ROS:  All others reviewed and negative.  Objective        PE:  BP 116/72   Pulse 73   Temp 97.8 F (36.6 C)   Ht 6' 2 (1.88 m)   Wt 216 lb 6.4 oz (98.2 kg)   SpO2 99%   BMI 27.78 kg/m                 Constitutional: Pt appears in NAD               HENT: Head: NCAT.                Right Ear: External ear normal.                 Left Ear: External ear normal.                Eyes: . Pupils are equal, round, and reactive to light. Conjunctivae and EOM are normal               Nose: without d/c or deformity  Neck: Neck supple. Gross normal ROM               Cardiovascular: Normal rate and  regular rhythm.                 Pulmonary/Chest: Effort normal and breath sounds without rales or wheezing.                Abd:  Soft, NT, ND, + BS, no organomegaly               Neurological: Pt is alert. At baseline orientation, motor grossly intact               Skin: Skin is warm. No rashes, no other new lesions, LE edema - nonel; right hip with pain on forward flexion and abduction               Psychiatric: Pt behavior is normal without agitation   Micro: none  Cardiac tracings I have personally interpreted today:  none  Pertinent Radiological findings (summarize): none   Lab Results  Component Value Date   WBC 6.0 07/26/2023   HGB 13.4 07/26/2023   HCT 38.0 (L) 07/26/2023   PLT 223.0 07/26/2023   GLUCOSE 144 (H) 07/30/2023   CHOL 128 07/26/2023   TRIG 89.0 07/26/2023   HDL 53.40 07/26/2023   LDLDIRECT 82.0 02/28/2017   LDLCALC 57 07/26/2023   ALT 25 07/26/2023   AST 18 07/26/2023   NA 137 07/30/2023   K 4.8 07/30/2023   CL 100 07/30/2023   CREATININE 1.45 07/30/2023   BUN 26 (H) 07/30/2023   CO2 27 07/30/2023   TSH 1.74 07/26/2023   PSA 5.47 (H) 07/26/2023   HGBA1C 6.9 (H) 07/26/2023   MICROALBUR 1.2 07/26/2023   Assessment/Plan:  Paul Tucker is a 73 y.o. White or Caucasian [1] male with  has a past medical history of Acute prostatitis (04/04/2007), ADD (01/17/2007), Anxiety, DEPRESSION (01/17/2007), Dermatophytosis of groin and perianal area (05/31/2008), DIVERTICULOSIS, COLON (04/06/2007), Dysuria (01/27/2009), FLANK PAIN, LEFT (09/30/2007), Flushing (08/26/2009), GLUCOSE INTOLERANCE (04/04/2007), Gross hematuria (01/27/2009), HEMATOCHEZIA (07/26/2008), HYPERLIPIDEMIA (01/20/2007), HYPOGONADISM (06/13/2009), Impaired glucose tolerance (12/10/2010), INSOMNIA (04/04/2007), LIBIDO, DECREASED (12/02/2008), LICHEN SIMPLEX CHRONICUS (05/31/2008), LOW BACK PAIN (01/20/2007), OBSTRUCTIVE SLEEP APNEA (04/04/2007), OTITIS MEDIA, ACUTE, BILATERAL (06/13/2009), PANCREATITIS, HX OF (01/17/2007), PSA,  INCREASED (11/18/2009), RASH-NONVESICULAR (08/26/2009), SINUSITIS- ACUTE-NOS (04/04/2007), SWEATING (09/30/2007), THRUSH (04/04/2007), TONSILLECTOMY AND ADENOIDECTOMY, HX OF (01/17/2007), URI (07/25/2010), URINARY RETENTION (01/27/2009), and VERTIGO (06/13/2009).  Diabetes with neurologic complications [facility]) Lab Results  Component Value Date   HGBA1C 6.9 (H) 07/26/2023   Mild uncontrolled, pt to start jardiance  25 qd   Vitamin D  deficiency Last vitamin D  Lab Results  Component Value Date   VD25OH 47.87 07/26/2023   Stable, cont oral replacement   Hyperlipidemia Lab Results  Component Value Date   LDLCALC 57 07/26/2023   Stable, pt to continue current statin lovastatin  40 mg qhs   Right hip pain ? Underlying labral tear worsening - with marked pain without hx of significant djd - for MRI right hip  Followup: Return in about 3 months (around 02/28/2024).  Lynwood Rush, MD 11/30/2023 2:57 PM [facility]

## 2023-11-30 ENCOUNTER — Encounter: Payer: Self-pay | Admitting: Internal Medicine

## 2023-11-30 NOTE — Assessment & Plan Note (Signed)
?   Underlying labral tear worsening - with marked pain without hx of significant djd - for MRI right hip

## 2023-11-30 NOTE — Assessment & Plan Note (Signed)
 Lab Results  Component Value Date   HGBA1C 6.9 (H) 07/26/2023   Mild uncontrolled, pt to start jardiance  25 qd

## 2023-11-30 NOTE — Addendum Note (Signed)
 Addended by: NORLEEN LYNWOOD ORN on: 11/30/2023 02:59 PM   Modules accepted: Orders

## 2023-11-30 NOTE — Assessment & Plan Note (Signed)
 Lab Results  Component Value Date   LDLCALC 57 07/26/2023   Stable, pt to continue current statin lovastatin  40 mg qhs

## 2023-11-30 NOTE — Assessment & Plan Note (Signed)
 Last vitamin D Lab Results  Component Value Date   VD25OH 47.87 07/26/2023   Stable, cont oral replacement

## 2023-12-09 IMAGING — DX DG LUMBAR SPINE 2-3V
3 series · 3 of 3 positions shown · non-contrast
Comparison: None.

CLINICAL DATA: Right hip pain

EXAM:
LUMBAR SPINE - 2-3 VIEW

[l-spine ap]
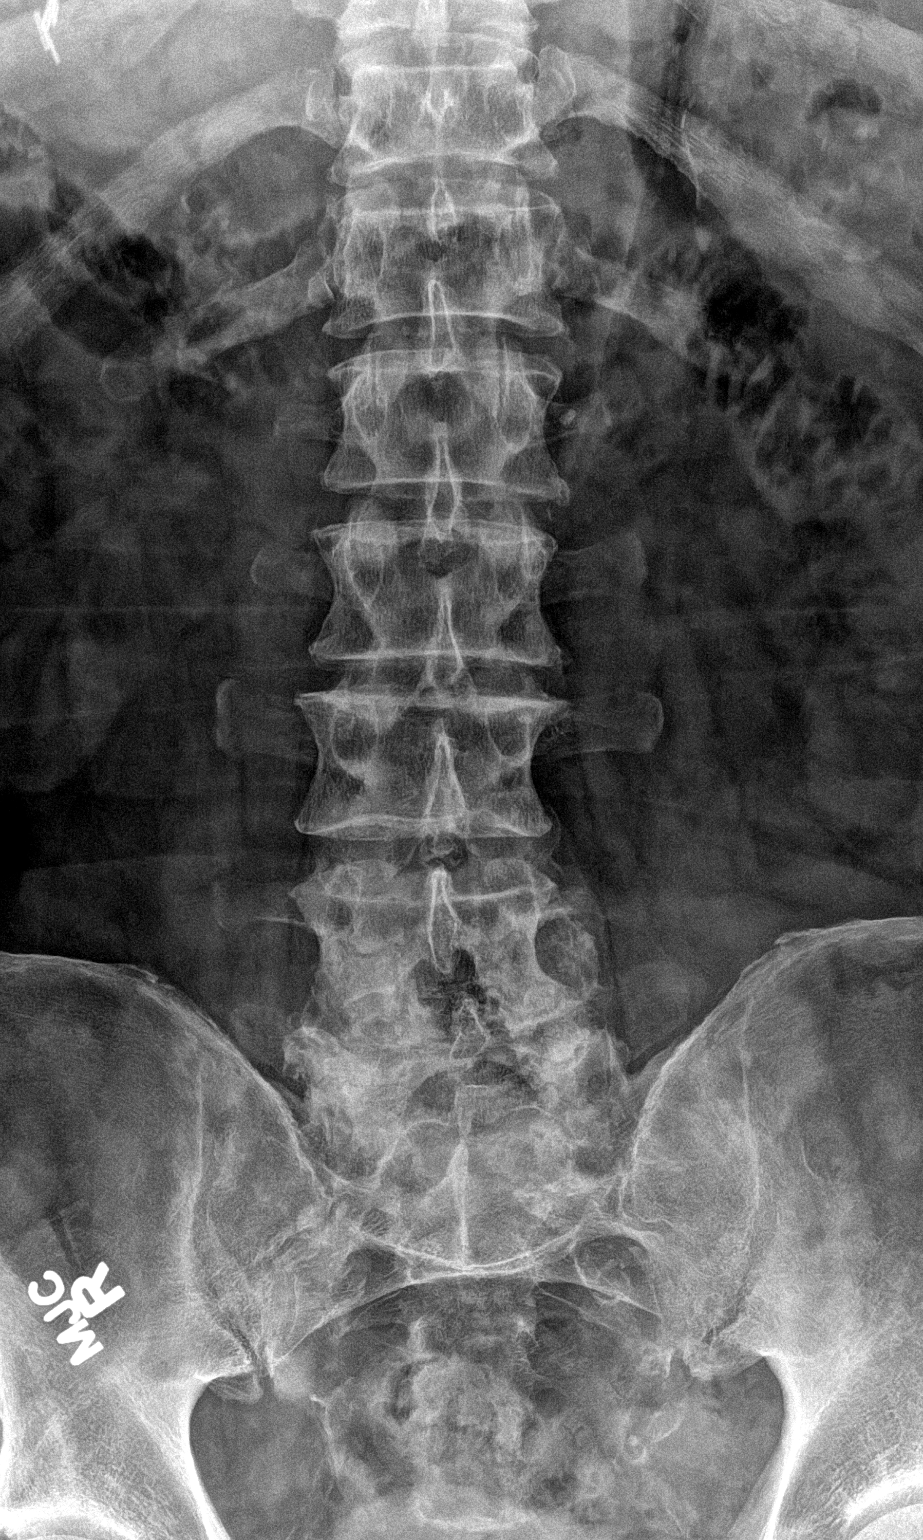

[l-spine lateral]
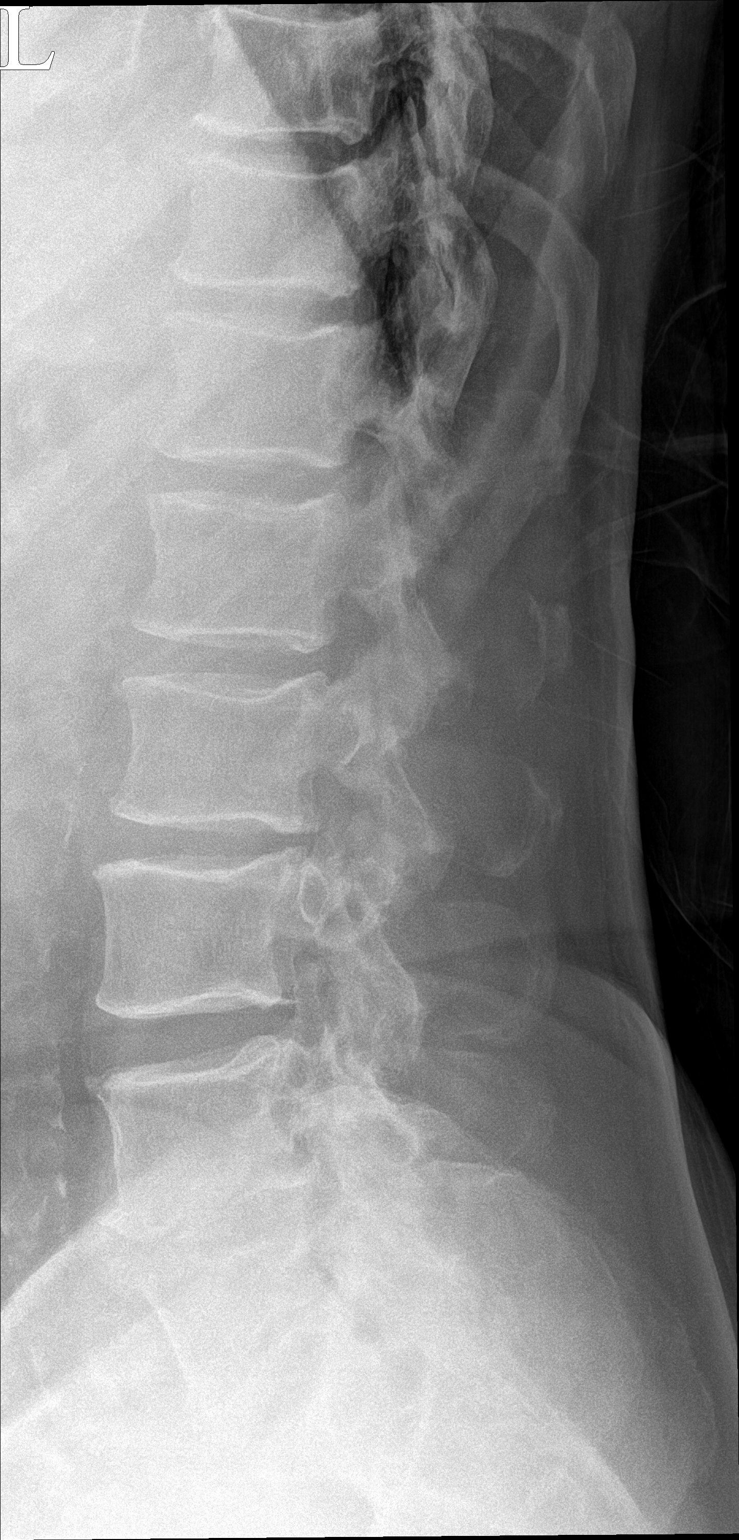

[l-spine spot]
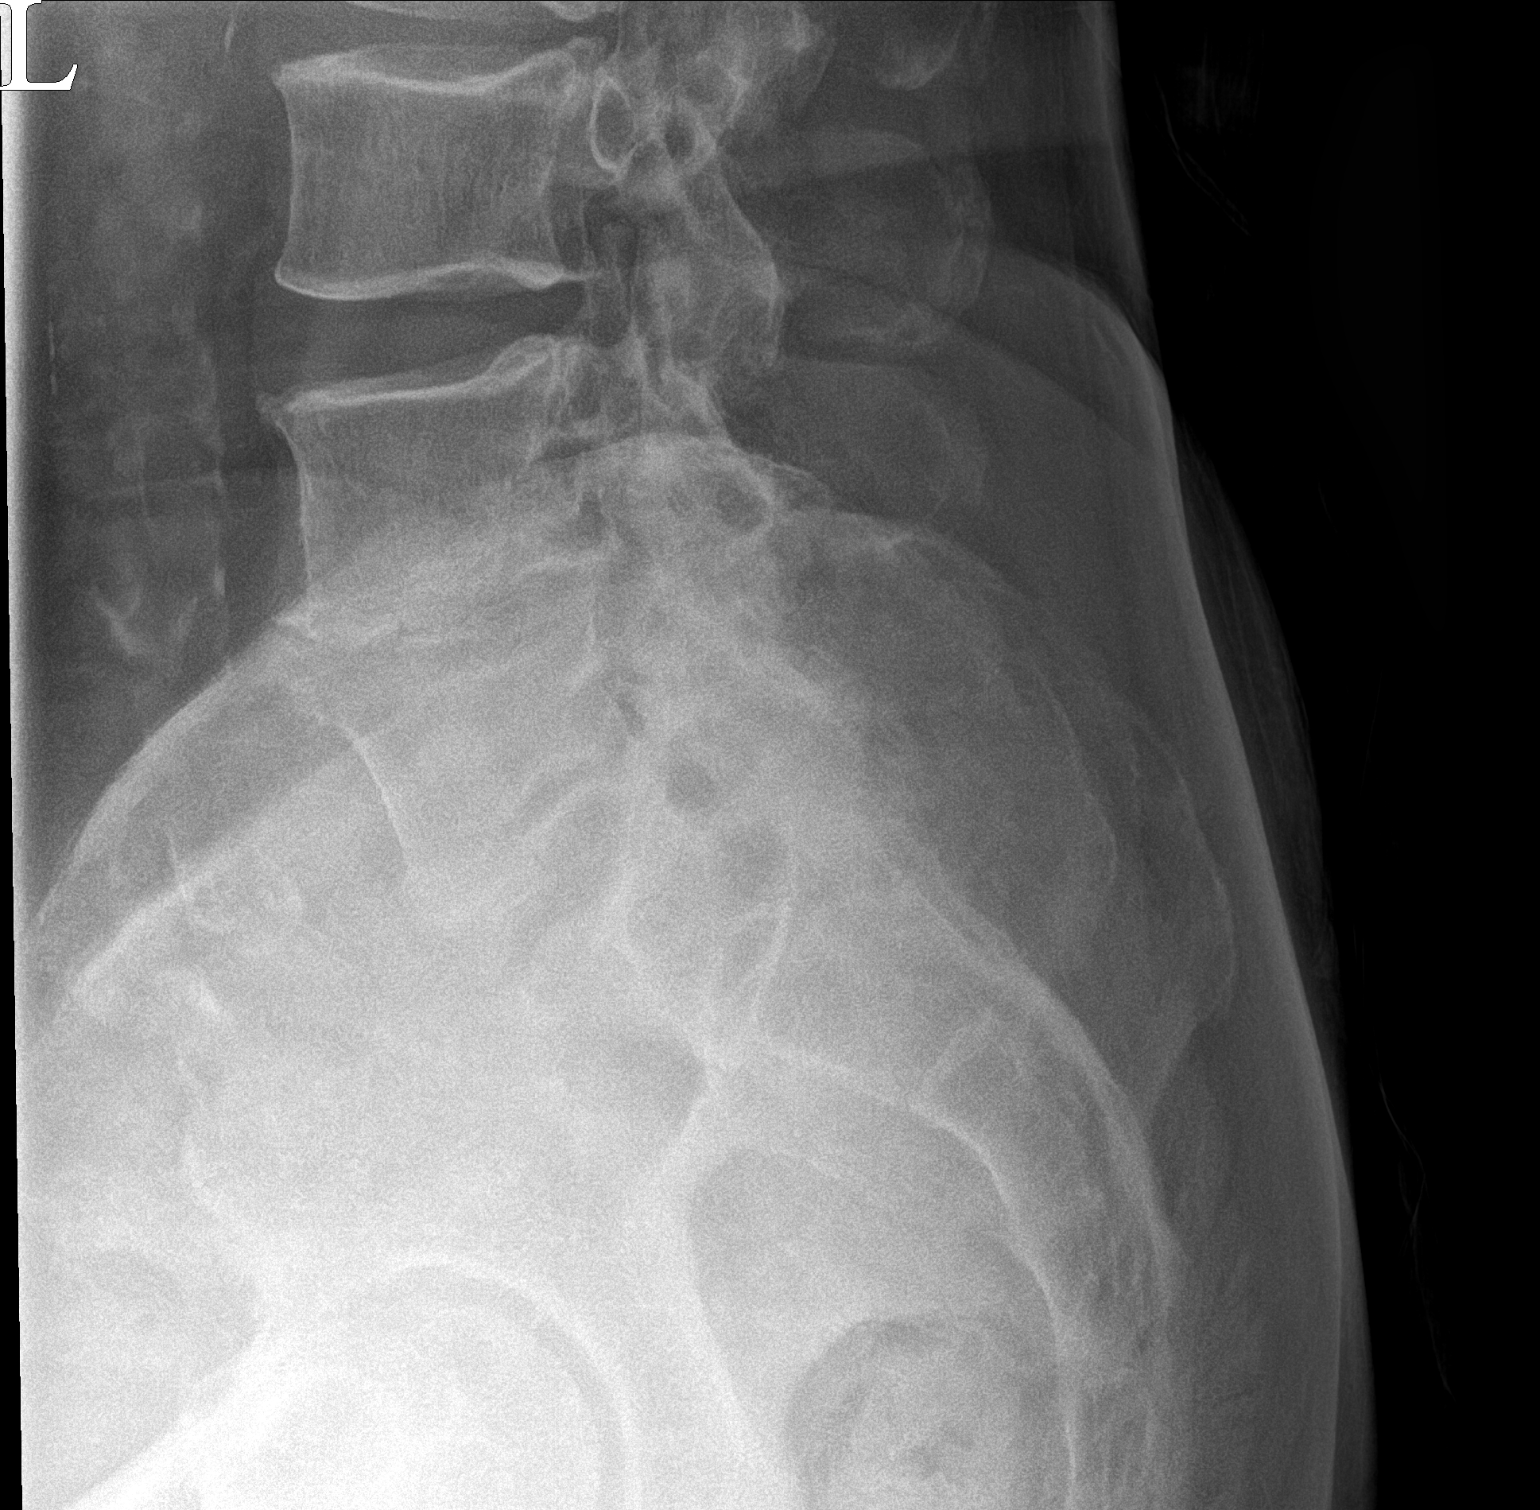

[3 of 3 positions shown; findings below may reference images not displayed]

FINDINGS: Normal lumbar lordosis. No acute fracture or listhesis of the lumbar
spine. There is intervertebral disc space narrowing and endplate
remodeling at L4-5 in keeping with changes of advanced degenerative
disc disease at this level. Facet arthrosis of L4-S1 is not well
profiled on this examination. Paraspinal soft tissues are
unremarkable.
IMPRESSION: Advanced degenerative disc and degenerative joint disease L4-5.
Potential neural impingement would be better assessed with MRI
examination if indicated.

## 2023-12-10 DIAGNOSIS — E119 Type 2 diabetes mellitus without complications: Secondary | ICD-10-CM | POA: Diagnosis not present

## 2023-12-10 DIAGNOSIS — G4733 Obstructive sleep apnea (adult) (pediatric): Secondary | ICD-10-CM | POA: Diagnosis not present

## 2023-12-10 DIAGNOSIS — H43391 Other vitreous opacities, right eye: Secondary | ICD-10-CM | POA: Diagnosis not present

## 2023-12-15 ENCOUNTER — Ambulatory Visit
Admission: RE | Admit: 2023-12-15 | Discharge: 2023-12-15 | Disposition: A | Discharge status: Home / Self Care | Source: Ambulatory Visit | Attending: Internal Medicine | Admitting: Internal Medicine

## 2023-12-15 DIAGNOSIS — M25551 Pain in right hip: Secondary | ICD-10-CM | POA: Diagnosis not present

## 2023-12-16 ENCOUNTER — Ambulatory Visit: Payer: Self-pay | Admitting: Internal Medicine

## 2023-12-18 DIAGNOSIS — H43822 Vitreomacular adhesion, left eye: Secondary | ICD-10-CM | POA: Diagnosis not present

## 2023-12-22 ENCOUNTER — Other Ambulatory Visit: Payer: Self-pay | Admitting: Internal Medicine

## 2023-12-22 DIAGNOSIS — E7800 Pure hypercholesterolemia, unspecified: Secondary | ICD-10-CM

## 2024-01-06 DIAGNOSIS — M9902 Segmental and somatic dysfunction of thoracic region: Secondary | ICD-10-CM | POA: Diagnosis not present

## 2024-01-06 DIAGNOSIS — M9901 Segmental and somatic dysfunction of cervical region: Secondary | ICD-10-CM | POA: Diagnosis not present

## 2024-01-06 DIAGNOSIS — M9903 Segmental and somatic dysfunction of lumbar region: Secondary | ICD-10-CM | POA: Diagnosis not present

## 2024-01-06 DIAGNOSIS — M9904 Segmental and somatic dysfunction of sacral region: Secondary | ICD-10-CM | POA: Diagnosis not present

## 2024-01-24 ENCOUNTER — Other Ambulatory Visit

## 2024-01-24 DIAGNOSIS — E559 Vitamin D deficiency, unspecified: Secondary | ICD-10-CM

## 2024-01-24 DIAGNOSIS — E1340 Other specified diabetes mellitus with diabetic neuropathy, unspecified: Secondary | ICD-10-CM | POA: Diagnosis not present

## 2024-01-24 DIAGNOSIS — Z125 Encounter for screening for malignant neoplasm of prostate: Secondary | ICD-10-CM | POA: Diagnosis not present

## 2024-01-24 DIAGNOSIS — E538 Deficiency of other specified B group vitamins: Secondary | ICD-10-CM | POA: Diagnosis not present

## 2024-01-24 LAB — MICROALBUMIN / CREATININE URINE RATIO
Creatinine,U: 92.2 mg/dL
Microalb Creat Ratio: 9.5 mg/g (ref 0.0–30.0)
Microalb, Ur: 0.9 mg/dL (ref 0.0–1.9)

## 2024-01-24 LAB — VITAMIN D 25 HYDROXY (VIT D DEFICIENCY, FRACTURES): VITD: 53.47 ng/mL (ref 30.00–100.00)

## 2024-01-24 LAB — BASIC METABOLIC PANEL WITH GFR
BUN: 28 mg/dL — ABNORMAL HIGH (ref 6–23)
CO2: 22 meq/L (ref 19–32)
Calcium: 9 mg/dL (ref 8.4–10.5)
Chloride: 105 meq/L (ref 96–112)
Creatinine, Ser: 1.3 mg/dL (ref 0.40–1.50)
GFR: 54.95 mL/min — ABNORMAL LOW (ref >60.00)
Glucose, Bld: 125 mg/dL — ABNORMAL HIGH (ref 70–99)
Potassium: 4 meq/L (ref 3.5–5.1)
Sodium: 139 meq/L (ref 135–145)

## 2024-01-24 LAB — LIPID PANEL
Cholesterol: 115 mg/dL (ref 0–200)
HDL: 46.5 mg/dL (ref >39.00)
LDL Cholesterol: 47 mg/dL (ref 0–99)
NonHDL: 68.73
Total CHOL/HDL Ratio: 2
Triglycerides: 111 mg/dL (ref 0.0–149.0)
VLDL: 22.2 mg/dL (ref 0.0–40.0)

## 2024-01-24 LAB — HEMOGLOBIN A1C: Hgb A1c MFr Bld: 6.7 % — ABNORMAL HIGH (ref 4.6–6.5)

## 2024-01-24 LAB — CBC WITH DIFFERENTIAL/PLATELET
Basophils Absolute: 0 K/uL (ref 0.0–0.1)
Basophils Relative: 0.5 % (ref 0.0–3.0)
Eosinophils Absolute: 0.2 K/uL (ref 0.0–0.7)
Eosinophils Relative: 4 % (ref 0.0–5.0)
HCT: 41 % (ref 39.0–52.0)
Hemoglobin: 14 g/dL (ref 13.0–17.0)
Lymphocytes Relative: 18.2 % (ref 12.0–46.0)
Lymphs Abs: 1 K/uL (ref 0.7–4.0)
MCHC: 34.1 g/dL (ref 30.0–36.0)
MCV: 99.2 fl (ref 78.0–100.0)
Monocytes Absolute: 0.4 K/uL (ref 0.1–1.0)
Monocytes Relative: 7.8 % (ref 3.0–12.0)
Neutro Abs: 3.9 K/uL (ref 1.4–7.7)
Neutrophils Relative %: 69.5 % (ref 43.0–77.0)
Platelets: 181 K/uL (ref 150.0–400.0)
RBC: 4.13 Mil/uL — ABNORMAL LOW (ref 4.22–5.81)
RDW: 12.4 % (ref 11.5–15.5)
WBC: 5.6 K/uL (ref 4.0–10.5)

## 2024-01-24 LAB — URINALYSIS, ROUTINE W REFLEX MICROSCOPIC
Bilirubin Urine: NEGATIVE
Hgb urine dipstick: NEGATIVE
Leukocytes,Ua: NEGATIVE
Nitrite: NEGATIVE
RBC / HPF: NONE SEEN (ref 0–?)
Specific Gravity, Urine: 1.02 (ref 1.000–1.030)
Total Protein, Urine: NEGATIVE
Urine Glucose: >=1000 — AB
Urobilinogen, UA: 0.2 (ref 0.0–1.0)
WBC, UA: NONE SEEN (ref 0–?)
pH: 6 (ref 5.0–8.0)

## 2024-01-24 LAB — PSA: PSA: 5.32 ng/mL — ABNORMAL HIGH (ref 0.10–4.00)

## 2024-01-24 LAB — TSH: TSH: 1.68 u[IU]/mL (ref 0.35–5.50)

## 2024-01-24 LAB — HEPATIC FUNCTION PANEL
ALT: 23 U/L (ref 0–53)
AST: 18 U/L (ref 0–37)
Albumin: 4.5 g/dL (ref 3.5–5.2)
Alkaline Phosphatase: 60 U/L (ref 39–117)
Bilirubin, Direct: 0.2 mg/dL (ref 0.0–0.3)
Total Bilirubin: 1.2 mg/dL (ref 0.2–1.2)
Total Protein: 7.1 g/dL (ref 6.0–8.3)

## 2024-01-24 LAB — VITAMIN B12: Vitamin B-12: 312 pg/mL (ref 211–911)

## 2024-01-29 DIAGNOSIS — H43822 Vitreomacular adhesion, left eye: Secondary | ICD-10-CM | POA: Diagnosis not present

## 2024-01-31 ENCOUNTER — Ambulatory Visit: Admitting: Internal Medicine

## 2024-01-31 ENCOUNTER — Encounter: Payer: Self-pay | Admitting: Internal Medicine

## 2024-01-31 VITALS — BP 124/76 | HR 69 | Temp 98.5°F | Ht 74.0 in | Wt 210.4 lb

## 2024-01-31 DIAGNOSIS — E7800 Pure hypercholesterolemia, unspecified: Secondary | ICD-10-CM | POA: Diagnosis not present

## 2024-01-31 DIAGNOSIS — E559 Vitamin D deficiency, unspecified: Secondary | ICD-10-CM | POA: Diagnosis not present

## 2024-01-31 DIAGNOSIS — E1340 Other specified diabetes mellitus with diabetic neuropathy, unspecified: Secondary | ICD-10-CM

## 2024-01-31 DIAGNOSIS — Z7984 Long term (current) use of oral hypoglycemic drugs: Secondary | ICD-10-CM

## 2024-01-31 DIAGNOSIS — E1165 Type 2 diabetes mellitus with hyperglycemia: Secondary | ICD-10-CM | POA: Diagnosis not present

## 2024-01-31 DIAGNOSIS — R972 Elevated prostate specific antigen [PSA]: Secondary | ICD-10-CM

## 2024-01-31 DIAGNOSIS — E538 Deficiency of other specified B group vitamins: Secondary | ICD-10-CM

## 2024-01-31 NOTE — Assessment & Plan Note (Signed)
 Lab Results  Component Value Date   VITAMINB12 312 01/24/2024   Stable, cont oral replacement - b12 1000 mcg qd

## 2024-01-31 NOTE — Assessment & Plan Note (Signed)
 Last vitamin D  Lab Results  Component Value Date   VD25OH 53.47 01/24/2024   Stable, cont oral replacement

## 2024-01-31 NOTE — Assessment & Plan Note (Signed)
 Lab Results  Component Value Date   HGBA1C 6.7 (H) 01/24/2024   uncontrolled, pt to continue current medical treatment jardiance  25 mg every day, declines to add mounjaro today, will work on DM diet.

## 2024-01-31 NOTE — Progress Notes (Signed)
 Patient ID: MONTA POLICE, male   DOB: 01-30-1951, 73 y.o.   MRN: [id number]        Chief Complaint: follow up HTN, HLD and DM, low b12 and D       HPI:  Paul Tucker is a 73 y.o. male here overall doing ok.  Pt denies chest pain, increased sob or doe, wheezing, orthopnea, PND, increased LE swelling, palpitations, dizziness or syncope.   Pt denies polydipsia, polyuria, or new focal neuro s/s.    Pt denies fever, wt loss, night sweats, loss of appetite, or other constitutional symptoms   Lost 4 lbs from 1 yr ago  Has eye exam sched next week. Plans to have the flu shot at the pharmacy.    Wt Readings from Last 3 Encounters:  01/31/24 210 lb 6.4 oz (95.4 kg)  11/28/23 216 lb 6.4 oz (98.2 kg)  10/01/23 210 lb (95.3 kg)   BP Readings from Last 3 Encounters:  01/31/24 124/76  11/28/23 116/72  10/01/23 134/80         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   LOW BACK PAIN 01/20/2007   OBSTRUCTIVE SLEEP APNEA 04/04/2007   OTITIS MEDIA, ACUTE, BILATERAL 06/13/2009   PANCREATITIS, HX OF 01/17/2007   PSA, INCREASED 11/18/2009   RASH-NONVESICULAR 08/26/2009   SINUSITIS- ACUTE-NOS 04/04/2007   SWEATING 09/30/2007   THRUSH 04/04/2007   TONSILLECTOMY AND ADENOIDECTOMY, HX OF 01/17/2007   URI 07/25/2010   URINARY RETENTION 01/27/2009   VERTIGO 06/13/2009   Past Surgical History:  Procedure Laterality Date   CHOLECYSTECTOMY  2005   EYE SURGERY     HEMORRHOID BANDING   05/21/2013   HERNIA REPAIR  1957   INGUINAL HERNIA REPAIR Right 07/27/2020   Procedure: RIGHT INGUINAL HERNIA REPAIR WITH MESH;  Surgeon: Vanderbilt Ned, MD;  Location: [facility] OR;  Service: General;  Laterality: Right;   TONSILLECTOMY     VASECTOMY  05/21/1985    reports that he has never smoked. He has never used smokeless tobacco. He reports that he does not drink alcohol and does not use drugs. family history includes Coronary artery disease in an other family member; Diabetes in an other family member; Emphysema in his father; Heart disease in his father. Allergies  Allergen Reactions   Atrovent  [Ipratropium] Swelling    Throat swelling   Pravastatin Sodium Nausea Only    Pt does not recall this reaction   Current Outpatient Medications on File Prior to Visit  Medication Sig Dispense Refill   amantadine (SYMMETREL) 100 MG capsule Take 100 mg by mouth 2 (two) times daily.     aspirin 81 MG EC tablet Take 81 mg by mouth daily.     benzonatate  (TESSALON  PERLES) 100 MG capsule 1-2 tab by mouth every 8 hrs as needed for cough 40 capsule 1   celecoxib  (CELEBREX ) 200 MG capsule TAKE 1 CAPSULE BY MOUTH TWICE A DAY AS NEEDED 180 capsule 1   Cholecalciferol (  VITAMIN D3 PO) Take 4,000 Units by mouth.     Ciclopirox  0.77 % gel USE AS DIRECTED TOPCIALLY 45 g 1   cyclobenzaprine  (FLEXERIL ) 5 MG tablet Take 1 tablet (5 mg total) by mouth 3 (three) times daily as needed for muscle spasms. 40 tablet 2   diazepam (VALIUM) 10 MG tablet Take 10 mg by mouth daily as needed.     diclofenac Sodium (VOLTAREN) 1 % GEL Apply 4 g topically in the morning and at bedtime.     diphenhydrAMINE (BENADRYL) 25 MG tablet Take 25 mg by mouth every 6 (six) hours as needed for allergies.     doxycycline  (VIBRA -TABS) 100 MG tablet Take 1 tablet (100 mg total) by mouth 2 (two) times daily. 42 tablet 0   empagliflozin  (JARDIANCE ) 25 MG TABS tablet Take 1 tablet (25 mg total) by mouth daily before breakfast. 90 tablet 3    finasteride (PROSCAR) 5 MG tablet Take 5 mg by mouth daily.     fluocinonide  cream (LIDEX ) 0.05 % Apply 1 Application topically 2 (two) times daily. 30 g 1   LORazepam  (ATIVAN ) 0.5 MG tablet Take 0.5 mg by mouth 2 (two) times daily as needed.     LORazepam  (ATIVAN ) 1 MG tablet Take 0.5-1 tablets (0.5-1 mg total) by mouth daily as needed. 15 tablet 0   lovastatin  (MEVACOR ) 40 MG tablet TAKE TWO TABLETS BY MOUTH AT BEDTIME 180 tablet 1   meclizine  (ANTIVERT ) 25 MG tablet TAKE ONE TABLET BY MOUTH THREE TIMES A DAY AS NEEDED FOR DIZZINESS 30 tablet 2   mometasone  (ELOCON ) 0.1 % cream APPLY TO AFFECTED AREA(S) DAILY 45 g 0   OLANZapine (ZYPREXA) 2.5 MG tablet Take 2.5 mg by mouth at bedtime.     ondansetron  (ZOFRAN -ODT) 4 MG disintegrating tablet Take 1 tablet (4 mg total) by mouth every 8 (eight) hours as needed for nausea or vomiting. 20 tablet 0   tamsulosin  (FLOMAX ) 0.4 MG CAPS capsule TAKE ONE CAPSULE BY MOUTH DAILY 90 capsule 3   TRINTELLIX 20 MG TABS tablet Take 20 mg by mouth daily.     zolpidem  (AMBIEN ) 10 MG tablet TAKE 1 TABLET BY MOUTH EVERY NIGHT AT BEDTIME 90 tablet 1   No current facility-administered medications on file prior to visit.        ROS:  All others reviewed and negative.  Objective        PE:  BP 124/76   Pulse 69   Temp 98.5 F (36.9 C)   Ht 6' 2 (1.88 m)   Wt 210 lb 6.4 oz (95.4 kg)   SpO2 96%   BMI 27.01 kg/m                 Constitutional: Pt appears in NAD               HENT: Head: NCAT.                Right Ear: External ear normal.                 Left Ear: External ear normal.                Eyes: . Pupils are equal, round, and reactive to light. Conjunctivae and EOM are normal               Nose: without d/c or deformity               Neck: Neck supple. Gross normal  ROM               Cardiovascular: Normal rate and regular rhythm.                 Pulmonary/Chest: Effort normal and breath sounds without rales or wheezing.                Abd:  Soft,  NT, ND, + BS, no organomegaly               Neurological: Pt is alert. At baseline orientation, motor grossly intact               Skin: Skin is warm. No rashes, no other new lesions, LE edema - none               Psychiatric: Pt behavior is normal without agitation   Micro: none  Cardiac tracings I have personally interpreted today:  none  Pertinent Radiological findings (summarize): none   Lab Results  Component Value Date   WBC 5.6 01/24/2024   HGB 14.0 01/24/2024   HCT 41.0 01/24/2024   PLT 181.0 01/24/2024   GLUCOSE 125 (H) 01/24/2024   CHOL 115 01/24/2024   TRIG 111.0 01/24/2024   HDL 46.50 01/24/2024   LDLDIRECT 82.0 02/28/2017   LDLCALC 47 01/24/2024   ALT 23 01/24/2024   AST 18 01/24/2024   NA 139 01/24/2024   K 4.0 01/24/2024   CL 105 01/24/2024   CREATININE 1.30 01/24/2024   BUN 28 (H) 01/24/2024   CO2 22 01/24/2024   TSH 1.68 01/24/2024   PSA 5.32 (H) 01/24/2024   HGBA1C 6.7 (H) 01/24/2024   MICROALBUR 0.9 01/24/2024   Assessment/Plan:  Paul Tucker is a 73 y.o. White or Caucasian [1] male with  has a past medical history of Acute prostatitis (04/04/2007), ADD (01/17/2007), Anxiety, DEPRESSION (01/17/2007), Dermatophytosis of groin and perianal area (05/31/2008), DIVERTICULOSIS, COLON (04/06/2007), Dysuria (01/27/2009), FLANK PAIN, LEFT (09/30/2007), Flushing (08/26/2009), GLUCOSE INTOLERANCE (04/04/2007), Gross hematuria (01/27/2009), HEMATOCHEZIA (07/26/2008), HYPERLIPIDEMIA (01/20/2007), HYPOGONADISM (06/13/2009), Impaired glucose tolerance (12/10/2010), INSOMNIA (04/04/2007), LIBIDO, DECREASED (12/02/2008), LICHEN SIMPLEX CHRONICUS (05/31/2008), LOW BACK PAIN (01/20/2007), OBSTRUCTIVE SLEEP APNEA (04/04/2007), OTITIS MEDIA, ACUTE, BILATERAL (06/13/2009), PANCREATITIS, HX OF (01/17/2007), PSA, INCREASED (11/18/2009), RASH-NONVESICULAR (08/26/2009), SINUSITIS- ACUTE-NOS (04/04/2007), SWEATING (09/30/2007), THRUSH (04/04/2007), TONSILLECTOMY AND ADENOIDECTOMY, HX OF (01/17/2007), URI  (07/25/2010), URINARY RETENTION (01/27/2009), and VERTIGO (06/13/2009).  B12 deficiency Lab Results  Component Value Date   VITAMINB12 312 01/24/2024   Stable, cont oral replacement - b12 1000 mcg qd   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HCC) Lab Results  Component Value Date   HGBA1C 6.7 (H) 01/24/2024   uncontrolled, pt to continue current medical treatment jardiance  25 mg every day, declines to add mounjaro today, will work on DM diet.   Hyperlipidemia Lab Results  Component Value Date   LDLCALC 47 01/24/2024   Stable, pt to continue current statin lovastatin  80 qhs   Vitamin D  deficiency Last vitamin D  Lab Results  Component Value Date   VD25OH 53.47 01/24/2024   Stable, cont oral replacement  Followup: Return in about 6 months (around 07/30/2024).  Lynwood Rush, MD 01/31/2024 9:06 AM [facility] - [facility]

## 2024-01-31 NOTE — Patient Instructions (Signed)
 Please remember your flu shot at pharmacy  Please continue all other medications as before, and refills have been done if requested.  Please have the pharmacy call with any other refills you may need.  Please continue your efforts at being more active, low cholesterol diet, and weight control.  Please keep your appointments with your specialists as you may have planned  Please make an Appointment to return in 6 months, or sooner if needed, also with Lab Appointment for testing done 3-5 days before at the FIRST FLOOR Lab (so this is for TWO appointments - please see the scheduling desk as you leave)

## 2024-01-31 NOTE — Assessment & Plan Note (Signed)
 Lab Results  Component Value Date   LDLCALC 47 01/24/2024   Stable, pt to continue current statin lovastatin  80 qhs

## 2024-02-01 ENCOUNTER — Other Ambulatory Visit: Payer: Self-pay | Admitting: Internal Medicine

## 2024-02-04 LAB — HM DIABETES EYE EXAM

## 2024-02-13 DIAGNOSIS — M9904 Segmental and somatic dysfunction of sacral region: Secondary | ICD-10-CM | POA: Diagnosis not present

## 2024-02-13 DIAGNOSIS — M9903 Segmental and somatic dysfunction of lumbar region: Secondary | ICD-10-CM | POA: Diagnosis not present

## 2024-02-13 DIAGNOSIS — M9901 Segmental and somatic dysfunction of cervical region: Secondary | ICD-10-CM | POA: Diagnosis not present

## 2024-02-13 DIAGNOSIS — M9902 Segmental and somatic dysfunction of thoracic region: Secondary | ICD-10-CM | POA: Diagnosis not present

## 2024-02-14 ENCOUNTER — Encounter: Payer: Self-pay | Admitting: Internal Medicine

## 2024-02-17 ENCOUNTER — Other Ambulatory Visit: Payer: Self-pay | Admitting: Internal Medicine

## 2024-02-17 ENCOUNTER — Other Ambulatory Visit: Payer: Self-pay

## 2024-02-17 DIAGNOSIS — M9904 Segmental and somatic dysfunction of sacral region: Secondary | ICD-10-CM | POA: Diagnosis not present

## 2024-02-17 DIAGNOSIS — M9902 Segmental and somatic dysfunction of thoracic region: Secondary | ICD-10-CM | POA: Diagnosis not present

## 2024-02-17 DIAGNOSIS — M9901 Segmental and somatic dysfunction of cervical region: Secondary | ICD-10-CM | POA: Diagnosis not present

## 2024-02-17 DIAGNOSIS — M9903 Segmental and somatic dysfunction of lumbar region: Secondary | ICD-10-CM | POA: Diagnosis not present

## 2024-02-18 ENCOUNTER — Other Ambulatory Visit (HOSPITAL_COMMUNITY): Payer: Self-pay

## 2024-02-18 ENCOUNTER — Telehealth: Payer: Self-pay

## 2024-02-18 NOTE — Telephone Encounter (Signed)
 Pharmacy Patient Advocate Encounter   Received notification from CoverMyMeds that prior authorization for Cyclobenzaprine  HCl 5MG  tablets  is required/requested.   Insurance verification completed.   The patient is insured through [facility] [employer] .   Per test claim: PA required; PA submitted to above mentioned insurance via Latent Key/confirmation #/EOC [facility] Status is pending

## 2024-02-20 ENCOUNTER — Ambulatory Visit
Admission: EM | Admit: 2024-02-20 | Discharge: 2024-02-20 | Disposition: A | Discharge status: Home / Self Care | Attending: Family Medicine | Admitting: Family Medicine

## 2024-02-20 DIAGNOSIS — R112 Nausea with vomiting, unspecified: Secondary | ICD-10-CM | POA: Diagnosis not present

## 2024-02-20 DIAGNOSIS — A084 Viral intestinal infection, unspecified: Secondary | ICD-10-CM | POA: Diagnosis not present

## 2024-02-20 LAB — POC SOFIA SARS ANTIGEN FIA: SARS Coronavirus 2 Ag: NEGATIVE

## 2024-02-20 MED ORDER — ONDANSETRON 8 MG PO TBDP: 8.0000 mg | ORAL_TABLET | Freq: Three times a day (TID) | ORAL | 0 refills | Status: AC | PRN

## 2024-02-20 NOTE — Discharge Instructions (Addendum)

## 2024-02-20 NOTE — ED Triage Notes (Signed)
 Pt c/o fever, n/v started ~12pm-last dose tylenol  ~3p-NAD-steady gait

## 2024-02-20 NOTE — ED Provider Notes (Signed)
 [facility]  Note:  This document was prepared using Conservation officer, historic buildings and may include unintentional dictation errors.  MRN: [id number] DOB: 25-Apr-1951  Subjective:   Paul Tucker is a 73 y.o. male presenting for 1 day history of fever, nausea, vomiting, upset stomach.  No fever, bloody stools, constipation, diarrhea, recent antibiotic use, hospitalizations or [place] distance travel.  Has not eaten raw foods, drank unfiltered water.  No history of GI disorders including Crohn's, IBS, ulcerative colitis.  Patient and his wife do report that symptoms started after he ate at a restaurant.  He last took Tylenol  in the afternoon and vomited the medication.  Has a history of diverticulosis.  No current facility-administered medications for this encounter.  Current Outpatient Medications:    amantadine (SYMMETREL) 100 MG capsule, Take 100 mg by mouth 2 (two) times daily., Disp: , Rfl:    aspirin 81 MG EC tablet, Take 81 mg by mouth daily., Disp: , Rfl:    benzonatate  (TESSALON  PERLES) 100 MG capsule, 1-2 tab by mouth every 8 hrs as needed for cough, Disp: 40 capsule, Rfl: 1   celecoxib  (CELEBREX ) 200 MG capsule, TAKE 1 CAPSULE BY MOUTH 2 TIMES A DAY AS NEEDED, Disp: 180 capsule, Rfl: 1   Cholecalciferol (VITAMIN D3 PO), Take 4,000 Units by mouth., Disp: , Rfl:    Ciclopirox  0.77 % gel, USE AS DIRECTED TOPCIALLY, Disp: 45 g, Rfl: 1   cyclobenzaprine  (FLEXERIL ) 5 MG tablet, TAKE ONE TABLET BY MOUTH THREE TIMES A DAY AS NEEDED FOR MUSCLE SPASMS, Disp: 40 tablet, Rfl: 2   diazepam (VALIUM) 10 MG tablet, Take 10 mg by mouth daily as needed., Disp: , Rfl:    diclofenac Sodium (VOLTAREN) 1 % GEL, Apply 4 g topically in the morning and at bedtime., Disp: , Rfl:    diphenhydrAMINE (BENADRYL) 25 MG tablet, Take 25 mg by mouth every 6 (six) hours as needed for allergies., Disp: , Rfl:    doxycycline  (VIBRA -TABS) 100 MG tablet, Take 1 tablet (100 mg total) by mouth 2 (two)  times daily., Disp: 42 tablet, Rfl: 0   empagliflozin  (JARDIANCE ) 25 MG TABS tablet, Take 1 tablet (25 mg total) by mouth daily before breakfast., Disp: 90 tablet, Rfl: 3   finasteride (PROSCAR) 5 MG tablet, Take 5 mg by mouth daily., Disp: , Rfl:    fluocinonide  cream (LIDEX ) 0.05 %, Apply 1 Application topically 2 (two) times daily., Disp: 30 g, Rfl: 1   LORazepam  (ATIVAN ) 0.5 MG tablet, Take 0.5 mg by mouth 2 (two) times daily as needed., Disp: , Rfl:    LORazepam  (ATIVAN ) 1 MG tablet, Take 0.5-1 tablets (0.5-1 mg total) by mouth daily as needed., Disp: 15 tablet, Rfl: 0   lovastatin  (MEVACOR ) 40 MG tablet, TAKE TWO TABLETS BY MOUTH AT BEDTIME, Disp: 180 tablet, Rfl: 1   meclizine  (ANTIVERT ) 25 MG tablet, TAKE ONE TABLET BY MOUTH THREE TIMES A DAY AS NEEDED FOR DIZZINESS, Disp: 30 tablet, Rfl: 2   mometasone  (ELOCON ) 0.1 % cream, APPLY TO AFFECTED AREA(S) DAILY, Disp: 45 g, Rfl: 0   OLANZapine (ZYPREXA) 2.5 MG tablet, Take 2.5 mg by mouth at bedtime., Disp: , Rfl:    ondansetron  (ZOFRAN -ODT) 4 MG disintegrating tablet, Take 1 tablet (4 mg total) by mouth every 8 (eight) hours as needed for nausea or vomiting., Disp: 20 tablet, Rfl: 0   tamsulosin  (FLOMAX ) 0.4 MG CAPS capsule, TAKE ONE CAPSULE BY MOUTH DAILY, Disp: 90 capsule, Rfl: 3  TRINTELLIX 20 MG TABS tablet, Take 20 mg by mouth daily., Disp: , Rfl:    zolpidem  (AMBIEN ) 10 MG tablet, TAKE 1 TABLET BY MOUTH EVERY NIGHT AT BEDTIME, Disp: 90 tablet, Rfl: 1   Allergies  Allergen Reactions   Atrovent  [Ipratropium] Swelling    Throat swelling   Pravastatin Sodium Nausea Only    Pt does not recall this reaction    Past Medical History:  Diagnosis Date   Acute prostatitis 04/04/2007   ADD 01/17/2007   Anxiety    DEPRESSION 01/17/2007   Treatment Resident Depression   Dermatophytosis of groin and perianal area 05/31/2008   DIVERTICULOSIS, COLON 04/06/2007   Dysuria 01/27/2009   FLANK PAIN, LEFT 09/30/2007   Flushing 08/26/2009   GLUCOSE  INTOLERANCE 04/04/2007   Gross hematuria 01/27/2009   HEMATOCHEZIA 07/26/2008   HYPERLIPIDEMIA 01/20/2007   HYPOGONADISM 06/13/2009   Impaired glucose tolerance 12/10/2010   INSOMNIA 04/04/2007   LIBIDO, DECREASED 12/02/2008   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   LOW BACK PAIN 01/20/2007   OBSTRUCTIVE SLEEP APNEA 04/04/2007   OTITIS MEDIA, ACUTE, BILATERAL 06/13/2009   PANCREATITIS, HX OF 01/17/2007   PSA, INCREASED 11/18/2009   RASH-NONVESICULAR 08/26/2009   SINUSITIS- ACUTE-NOS 04/04/2007   SWEATING 09/30/2007   THRUSH 04/04/2007   TONSILLECTOMY AND ADENOIDECTOMY, HX OF 01/17/2007   URI 07/25/2010   URINARY RETENTION 01/27/2009   VERTIGO 06/13/2009     Past Surgical History:  Procedure Laterality Date   CHOLECYSTECTOMY  2005   EYE SURGERY     HEMORRHOID BANDING  05/21/2013   HERNIA REPAIR  1957   INGUINAL HERNIA REPAIR Right 07/27/2020   Procedure: RIGHT INGUINAL HERNIA REPAIR WITH MESH;  Surgeon: Vanderbilt Ned, MD;  Location: [facility] OR;  Service: General;  Laterality: Right;   TONSILLECTOMY     VASECTOMY  05/21/1985    Family History  Problem Relation Age of Onset   Emphysema Father    Heart disease Father    Coronary artery disease Other        male, 1st degree relative   Diabetes Other        1st degree relative   Colon cancer Neg Hx    Esophageal cancer Neg Hx    Rectal cancer Neg Hx    Stomach cancer Neg Hx     Social History   Tobacco Use   Smoking status: Never   Smokeless tobacco: Never  Vaping Use   Vaping status: Never Used  Substance Use Topics   Alcohol use: Never    Alcohol/week: 0.0 standard drinks of alcohol   Drug use: No    ROS   Objective:   Vitals: BP 109/73 (BP Location: Left Arm)   Pulse (!) 107   Temp (!) 100.6 F (38.1 C) (Oral)   Resp 20   SpO2 94%   Physical Exam Constitutional:      General: He is not in acute distress.    Appearance: Normal appearance. He is well-developed and normal weight. He is not ill-appearing, toxic-appearing or  diaphoretic.  HENT:     Right Ear: External ear normal.     Left Ear: External ear normal.     Nose: Nose normal.     Mouth/Throat:     Mouth: Mucous membranes are moist.  Eyes:     General: No scleral icterus.       Right eye: No discharge.        Left eye: No discharge.     Extraocular Movements: Extraocular movements  intact.     Conjunctiva/sclera: Conjunctivae normal.  Cardiovascular:     Rate and Rhythm: Normal rate and regular rhythm.     Heart sounds: No murmur heard.    No friction rub. No gallop.  Pulmonary:     Effort: No respiratory distress.     Breath sounds: No wheezing or rales.  Abdominal:     General: Bowel sounds are increased. There is no distension.     Palpations: Abdomen is soft. There is no mass.     Tenderness: There is no abdominal tenderness. There is no right CVA tenderness, left CVA tenderness, guarding or rebound.  Skin:    General: Skin is warm and dry.  Neurological:     Mental Status: He is alert and oriented to person, place, and time.     Results for orders placed or performed during the hospital encounter of 02/20/24 (from the past 24 hours)  POC SARS Coronavirus Ag     Status: None   Collection Time: 02/20/24  5:13 PM  Result Value Ref Range   SARS Coronavirus 2 Ag Negative Negative    Assessment and Plan :   PDMP not reviewed this encounter.  1. Viral gastroenteritis   2. Nausea and vomiting, unspecified vomiting type    No signs of an acute abdomen.  Will manage for suspected viral gastroenteritis with supportive care.  Recommended patient hydrate well, eat light meals and maintain electrolytes.  Will use Zofran  and Imodium for nausea, vomiting and diarrhea. Counseled patient on potential for adverse effects with medications prescribed/recommended today, ER and return-to-clinic precautions discussed, patient verbalized understanding.    Christopher Savannah, [place] 02/20/24 [id number]

## 2024-02-21 ENCOUNTER — Other Ambulatory Visit (HOSPITAL_COMMUNITY): Payer: Self-pay

## 2024-02-21 ENCOUNTER — Ambulatory Visit (HOSPITAL_COMMUNITY)

## 2024-02-21 NOTE — Telephone Encounter (Signed)
 Pharmacy Patient Advocate Encounter  Received notification from [facility] [employer] that Prior Authorization for Cyclobenzaprine  HCl 5MG  tablets   has been APPROVED from 02/19/2024 to 03/18/2024. Ran test claim, Copay is $0.00. This test claim was processed through [facility]- copay amounts may vary at other pharmacies due to pharmacy/plan contracts, or as the patient moves through the different stages of their insurance plan.   PA #/Case ID/Reference #: [id number]

## 2024-02-26 DIAGNOSIS — R972 Elevated prostate specific antigen [PSA]: Secondary | ICD-10-CM | POA: Diagnosis not present

## 2024-03-04 DIAGNOSIS — R399 Unspecified symptoms and signs involving the genitourinary system: Secondary | ICD-10-CM | POA: Diagnosis not present

## 2024-03-04 DIAGNOSIS — R3912 Poor urinary stream: Secondary | ICD-10-CM | POA: Diagnosis not present

## 2024-03-04 DIAGNOSIS — R972 Elevated prostate specific antigen [PSA]: Secondary | ICD-10-CM | POA: Diagnosis not present

## 2024-03-04 DIAGNOSIS — N401 Enlarged prostate with lower urinary tract symptoms: Secondary | ICD-10-CM | POA: Diagnosis not present

## 2024-03-06 ENCOUNTER — Other Ambulatory Visit: Payer: Self-pay | Admitting: Urology

## 2024-03-06 DIAGNOSIS — R972 Elevated prostate specific antigen [PSA]: Secondary | ICD-10-CM

## 2024-03-13 ENCOUNTER — Encounter: Payer: Self-pay | Admitting: Internal Medicine

## 2024-03-18 DIAGNOSIS — L570 Actinic keratosis: Secondary | ICD-10-CM | POA: Diagnosis not present

## 2024-03-18 DIAGNOSIS — M545 Low back pain, unspecified: Secondary | ICD-10-CM | POA: Diagnosis not present

## 2024-03-30 DIAGNOSIS — M545 Low back pain, unspecified: Secondary | ICD-10-CM | POA: Diagnosis not present

## 2024-04-01 ENCOUNTER — Ambulatory Visit: Payer: Self-pay

## 2024-04-01 NOTE — Telephone Encounter (Signed)
 FYI Only or Action Required?: FYI only for provider: appointment scheduled on 04/07/24 refused sooner appointment .  Patient was last seen in primary care on 01/31/2024 by Norleen Lynwood ORN, MD.  Called Nurse Triage reporting Back Pain.  Symptoms began several weeks ago.  Interventions attempted: Rest, hydration, or home remedies.  Symptoms are: unchanged.  Triage Disposition: See PCP When Office is Open (Within 3 Days)  Patient/caregiver understands and will follow disposition?:    Copied from CRM #[id number]. Topic: Clinical - Red Word Triage >> Apr 01, 2024 10:02 AM Drema MATSU wrote: [facility] that prompted transfer to Nurse Triage: Patient wife stated that patient has constant pain in his upper back and is not sure if it is his lungs or not. He has on and off pain for about 2 weeks. Reason for Disposition  [1] Age > 50 AND [2] no history of prior similar back pain  Answer Assessment - Initial Assessment Questions Additional info: Wife called this morning and scheduled an OV on 04/07/24 for back pain, this clinical research associate called patient for triage. He is not interested in sooner appointment and would like to keep scheduled appointment on 04/07/24, he states he will call back for worsening pain and/or any new symptoms.    1. ONSET: When did the pain begin? (e.g., minutes, hours, days)     2 weeks ago  2. LOCATION: Where does it hurt? (upper, mid or lower back)     Middle  3. SEVERITY: How bad is the pain?  (e.g., Scale 1-10; mild, moderate, or severe)     Feels like pulled muscle  4. PATTERN: Is the pain constant? (e.g., yes, no; constant, intermittent)      Intermittent  5. RADIATION: Does the pain shoot into your legs or somewhere else?     denies 6. CAUSE:  What do you think is causing the back pain?      Thinks he pulled muscle in back at the gym a few weeks ago  7. BACK OVERUSE:  Any recent lifting of heavy objects, strenuous work or exercise?     Gym workouts 8.  MEDICINES: What have you taken so far for the pain? (e.g., nothing, acetaminophen , NSAIDS)      9. NEUROLOGIC SYMPTOMS: Do you have any weakness, numbness, or problems with bowel/bladder control?     denies 10. OTHER SYMPTOMS: Do you have any other symptoms? (e.g., fever, abdomen pain, burning with urination, blood in urine)       denies 11. PREGNANCY: Is there any chance you are pregnant? When was your last menstrual period?  Protocols used: Back Pain-A-AH

## 2024-04-05 ENCOUNTER — Other Ambulatory Visit (HOSPITAL_BASED_OUTPATIENT_CLINIC_OR_DEPARTMENT_OTHER): Payer: Self-pay

## 2024-04-06 ENCOUNTER — Other Ambulatory Visit: Payer: Self-pay

## 2024-04-06 ENCOUNTER — Other Ambulatory Visit (HOSPITAL_BASED_OUTPATIENT_CLINIC_OR_DEPARTMENT_OTHER): Payer: Self-pay

## 2024-04-06 MED ORDER — LORAZEPAM 1 MG PO TABS
0.5000 mg | ORAL_TABLET | Freq: Every day | ORAL | 0 refills | Status: AC | PRN
  Filled 2024-04-06: qty 15, 15d supply, fill #0

## 2024-04-07 ENCOUNTER — Ambulatory Visit: Admitting: Internal Medicine

## 2024-04-07 ENCOUNTER — Ambulatory Visit
Admission: RE | Admit: 2024-04-07 | Discharge: 2024-04-07 | Disposition: A | Discharge status: Home / Self Care | Source: Ambulatory Visit | Attending: Urology | Admitting: Urology

## 2024-04-07 DIAGNOSIS — N400 Enlarged prostate without lower urinary tract symptoms: Secondary | ICD-10-CM | POA: Diagnosis not present

## 2024-04-07 DIAGNOSIS — R972 Elevated prostate specific antigen [PSA]: Secondary | ICD-10-CM

## 2024-04-07 MED ORDER — GADOPICLENOL 0.5 MMOL/ML IV SOLN
9.0000 mL | Freq: Once | INTRAVENOUS | Status: AC | PRN
  Administered 2024-04-07: 9 mL via INTRAVENOUS

## 2024-04-08 DIAGNOSIS — M545 Low back pain, unspecified: Secondary | ICD-10-CM | POA: Diagnosis not present

## 2024-04-15 DIAGNOSIS — M545 Low back pain, unspecified: Secondary | ICD-10-CM | POA: Diagnosis not present

## 2024-04-22 DIAGNOSIS — M545 Low back pain, unspecified: Secondary | ICD-10-CM | POA: Diagnosis not present

## 2024-04-23 ENCOUNTER — Other Ambulatory Visit: Payer: Self-pay | Admitting: Internal Medicine

## 2024-04-29 DIAGNOSIS — M545 Low back pain, unspecified: Secondary | ICD-10-CM | POA: Diagnosis not present

## 2024-05-08 ENCOUNTER — Ambulatory Visit: Payer: PPO

## 2024-05-08 ENCOUNTER — Encounter: Payer: Self-pay | Admitting: Internal Medicine

## 2024-05-08 VITALS — BP 118/70 | HR 76 | Ht 74.0 in | Wt 215.8 lb

## 2024-05-08 DIAGNOSIS — Z0000 Encounter for general adult medical examination without abnormal findings: Secondary | ICD-10-CM | POA: Diagnosis not present

## 2024-05-08 NOTE — Patient Instructions (Signed)
 Paul Tucker,  Thank you for taking the time for your Medicare Wellness Visit. I appreciate your continued commitment to your health goals. Please review the care plan we discussed, and feel free to reach out if I can assist you further.  Please note that Annual Wellness Visits do not include a physical exam. Some assessments may be limited, especially if the visit was conducted virtually. If needed, we may recommend an in-person follow-up with your provider.  Ongoing Care Seeing your primary care provider every 3 to 6 months helps us  monitor your health and provide consistent, personalized care. Next office visit on 07/21/2024.  Aim for 30 minutes of exercise or brisk walking, 6-8 glasses of water, and 5 servings of fruits and vegetables each day.   Referrals If a referral was made during today's visit and you haven't received any updates within two weeks, please contact the referred provider directly to check on the status.  Recommended Screenings:  Health Maintenance  Topic Date Due   Medicare Annual Wellness Visit  05/07/2024   Colon Cancer Screening  07/05/2024   Hemoglobin A1C  07/23/2024   Complete foot exam   07/30/2024   Yearly kidney function blood test for diabetes  01/23/2025   Yearly kidney health urinalysis for diabetes  01/23/2025   Eye exam for diabetics  02/03/2025   DTaP/Tdap/Td vaccine (3 - Td or Tdap) 08/02/2033   Pneumococcal Vaccine for age over 64  Completed   Flu Shot  Completed   Hepatitis C Screening  Completed   Zoster (Shingles) Vaccine  Completed   Meningitis B Vaccine  Aged Out   COVID-19 Vaccine  Discontinued       05/08/2023    3:40 PM  Advanced Directives  Does Patient Have a Medical Advance Directive? Yes  Type of Estate Agent of [place];Living will  Copy of Healthcare Power of Attorney in Chart? No - copy requested    Vision: Annual vision screenings are recommended for early detection of glaucoma, cataracts, and diabetic  retinopathy. These exams can also reveal signs of chronic conditions such as diabetes and high blood pressure.  Dental: Annual dental screenings help detect early signs of oral cancer, gum disease, and other conditions linked to overall health, including heart disease and diabetes.  Please see the attached documents for additional preventive care recommendations.

## 2024-05-08 NOTE — Progress Notes (Signed)
 "   No chief complaint on file.    Subjective:   Paul Tucker is a 73 y.o. male who presents for a Medicare Annual Wellness Visit.  Visit info / Clinical Intake: Medicare Wellness Visit Type:: Subsequent Annual Wellness Visit Persons participating in visit and providing information:: patient Medicare Wellness Visit Mode:: In-person (required for WTM) Interpreter Needed?: No Pre-visit prep was completed: yes AWV questionnaire completed by patient prior to visit?: yes Date:: 05/04/24 Living arrangements:: (Patient-Rptd) lives with spouse/significant other Patient's Overall Health Status Rating: (Patient-Rptd) very good Typical amount of pain: (Patient-Rptd) some Does pain affect daily life?: (Patient-Rptd) no Are you currently prescribed opioids?: no  Dietary Habits and Nutritional Risks How many meals a day?: (Patient-Rptd) 3 Eats fruit and vegetables daily?: (Patient-Rptd) yes Most meals are obtained by: (Patient-Rptd) preparing own meals In the last 2 weeks, have you had any of the following?: none Diabetic:: no  Functional Status Activities of Daily Living (to include ambulation/medication): (Patient-Rptd) Independent Ambulation: Independent with device- listed below Home Assistive Devices/Equipment: Eyeglasses Medication Administration: (Patient-Rptd) Independent Home Management (perform basic housework or laundry): (Patient-Rptd) Independent Manage your own finances?: (Patient-Rptd) yes Primary transportation is: (Patient-Rptd) driving Concerns about vision?: no *vision screening is required for WTM* Concerns about hearing?: no  Fall Screening Falls in the past year?: (Patient-Rptd) 1 Number of falls in past year: (Patient-Rptd) 1 Was there an injury with Fall?: (Patient-Rptd) 0 Fall Risk Category Calculator: (Patient-Rptd) 2 Patient Fall Risk Level: (Patient-Rptd) Moderate Fall Risk  Fall Risk Patient at Risk for Falls Due to: No Fall Risks Fall risk Follow up:  Falls evaluation completed; Falls prevention discussed  Home and Transportation Safety: All rugs have non-skid backing?: (Patient-Rptd) yes All stairs or steps have railings?: (Patient-Rptd) yes Grab bars in the bathtub or shower?: (Patient-Rptd) yes Have non-skid surface in bathtub or shower?: (Patient-Rptd) yes Good home lighting?: (Patient-Rptd) yes Regular seat belt use?: (Patient-Rptd) yes Hospital stays in the last year:: (Patient-Rptd) no  Cognitive Assessment Difficulty concentrating, remembering, or making decisions? : yes (memory) Will 6CIT or Mini Cog be Completed: yes What year is it?: 0 points What month is it?: 0 points Give patient an address phrase to remember (5 components): [address] [place] About what time is it?: 0 points Count backwards from 20 to 1: 2 points (11) Say the months of the year in reverse: 0 points Repeat the address phrase from earlier: 0 points 6 CIT Score: 2 points  Advance Directives (For Healthcare) Does Patient Have a Medical Advance Directive?: Yes Type of Advance Directive: Healthcare Power of Powers; Living will; Out of facility DNR (pink MOST or yellow form) Copy of Healthcare Power of Attorney in Chart?: No - copy requested Copy of Living Will in Chart?: No - copy requested Out of facility DNR (pink MOST or yellow form) in Chart? (Ambulatory ONLY): No - copy requested  Reviewed/Updated  Reviewed/Updated: Reviewed All (Medical, Surgical, Family, Medications, Allergies, Care Teams, Patient Goals)    Allergies (verified) Atrovent  [ipratropium] and Pravastatin sodium   Current Medications (verified) Outpatient Encounter Medications as of 05/08/2024  Medication Sig   amantadine (SYMMETREL) 100 MG capsule Take 100 mg by mouth 2 (two) times daily.   aspirin 81 MG EC tablet Take 81 mg by mouth daily.   benzonatate  (TESSALON  PERLES) 100 MG capsule 1-2 tab by mouth every 8 hrs as needed for cough   celecoxib  (CELEBREX ) 200 MG  capsule TAKE 1 CAPSULE BY MOUTH 2 TIMES A DAY AS NEEDED  Cholecalciferol (VITAMIN D3 PO) Take 4,000 Units by mouth.   Ciclopirox  0.77 % gel USE AS DIRECTED TOPCIALLY   cyclobenzaprine  (FLEXERIL ) 5 MG tablet TAKE ONE TABLET BY MOUTH THREE TIMES A DAY AS NEEDED FOR MUSCLE SPASMS   diazepam (VALIUM) 10 MG tablet Take 10 mg by mouth daily as needed.   diclofenac Sodium (VOLTAREN) 1 % GEL Apply 4 g topically in the morning and at bedtime.   diphenhydrAMINE (BENADRYL) 25 MG tablet Take 25 mg by mouth every 6 (six) hours as needed for allergies.   doxycycline  (VIBRA -TABS) 100 MG tablet Take 1 tablet (100 mg total) by mouth 2 (two) times daily.   empagliflozin  (JARDIANCE ) 25 MG TABS tablet Take 1 tablet (25 mg total) by mouth daily before breakfast.   finasteride (PROSCAR) 5 MG tablet Take 5 mg by mouth daily.   fluocinonide  cream (LIDEX ) 0.05 % Apply 1 Application topically 2 (two) times daily.   LORazepam  (ATIVAN ) 0.5 MG tablet Take 0.5 mg by mouth 2 (two) times daily as needed.   LORazepam  (ATIVAN ) 1 MG tablet Take 0.5-1 tablets (0.5-1 mg total) by mouth daily as needed.   lovastatin  (MEVACOR ) 40 MG tablet TAKE TWO TABLETS BY MOUTH AT BEDTIME   meclizine  (ANTIVERT ) 25 MG tablet TAKE ONE TABLET BY MOUTH THREE TIMES A DAY AS NEEDED FOR DIZZINESS   mometasone  (ELOCON ) 0.1 % cream APPLY TO AFFECTED AREA(S) DAILY   OLANZapine (ZYPREXA) 2.5 MG tablet Take 2.5 mg by mouth at bedtime.   ondansetron  (ZOFRAN -ODT) 8 MG disintegrating tablet Take 1 tablet (8 mg total) by mouth every 8 (eight) hours as needed for nausea or vomiting.   tamsulosin  (FLOMAX ) 0.4 MG CAPS capsule TAKE ONE CAPSULE BY MOUTH DAILY   TRINTELLIX 20 MG TABS tablet Take 20 mg by mouth daily.   zolpidem  (AMBIEN ) 10 MG tablet TAKE 1 TABLET BY MOUTH EVERY NIGHT AT BEDTIME   No facility-administered encounter medications on file as of 05/08/2024.    History: Past Medical History:  Diagnosis Date   Acute prostatitis 04/04/2007   ADD  01/17/2007   Anxiety    DEPRESSION 01/17/2007   Treatment Resident Depression   Dermatophytosis of groin and perianal area 05/31/2008   DIVERTICULOSIS, COLON 04/06/2007   Dysuria 01/27/2009   FLANK PAIN, LEFT 09/30/2007   Flushing 08/26/2009   GLUCOSE INTOLERANCE 04/04/2007   Gross hematuria 01/27/2009   HEMATOCHEZIA 07/26/2008   HYPERLIPIDEMIA 01/20/2007   HYPOGONADISM 06/13/2009   Impaired glucose tolerance 12/10/2010   INSOMNIA 04/04/2007   LIBIDO, DECREASED 12/02/2008   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   LOW BACK PAIN 01/20/2007   OBSTRUCTIVE SLEEP APNEA 04/04/2007   OTITIS MEDIA, ACUTE, BILATERAL 06/13/2009   PANCREATITIS, HX OF 01/17/2007   PSA, INCREASED 11/18/2009   RASH-NONVESICULAR 08/26/2009   SINUSITIS- ACUTE-NOS 04/04/2007   SWEATING 09/30/2007   THRUSH 04/04/2007   TONSILLECTOMY AND ADENOIDECTOMY, HX OF 01/17/2007   URI 07/25/2010   URINARY RETENTION 01/27/2009   VERTIGO 06/13/2009   Past Surgical History:  Procedure Laterality Date   CHOLECYSTECTOMY  2005   EYE SURGERY     HEMORRHOID BANDING  05/21/2013   HERNIA REPAIR  1957   INGUINAL HERNIA REPAIR Right 07/27/2020   Procedure: RIGHT INGUINAL HERNIA REPAIR WITH MESH;  Surgeon: Vanderbilt Ned, MD;  Location: [facility] OR;  Service: General;  Laterality: Right;   TONSILLECTOMY     VASECTOMY  05/21/1985   Family History  Problem Relation Age of Onset   Emphysema Father    Heart disease Father  Coronary artery disease Other        male, 1st degree relative   Diabetes Other        1st degree relative   Colon cancer Neg Hx    Esophageal cancer Neg Hx    Rectal cancer Neg Hx    Stomach cancer Neg Hx    Social History   Occupational History   Occupation: Estate Agent   Occupation: RETIRED  Tobacco Use   Smoking status: Never   Smokeless tobacco: Never  Vaping Use   Vaping status: Never Used  Substance and Sexual Activity   Alcohol use: Never    Alcohol/week: 0.0 standard drinks of alcohol   Drug use: No   Sexual  activity: Not on file   Tobacco Counseling Counseling given: Not Answered  SDOH Screenings   Food Insecurity: No Food Insecurity (05/04/2024)  Housing: Unknown (05/04/2024)  Transportation Needs: No Transportation Needs (05/04/2024)  Utilities: Not At Risk (05/08/2024)  Alcohol Screen: Low Risk (11/25/2023)  Depression (PHQ2-9): Medium Risk (05/08/2024)  Financial Resource Strain: Low Risk (05/04/2024)  Physical Activity: Inactive (05/04/2024)  Social Connections: Unknown (05/04/2024)  Stress: No Stress Concern Present (05/04/2024)  Tobacco Use: Low Risk (05/08/2024)  Health Literacy: Adequate Health Literacy (05/08/2024)   See flowsheets for full screening details  Depression Screen PHQ 2 & 9 Depression Scale- Over the past 2 weeks, how often have you been bothered by any of the following problems? Little interest or pleasure in doing things: 0 Feeling down, depressed, or hopeless (PHQ Adolescent also includes...irritable): 0 PHQ-2 Total Score: 0 Trouble falling or staying asleep, or sleeping too much: 3 (staying asleep/due to using bathroom) Feeling tired or having little energy: 0 Poor appetite or overeating (PHQ Adolescent also includes...weight loss): 0 Feeling bad about yourself - or that you are a failure or have let yourself or your family down: 0 Trouble concentrating on things, such as reading the newspaper or watching television (PHQ Adolescent also includes...like school work): 2 Moving or speaking so slowly that other people could have noticed. Or the opposite - being so fidgety or restless that you have been moving around a lot more than usual: 0 Thoughts that you would be better off dead, or of hurting yourself in some way: 0 PHQ-9 Total Score: 5 If you checked off any problems, how difficult have these problems made it for you to do your work, take care of things at home, or get along with other people?: Not difficult at all  Depression Treatment Depression  Interventions/Treatment : EYV7-0 Score <4 Follow-up Not Indicated     Goals Addressed               This Visit's Progress     Patient Stated (pt-stated)        Would like to lose 10 lbs/2025             Objective:    Today's Vitals   05/08/24 1504  BP: 118/70  Pulse: 76  SpO2: 97%  Weight: 215 lb 12.8 oz (97.9 kg)  Height: 6' 2 (1.88 m)   Body mass index is 27.71 kg/m.  Hearing/Vision screen Hearing Screening - Comments:: Denies hearing difficulties   Vision Screening - Comments:: Wears eyeglasses/ UTD/ [facility] Immunizations and Health Maintenance Health Maintenance  Topic Date Due   Colonoscopy  07/05/2024   HEMOGLOBIN A1C  07/23/2024   FOOT EXAM  07/30/2024   Diabetic kidney evaluation - eGFR measurement  01/23/2025   Diabetic kidney evaluation -  Urine ACR  01/23/2025   OPHTHALMOLOGY EXAM  02/03/2025   Medicare Annual Wellness (AWV)  05/08/2025   DTaP/Tdap/Td (3 - Td or Tdap) 08/02/2033   Pneumococcal Vaccine: 50+ Years  Completed   Influenza Vaccine  Completed   Hepatitis C Screening  Completed   Zoster Vaccines- Shingrix  Completed   Meningococcal B Vaccine  Aged Out   COVID-19 Vaccine  Discontinued        Assessment/Plan:  This is a routine wellness examination for Paul Tucker.  Patient Care Team: Norleen Lynwood ORN, MD as PCP - General (Internal Medicine) Kate Lonni CROME, MD as PCP - Cardiology (Cardiology) Cleotilde Sewer, OD as Consulting Physician (Optometry) Vincente Grip, MD as Consulting Physician (Psychiatry) Lavonia Lye, MD as Consulting Physician (Ophthalmology)  I have personally reviewed and noted the following in the patients chart:   Medical and social history Use of alcohol, tobacco or illicit drugs  Current medications and supplements including opioid prescriptions. Functional ability and status Nutritional status Physical activity Advanced directives List of other physicians Hospitalizations, surgeries, and ER  visits in previous 12 months Vitals Screenings to include cognitive, depression, and falls Referrals and appointments  No orders of the defined types were placed in this encounter.  In addition, I have reviewed and discussed with patient certain preventive protocols, quality metrics, and best practice recommendations. A written personalized care plan for preventive services as well as general preventive health recommendations were provided to patient.   Finlay Godbee L Adyline Huberty, CMA   05/08/2024   Return in 1 year (on 05/08/2025).  After Visit Summary: (MyChart) Due to this being a telephonic visit, the after visit summary with patients personalized plan was offered to patient via MyChart   Nurse Notes: No voiced or noted concerns at this time "

## 2024-05-28 ENCOUNTER — Other Ambulatory Visit: Payer: Self-pay

## 2024-05-28 ENCOUNTER — Other Ambulatory Visit: Payer: Self-pay | Admitting: Internal Medicine

## 2024-05-28 DIAGNOSIS — E7800 Pure hypercholesterolemia, unspecified: Secondary | ICD-10-CM

## 2024-06-09 ENCOUNTER — Encounter: Payer: Self-pay | Admitting: Internal Medicine

## 2024-06-09 ENCOUNTER — Ambulatory Visit

## 2024-06-09 ENCOUNTER — Telehealth: Payer: Self-pay

## 2024-06-09 VITALS — Ht 74.0 in | Wt 217.0 lb

## 2024-06-09 DIAGNOSIS — Z1211 Encounter for screening for malignant neoplasm of colon: Secondary | ICD-10-CM

## 2024-06-09 MED ORDER — NA SULFATE-K SULFATE-MG SULF 17.5-3.13-1.6 GM/177ML PO SOLN: 1.0000 | Freq: Once | ORAL | 0 refills | Status: AC

## 2024-06-09 NOTE — Progress Notes (Signed)
 Pre visit completed in person; Patient verified name, DOB, and address; No egg or soy allergy known to patient;  No issues known to pt with past sedation with any surgeries or procedures; Patient denies ever being told they had issues or difficulty with intubation;  No FH of Malignant Hyperthermia; Pt is not on diet pills; Pt is not on home 02;  Pt is not on blood thinners;  Pt denies issues with constipation;  No A fib or A flutter; Have any cardiac testing pending--NO Insurance verified during PV appt--- HTA Medicare Pt can ambulate without assistance;  Pt denies use of chewing tobacco Discussed diabetic/weight loss medication holds; Discussed NSAID holds; Checked BMI to be less than 50; Pt instructed to use [employer] or GoodRx for a price reduction on prep;  Patient's chart reviewed by Norleen Schillings CNRA prior to previsit and patient appropriate for the [facility];  Pre visit completed and red dot placed by patient's name on their procedure day (on provider's schedule);  Instructions sent to MyChart as well as printed and given to patient at time of PV appt;

## 2024-06-09 NOTE — Telephone Encounter (Signed)
 Dr.Dorsey, This patient came into the office to day for his PV appt for his colon recall.  He is questioning if it is medically necessary to have his colonoscopy.  He is unsure he wants to go through with the procedure since he is not having any gi issues, does not have a family hx of colon issues, and is not really wanting to have to do the prep.   Please advise if he has to go through with this procedure or is he medically cleared to cancel his procedure with you.  Please/thank you Bre, PV RN   Side note- patient has requested that the answer to this is sent to him via MyChart rather than a phone call if possible;

## 2024-06-10 NOTE — Telephone Encounter (Signed)
 Incoming call from pts spouse regarding colonoscopy. Spouse requesting a call back to discuss further.  Please advise. Thank you.

## 2024-06-11 NOTE — Telephone Encounter (Signed)
 Called and left a message for patient to call back to the office if needed;  Called and spoke with patient's wife, DPR verified, questions answered and advised her to call back to the office or sent a MyChart message if further questions/concerns arise;

## 2024-06-18 ENCOUNTER — Telehealth: Payer: Self-pay | Admitting: Internal Medicine

## 2024-06-18 NOTE — Telephone Encounter (Signed)
 Good morning Dr. Federico,    I received a call from this patient wife stating that they would like to reschedule due to the incoming snow we are getting this weekend. Patient was scheduled for February the 3 rd. Patient has been rescheduled for February the 9 th. Please advise.    Thank you.

## 2024-06-23 ENCOUNTER — Encounter: Admitting: Internal Medicine

## 2024-06-23 ENCOUNTER — Ambulatory Visit: Admitting: Internal Medicine

## 2024-06-29 ENCOUNTER — Encounter: Admitting: Internal Medicine

## 2024-07-21 ENCOUNTER — Ambulatory Visit: Admitting: Internal Medicine

## 2024-07-21 ENCOUNTER — Ambulatory Visit: Admitting: Family Medicine

## 2024-07-31 ENCOUNTER — Ambulatory Visit: Admitting: Internal Medicine

## 2024-10-01 ENCOUNTER — Ambulatory Visit: Admitting: Cardiology
